# Patient Record
Sex: Female | Born: 1935
Health system: Southern US, Community
[De-identification: ages and names within clinical notes are randomized; demographics above are authoritative.]

## PROBLEM LIST (undated history)

## (undated) DIAGNOSIS — I10 Essential (primary) hypertension: Secondary | ICD-10-CM

## (undated) DIAGNOSIS — R609 Edema, unspecified: Secondary | ICD-10-CM

## (undated) DIAGNOSIS — I509 Heart failure, unspecified: Secondary | ICD-10-CM

## (undated) DIAGNOSIS — N189 Chronic kidney disease, unspecified: Secondary | ICD-10-CM

## (undated) DIAGNOSIS — E669 Obesity, unspecified: Secondary | ICD-10-CM

## (undated) DIAGNOSIS — R0609 Other forms of dyspnea: Secondary | ICD-10-CM

## (undated) DIAGNOSIS — E785 Hyperlipidemia, unspecified: Secondary | ICD-10-CM

## (undated) DIAGNOSIS — I4891 Unspecified atrial fibrillation: Secondary | ICD-10-CM

## (undated) DIAGNOSIS — K219 Gastro-esophageal reflux disease without esophagitis: Secondary | ICD-10-CM

## (undated) DIAGNOSIS — E119 Type 2 diabetes mellitus without complications: Secondary | ICD-10-CM

## (undated) DIAGNOSIS — R0989 Other specified symptoms and signs involving the circulatory and respiratory systems: Secondary | ICD-10-CM

## (undated) DIAGNOSIS — M25569 Pain in unspecified knee: Secondary | ICD-10-CM

## (undated) DIAGNOSIS — J45909 Unspecified asthma, uncomplicated: Secondary | ICD-10-CM

## (undated) HISTORY — DX: Hyperlipidemia, unspecified: E78.5

## (undated) HISTORY — DX: Unspecified atrial fibrillation: I48.91

## (undated) HISTORY — PX: ABDOMINAL HYSTERECTOMY: SHX81

## (undated) HISTORY — DX: Heart failure, unspecified: I50.9

## (undated) HISTORY — DX: Gastro-esophageal reflux disease without esophagitis: K21.9

## (undated) HISTORY — DX: Essential (primary) hypertension: I10

## (undated) HISTORY — DX: Edema, unspecified: R60.9

## (undated) HISTORY — DX: Chronic kidney disease, unspecified: N18.9

## (undated) HISTORY — DX: Unspecified asthma, uncomplicated: J45.909

## (undated) HISTORY — DX: Other specified symptoms and signs involving the circulatory and respiratory systems: R09.89

## (undated) HISTORY — DX: Type 2 diabetes mellitus without complications: E11.9

## (undated) HISTORY — DX: Obesity, unspecified: E66.9

## (undated) HISTORY — DX: Pain in unspecified knee: M25.569

## (undated) HISTORY — PX: KNEE SURGERY: SHX244

## (undated) HISTORY — DX: Other forms of dyspnea: R06.09

---

## 1998-02-16 ENCOUNTER — Encounter: Payer: Self-pay | Admitting: Pulmonary Disease

## 1998-12-14 ENCOUNTER — Encounter: Admission: RE | Admit: 1998-12-14 | Discharge: 1998-12-14 | Payer: Self-pay | Admitting: Family Medicine

## 1998-12-14 ENCOUNTER — Encounter: Payer: Self-pay | Admitting: Family Medicine

## 1999-01-04 ENCOUNTER — Other Ambulatory Visit: Admission: RE | Admit: 1999-01-04 | Discharge: 1999-01-04 | Payer: Self-pay | Admitting: Family Medicine

## 1999-03-29 ENCOUNTER — Encounter: Payer: Self-pay | Admitting: Pulmonary Disease

## 1999-03-29 ENCOUNTER — Ambulatory Visit: Admission: RE | Admit: 1999-03-29 | Discharge: 1999-03-29 | Payer: Self-pay | Admitting: Pulmonary Disease

## 1999-04-05 ENCOUNTER — Encounter: Payer: Self-pay | Admitting: Pulmonary Disease

## 2000-04-24 ENCOUNTER — Other Ambulatory Visit: Admission: RE | Admit: 2000-04-24 | Discharge: 2000-04-24 | Payer: Self-pay | Admitting: Family Medicine

## 2000-05-23 ENCOUNTER — Encounter (INDEPENDENT_AMBULATORY_CARE_PROVIDER_SITE_OTHER): Payer: Self-pay | Admitting: Specialist

## 2000-05-23 ENCOUNTER — Other Ambulatory Visit: Admission: RE | Admit: 2000-05-23 | Discharge: 2000-05-23 | Payer: Self-pay | Admitting: Obstetrics and Gynecology

## 2000-06-10 ENCOUNTER — Ambulatory Visit (HOSPITAL_COMMUNITY): Admission: RE | Admit: 2000-06-10 | Discharge: 2000-06-10 | Payer: Self-pay | Admitting: Obstetrics and Gynecology

## 2000-06-10 ENCOUNTER — Encounter (INDEPENDENT_AMBULATORY_CARE_PROVIDER_SITE_OTHER): Payer: Self-pay

## 2000-07-04 ENCOUNTER — Encounter: Payer: Self-pay | Admitting: Obstetrics and Gynecology

## 2000-07-09 ENCOUNTER — Inpatient Hospital Stay (HOSPITAL_COMMUNITY): Admission: RE | Admit: 2000-07-09 | Discharge: 2000-07-12 | Payer: Self-pay | Admitting: Obstetrics and Gynecology

## 2000-07-09 ENCOUNTER — Encounter (INDEPENDENT_AMBULATORY_CARE_PROVIDER_SITE_OTHER): Payer: Self-pay

## 2000-09-09 ENCOUNTER — Encounter: Admission: RE | Admit: 2000-09-09 | Discharge: 2000-09-09 | Payer: Self-pay | Admitting: General Surgery

## 2000-09-09 ENCOUNTER — Encounter: Payer: Self-pay | Admitting: General Surgery

## 2002-02-04 ENCOUNTER — Encounter: Payer: Self-pay | Admitting: Orthopaedic Surgery

## 2002-02-04 ENCOUNTER — Encounter: Payer: Self-pay | Admitting: Family Medicine

## 2002-02-04 ENCOUNTER — Encounter: Admission: RE | Admit: 2002-02-04 | Discharge: 2002-02-04 | Payer: Self-pay | Admitting: Orthopaedic Surgery

## 2002-02-06 ENCOUNTER — Ambulatory Visit (HOSPITAL_BASED_OUTPATIENT_CLINIC_OR_DEPARTMENT_OTHER): Admission: RE | Admit: 2002-02-06 | Discharge: 2002-02-06 | Payer: Self-pay | Admitting: Orthopaedic Surgery

## 2002-02-16 ENCOUNTER — Encounter: Admission: RE | Admit: 2002-02-16 | Discharge: 2002-05-17 | Payer: Self-pay | Admitting: Orthopaedic Surgery

## 2002-05-27 ENCOUNTER — Encounter: Admission: RE | Admit: 2002-05-27 | Discharge: 2002-06-10 | Payer: Self-pay | Admitting: Orthopaedic Surgery

## 2003-01-08 ENCOUNTER — Other Ambulatory Visit: Admission: RE | Admit: 2003-01-08 | Discharge: 2003-01-08 | Payer: Self-pay | Admitting: Obstetrics and Gynecology

## 2003-02-17 ENCOUNTER — Encounter: Payer: Self-pay | Admitting: Family Medicine

## 2003-03-18 ENCOUNTER — Inpatient Hospital Stay (HOSPITAL_COMMUNITY): Admission: RE | Admit: 2003-03-18 | Discharge: 2003-03-19 | Payer: Self-pay | Admitting: General Surgery

## 2004-07-18 ENCOUNTER — Ambulatory Visit (HOSPITAL_COMMUNITY): Admission: RE | Admit: 2004-07-18 | Discharge: 2004-07-18 | Payer: Self-pay | Admitting: Family Medicine

## 2004-07-18 ENCOUNTER — Encounter: Payer: Self-pay | Admitting: Family Medicine

## 2004-07-21 ENCOUNTER — Encounter: Payer: Self-pay | Admitting: Family Medicine

## 2006-07-02 ENCOUNTER — Encounter (INDEPENDENT_AMBULATORY_CARE_PROVIDER_SITE_OTHER): Payer: Self-pay | Admitting: *Deleted

## 2006-07-02 LAB — CONVERTED CEMR LAB
Glucose, Bld: 126 mg/dL
Hgb A1c MFr Bld: 6.7 %

## 2007-10-23 ENCOUNTER — Encounter: Payer: Self-pay | Admitting: Family Medicine

## 2008-03-24 ENCOUNTER — Ambulatory Visit: Payer: Self-pay | Admitting: Family Medicine

## 2008-03-24 DIAGNOSIS — K219 Gastro-esophageal reflux disease without esophagitis: Secondary | ICD-10-CM

## 2008-03-24 DIAGNOSIS — I499 Cardiac arrhythmia, unspecified: Secondary | ICD-10-CM | POA: Insufficient documentation

## 2008-03-24 DIAGNOSIS — I10 Essential (primary) hypertension: Secondary | ICD-10-CM | POA: Insufficient documentation

## 2008-03-24 DIAGNOSIS — J45909 Unspecified asthma, uncomplicated: Secondary | ICD-10-CM | POA: Insufficient documentation

## 2008-11-10 ENCOUNTER — Telehealth (INDEPENDENT_AMBULATORY_CARE_PROVIDER_SITE_OTHER): Payer: Self-pay | Admitting: *Deleted

## 2008-12-07 ENCOUNTER — Ambulatory Visit: Payer: Self-pay | Admitting: Family Medicine

## 2008-12-07 DIAGNOSIS — E669 Obesity, unspecified: Secondary | ICD-10-CM

## 2008-12-09 ENCOUNTER — Encounter (INDEPENDENT_AMBULATORY_CARE_PROVIDER_SITE_OTHER): Payer: Self-pay | Admitting: *Deleted

## 2008-12-09 ENCOUNTER — Telehealth (INDEPENDENT_AMBULATORY_CARE_PROVIDER_SITE_OTHER): Payer: Self-pay | Admitting: *Deleted

## 2008-12-09 LAB — CONVERTED CEMR LAB
ALT: 21 units/L (ref 0–35)
AST: 23 units/L (ref 0–37)
Albumin: 4.1 g/dL (ref 3.5–5.2)
Alkaline Phosphatase: 64 units/L (ref 39–117)
BUN: 33 mg/dL — ABNORMAL HIGH (ref 6–23)
Basophils Absolute: 0 10*3/uL (ref 0.0–0.1)
Basophils Relative: 0.2 % (ref 0.0–3.0)
Bilirubin, Direct: 0 mg/dL (ref 0.0–0.3)
CO2: 29 meq/L (ref 19–32)
Calcium: 9.2 mg/dL (ref 8.4–10.5)
Chloride: 105 meq/L (ref 96–112)
Cholesterol: 232 mg/dL — ABNORMAL HIGH (ref 0–200)
Creatinine, Ser: 1.2 mg/dL (ref 0.4–1.2)
Direct LDL: 168.9 mg/dL
Eosinophils Absolute: 0.1 10*3/uL (ref 0.0–0.7)
Eosinophils Relative: 1.4 % (ref 0.0–5.0)
GFR calc non Af Amer: 46.23 mL/min (ref >60)
Glucose, Bld: 126 mg/dL — ABNORMAL HIGH (ref 70–99)
HCT: 40.1 % (ref 36.0–46.0)
HDL: 43.2 mg/dL (ref >39.00)
Hemoglobin: 13.6 g/dL (ref 12.0–15.0)
Lymphocytes Relative: 18.7 % (ref 12.0–46.0)
Lymphs Abs: 1 10*3/uL (ref 0.7–4.0)
MCHC: 33.9 g/dL (ref 30.0–36.0)
MCV: 90.4 fL (ref 78.0–100.0)
Monocytes Absolute: 0.4 10*3/uL (ref 0.1–1.0)
Monocytes Relative: 7 % (ref 3.0–12.0)
Neutro Abs: 4.1 10*3/uL (ref 1.4–7.7)
Neutrophils Relative %: 72.7 % (ref 43.0–77.0)
Platelets: 166 10*3/uL (ref 150.0–400.0)
Potassium: 3.7 meq/L (ref 3.5–5.1)
RBC: 4.43 M/uL (ref 3.87–5.11)
RDW: 13.8 % (ref 11.5–14.6)
Sodium: 142 meq/L (ref 135–145)
TSH: 3.81 microintl units/mL (ref 0.35–5.50)
Total Bilirubin: 0.7 mg/dL (ref 0.3–1.2)
Total CHOL/HDL Ratio: 5
Total Protein: 7 g/dL (ref 6.0–8.3)
Triglycerides: 135 mg/dL (ref 0.0–149.0)
VLDL: 27 mg/dL (ref 0.0–40.0)
WBC: 5.6 10*3/uL (ref 4.5–10.5)

## 2008-12-21 ENCOUNTER — Encounter: Payer: Self-pay | Admitting: Family Medicine

## 2008-12-21 ENCOUNTER — Encounter: Payer: Self-pay | Admitting: Internal Medicine

## 2008-12-23 ENCOUNTER — Encounter (INDEPENDENT_AMBULATORY_CARE_PROVIDER_SITE_OTHER): Payer: Self-pay | Admitting: *Deleted

## 2009-01-12 ENCOUNTER — Ambulatory Visit: Payer: Self-pay | Admitting: Family

## 2009-01-12 DIAGNOSIS — E785 Hyperlipidemia, unspecified: Secondary | ICD-10-CM

## 2009-01-12 LAB — CONVERTED CEMR LAB: AST: 23 units/L (ref 0–37)

## 2009-01-13 ENCOUNTER — Encounter: Payer: Self-pay | Admitting: Family

## 2009-01-17 ENCOUNTER — Encounter (INDEPENDENT_AMBULATORY_CARE_PROVIDER_SITE_OTHER): Payer: Self-pay | Admitting: *Deleted

## 2009-02-16 ENCOUNTER — Ambulatory Visit: Payer: Self-pay | Admitting: Family

## 2009-02-16 DIAGNOSIS — R7309 Other abnormal glucose: Secondary | ICD-10-CM

## 2009-04-13 ENCOUNTER — Ambulatory Visit: Payer: Self-pay | Admitting: Family

## 2009-04-13 LAB — CONVERTED CEMR LAB
HDL: 51.9 mg/dL (ref >39.00)
Hgb A1c MFr Bld: 6.4 % (ref 4.6–6.5)
LDL Cholesterol: 78 mg/dL (ref 0–99)
Total CHOL/HDL Ratio: 3
VLDL: 19.6 mg/dL (ref 0.0–40.0)

## 2009-04-14 ENCOUNTER — Encounter: Payer: Self-pay | Admitting: Family

## 2009-04-20 ENCOUNTER — Ambulatory Visit: Payer: Self-pay | Admitting: Family

## 2009-07-20 ENCOUNTER — Ambulatory Visit: Payer: Self-pay | Admitting: Family Medicine

## 2009-07-20 DIAGNOSIS — M25569 Pain in unspecified knee: Secondary | ICD-10-CM | POA: Insufficient documentation

## 2009-07-20 DIAGNOSIS — R609 Edema, unspecified: Secondary | ICD-10-CM | POA: Insufficient documentation

## 2009-08-18 ENCOUNTER — Ambulatory Visit: Payer: Self-pay | Admitting: Family Medicine

## 2009-08-18 ENCOUNTER — Encounter (INDEPENDENT_AMBULATORY_CARE_PROVIDER_SITE_OTHER): Payer: Self-pay | Admitting: *Deleted

## 2009-08-19 ENCOUNTER — Ambulatory Visit: Payer: Self-pay | Admitting: Pulmonary Disease

## 2009-08-19 DIAGNOSIS — R0989 Other specified symptoms and signs involving the circulatory and respiratory systems: Secondary | ICD-10-CM

## 2009-08-19 DIAGNOSIS — R0609 Other forms of dyspnea: Secondary | ICD-10-CM

## 2009-09-21 ENCOUNTER — Ambulatory Visit: Payer: Self-pay | Admitting: Pulmonary Disease

## 2009-09-27 ENCOUNTER — Ambulatory Visit: Payer: Self-pay | Admitting: Pulmonary Disease

## 2009-09-27 DIAGNOSIS — J45909 Unspecified asthma, uncomplicated: Secondary | ICD-10-CM | POA: Insufficient documentation

## 2009-10-18 ENCOUNTER — Ambulatory Visit: Payer: Self-pay | Admitting: Pulmonary Disease

## 2009-10-19 ENCOUNTER — Ambulatory Visit: Payer: Self-pay | Admitting: Family Medicine

## 2009-10-20 LAB — CONVERTED CEMR LAB
ALT: 19 units/L (ref 0–35)
BUN: 27 mg/dL — ABNORMAL HIGH (ref 6–23)
Bilirubin, Direct: 0.1 mg/dL (ref 0.0–0.3)
Chloride: 107 meq/L (ref 96–112)
GFR calc non Af Amer: 52.09 mL/min (ref >60)
Glucose, Bld: 122 mg/dL — ABNORMAL HIGH (ref 70–99)
Potassium: 4.2 meq/L (ref 3.5–5.1)
Total Bilirubin: 0.7 mg/dL (ref 0.3–1.2)
VLDL: 17.8 mg/dL (ref 0.0–40.0)

## 2009-10-21 ENCOUNTER — Telehealth (INDEPENDENT_AMBULATORY_CARE_PROVIDER_SITE_OTHER): Payer: Self-pay | Admitting: *Deleted

## 2009-10-24 ENCOUNTER — Ambulatory Visit: Payer: Self-pay | Admitting: Family Medicine

## 2009-10-25 ENCOUNTER — Telehealth: Payer: Self-pay | Admitting: Family Medicine

## 2009-11-09 ENCOUNTER — Ambulatory Visit: Payer: Self-pay | Admitting: Family Medicine

## 2009-11-09 DIAGNOSIS — R0989 Other specified symptoms and signs involving the circulatory and respiratory systems: Secondary | ICD-10-CM

## 2009-11-09 DIAGNOSIS — Z8639 Personal history of other endocrine, nutritional and metabolic disease: Secondary | ICD-10-CM | POA: Insufficient documentation

## 2009-11-11 LAB — CONVERTED CEMR LAB: Microalb Creat Ratio: 0.2 mg/g (ref 0.0–30.0)

## 2009-12-08 ENCOUNTER — Telehealth (INDEPENDENT_AMBULATORY_CARE_PROVIDER_SITE_OTHER): Payer: Self-pay | Admitting: *Deleted

## 2010-01-11 ENCOUNTER — Ambulatory Visit: Payer: Self-pay | Admitting: Family Medicine

## 2010-01-16 ENCOUNTER — Telehealth (INDEPENDENT_AMBULATORY_CARE_PROVIDER_SITE_OTHER): Payer: Self-pay | Admitting: *Deleted

## 2010-01-16 LAB — CONVERTED CEMR LAB
Alkaline Phosphatase: 67 units/L (ref 39–117)
BUN: 28 mg/dL — ABNORMAL HIGH (ref 6–23)
Bilirubin, Direct: 0.2 mg/dL (ref 0.0–0.3)
CO2: 30 meq/L (ref 19–32)
Chloride: 103 meq/L (ref 96–112)
Creatinine, Ser: 1 mg/dL (ref 0.4–1.2)
Glucose, Bld: 117 mg/dL — ABNORMAL HIGH (ref 70–99)
Hgb A1c MFr Bld: 6.5 % (ref 4.6–6.5)

## 2010-01-17 ENCOUNTER — Encounter: Payer: Self-pay | Admitting: Family Medicine

## 2010-03-07 ENCOUNTER — Other Ambulatory Visit: Payer: Self-pay | Admitting: Family Medicine

## 2010-03-07 ENCOUNTER — Ambulatory Visit
Admission: RE | Admit: 2010-03-07 | Discharge: 2010-03-07 | Payer: Self-pay | Source: Home / Self Care | Attending: Family Medicine | Admitting: Family Medicine

## 2010-03-07 LAB — HEPATIC FUNCTION PANEL
ALT: 35 U/L (ref 0–35)
AST: 40 U/L — ABNORMAL HIGH (ref 0–37)
Albumin: 4 g/dL (ref 3.5–5.2)
Alkaline Phosphatase: 67 U/L (ref 39–117)
Bilirubin, Direct: 0.2 mg/dL (ref 0.0–0.3)
Total Bilirubin: 0.7 mg/dL (ref 0.3–1.2)
Total Protein: 7.1 g/dL (ref 6.0–8.3)

## 2010-03-28 NOTE — Miscellaneous (Signed)
Summary: Orders Update pft charges  Clinical Lists Changes  Orders: Added new Service order of Carbon Monoxide diffusing w/capacity (94720) - Signed Added new Service order of Lung Volumes (94240) - Signed Added new Service order of Spirometry (Pre & Post) (94060) - Signed 

## 2010-03-28 NOTE — Miscellaneous (Signed)
Summary: Orders Update  Clinical Lists Changes  Orders: Added new Test order of Carotid Duplex (Carotid Duplex) - Signed 

## 2010-03-28 NOTE — Assessment & Plan Note (Signed)
Summary: 2 mth fu/ns/kdc   Vital Signs:  Patient profile:   77 year old female Weight:      195.50 pounds BMI:     38.32 Temp:     98.2 degrees F oral Pulse rate:   64 / minute Pulse rhythm:   irregular Resp:     16 per minute BP sitting:   140 / 70  (left arm) Cuff size:   large  Vitals Entered By: Mervin Kung CMA (April 20, 2009 10:04 AM) CC: 2 month follow up on labs Comments Larey Seat Tuesday on her left side. Pt has not started her calcum yet.   CC:  2 month follow up on labs.  History of Present Illness: Ms Cooperman is a 77 year old female who presents today following a fall on Tuesday.  Slipped off stair and hit her left shoulder.  Noted that she also strained her left knee.  Tuesday, "flesh hurt" on both of legs.  Thursday needed to use her cane to ambulate, on friday she noted some improvement in pain.  Now back to her baseline ambulation.  Denies current pain.    Preventive Screening-Counseling & Management  Alcohol-Tobacco     Smoking Status: never  Allergies (verified): No Known Drug Allergies  Physical Exam  General:  overweight white female, NAD Lungs:  Normal respiratory effort, chest expands symmetrically. Lungs are clear to auscultation, no crackles or wheezes. Heart:  Normal rate and regular rhythm. S1 and S2 normal without gallop, murmur, click, rub or other extra sounds. Msk:  Normal ROM both knees and left shoulder  Extremities:  mild swelling of left knee (baseline per patient) Neurologic:  gait normal.     Impression & Recommendations:  Problem # 1:  ACCIDENTAL FALL ON OR FROM OTHER STAIRS OR STEPS (ICD-E880.9) Assessment Improved Patient appears to have recovered nicely from this fall, no pain currently.  Monitor.    Problem # 2:  HYPERLIPIDEMIA (ICD-272.4) Assessment: Improved Lipids stable, continue to monitor  Her updated medication list for this problem includes:    Pravastatin Sodium 40 Mg Tabs (Pravastatin sodium) .Marland Kitchen... Take one  tablet at bedtime  Complete Medication List: 1)  Felodipine 10 Mg Xr24h-tab (Felodipine) .... One tablet by mouth daily 2)  Hydrochlorothiazide 50 Mg Tabs (Hydrochlorothiazide) .... One tablet by mouth daily 3)  Ventolin Hfa 108 (90 Base) Mcg/act Aers (Albuterol sulfate) .... 2 puffs every 4 hours as needed for cough or wheezing 4)  Pravastatin Sodium 40 Mg Tabs (Pravastatin sodium) .... Take one tablet at bedtime 5)  Centrum Silver Ultra Womens Tabs (Multiple vitamins-minerals) .... One tablet by mouth daily 6)  Caltrate 600+d Plus 600-400 Mg-unit Tabs (Calcium carbonate-vit d-min) .... One tablet by mouth two times a day 7)  Fish Oil 1000 Mg Caps (Omega-3 fatty acids) .... Take 1 tablet by mouth once a day 8)  Cinnamon 500 Mg Caps (Cinnamon) .... Take one every morning and one every evening  Patient Instructions: 1)  Please call if you develop recurrent joint pain.   2)  Follow up in 3 months  Current Allergies (reviewed today): No known allergies

## 2010-03-28 NOTE — Progress Notes (Signed)
Summary: labs  Phone Note Outgoing Call   Call placed by: Doristine Devoid CMA,  October 25, 2009 1:57 PM Call placed to: Patient Summary of Call: pt is officially diabetic based on labs.  should pay close attention to diet, no need for meds at this time.  given new dx pt's LDL goal is <70.  should switch from pravastatin to simvastatin 40mg  nightly.  recheck LFTs in 6-8 weeks.  should schedule an office visit to discuss new dx.  Follow-up for Phone Call        left message on machine .Marland KitchenMarland KitchenDoristine Devoid CMA  October 25, 2009 1:57 PM   [email or web address] Barban  November 04, 2009 11:30 AM  Additional Follow-up for Phone Call Additional follow up Details #1::        Patient notified and will come to discuss on Monday. Additional Follow-up by: Lucious Groves CMA,  November 04, 2009 2:51 PM    New/Updated Medications: SIMVASTATIN 40 MG TABS (SIMVASTATIN) 1 by mouth at bedtime Prescriptions: SIMVASTATIN 40 MG TABS (SIMVASTATIN) 1 by mouth at bedtime  #30 x 0   Entered by:   Lucious Groves CMA   Authorized by:   Neena Rhymes MD   Signed by:   Lucious Groves CMA on 11/04/2009   Method used:   Electronically to        [facility]* (retail)       [address] [place], [place]  [address]       Ph: [phone]       Fax: [phone]   RxID:   [id number]

## 2010-03-28 NOTE — Progress Notes (Signed)
Summary: labs-  Phone Note Outgoing Call   Call placed by: Doristine Devoid CMA,  January 16, 2010 3:05 PM Call placed to: Patient Summary of Call: A1C remains stable  AST/ALT have increased since switching to Simvastatin.  please decrease simvastatin to 1/2 tab daily and recheck LFTs in 4 weeks. (lab visit only)  Follow-up for Phone Call        left message on machine ........Marland KitchenDoristine Devoid CMA  January 16, 2010 3:05 PM   left message on machine ........Marland KitchenDoristine Devoid CMA  January 17, 2010 2:17 PM   spoke w/ patient aware of labs and will decrease simvastatin .........Marland KitchenDoristine Devoid CMA  January 17, 2010 4:41 PM

## 2010-03-28 NOTE — Letter (Signed)
Summary: Primary Care Consult Scheduled Letter  [facility]  [address] [place], [place] [address]   Phone: [phone]  Fax: [phone]      08/18/2009 MRN: [id number]  [facility] [address] [place], [place]  [address]    Dear Ms. Peavler,    Pulmonary Referral Dr. Shelle Iron 08-19-09 at 11:30am [phone] if need to cancel or reshcedule. [address], [address]nd floor of [address].  Orthopedic Referral Dr. Cleophas Dunker 08-22-09 @ 2:40pm [phone] [address] [place] They are out of network, might be larger out-of-pocket.    Thank you,  Patient Care Coordinator  at [facility]

## 2010-03-28 NOTE — Assessment & Plan Note (Signed)
Summary: 3 mth fu/kdc   Vital Signs:  Patient profile:   77 year old female Weight:      194 pounds Pulse rate:   68 / minute BP sitting:   144 / 78  (left arm)  Vitals Entered By: Doristine Devoid (Jul 20, 2009 9:54 AM) CC: f/u using inhaler more and L leg swellen    History of Present Illness: 77 yo woman here today for  1) L leg swelling- started when pt had staph infxn 2006.  had multiple surgeries and subsequent huge wound.  has had edema w/ standing since- no problems when walking or lying down.  2) L medial knee pain- pain w/ twisting knee, able to keep leg straight.  some swelling at site of previous surgery (2004).  Dr Dwain Sarna at [facility].  pain described as 'enough to make you see stars'.  occurs intermittantly 'for years'.  3) HTN- slightly elevated today.  taking meds w/out difficulty.  No CP, HAs, visual changes, edema, SOB.  4) RAD- has been using inhaler more frequently, particularly during allergy season.  some SOB w/ exertion recently.  pt has not had PFTs in  ~10 yrs- at that time was 'borderline' asthmatic.  Current Medications (verified): 1)  Felodipine 10 Mg Xr24h-Tab (Felodipine) .... One Tablet By Mouth Daily 2)  Hydrochlorothiazide 50 Mg Tabs (Hydrochlorothiazide) .... One Tablet By Mouth Daily 3)  Ventolin Hfa 108 (90 Base) Mcg/act Aers (Albuterol Sulfate) .... 2 Puffs Every 4 Hours As Needed For Cough or Wheezing 4)  Pravastatin Sodium 40 Mg Tabs (Pravastatin Sodium) .... Take One Tablet At Bedtime 5)  Centrum Silver Ultra Womens  Tabs (Multiple Vitamins-Minerals) .... One Tablet By Mouth Daily 6)  Caltrate 600+d Plus 600-400 Mg-Unit Tabs (Calcium Carbonate-Vit D-Min) .... One Tablet By Mouth Two Times A Day 7)  Fish Oil 1000 Mg Caps (Omega-3 Fatty Acids) .... Take 1 Tablet By Mouth Once A Day 8)  Cinnamon 500 Mg Caps (Cinnamon) .... Take One Every Morning and One Every Evening  Allergies (verified): No Known Drug Allergies  Past History:  Past  Medical History: Last updated: 03/24/2008 Hypertension hx of MRSA infxn bronchial asthma  Past Surgical History: Last updated: 12/07/2008 Hysterectomy, BSO Hernia repair Left knee scope Absess removal- Left thigh seroma  Review of Systems      See HPI  Physical Exam  General:  overweight white female, NAD Lungs:  Normal respiratory effort, chest expands symmetrically. Lungs are clear to auscultation, no crackles or wheezes. Heart:  Normal rate and regular rhythm. S1 and S2 normal without gallop, murmur, click, rub or other extra sounds. Msk:  + swelling over L medial knee, midly TTP.  good flexion/extension Pulses:  +2 carotid, radial, DP Extremities:  +1 edema of LLE trace edema on R   Impression & Recommendations:  Problem # 1:  HYPERTENSION (ICD-401.9) Assessment Unchanged BP not at goal today but pt very worked up in office.  no change in meds at this time but will have her return for nurse visit.  if still elevated will need to adjust meds. Her updated medication list for this problem includes:    Felodipine 10 Mg Xr24h-tab (Felodipine) ..... One tablet by mouth daily    Hydrochlorothiazide 50 Mg Tabs (Hydrochlorothiazide) ..... One tablet by mouth daily  Problem # 2:  EDEMA (ICD-782.3) Assessment: New pt's LLE edema is likely due to damaged venous and lymphatic return after her leg surgery and wound.  given that pt has decreased swelling w/ ambulation  and elevation further supports this.  already on diuretic and wearing support hose.  nothing to do at this time.  pt feels better now that she understands the mechanism. Her updated medication list for this problem includes:    Hydrochlorothiazide 50 Mg Tabs (Hydrochlorothiazide) ..... One tablet by mouth daily  Problem # 3:  KNEE PAIN (TDV-761.60) Assessment: New pt w/ hx of arthoscopic surgery on L knee.  never followed up as she was supposed to.  now having pain and tenderness over the site.  pain w/ twisting of  knee.  will refer back to ortho for complete evaluation and tx.  in interim suggested tylenol or NSAIDs as needed for pain and swelling.  Pt expresses understanding and is in agreement w/ this plan.  Problem # 4:  REACTIVE AIRWAY DISEASE (ICD-493.90) Assessment: Deteriorated  pt reports needing to use inhaler more than usual.  this is most noteable during allergy season.  has not had f/u PFTs in  ~10 yrs.  increased SOB somewhat concerning.  reviewed that if lung fxn is normal will need cards evaluation.  pt aware and in agreement. Her updated medication list for this problem includes:    Ventolin Hfa 108 (90 Base) Mcg/act Aers (Albuterol sulfate) .Marland Kitchen... 2 puffs every 4 hours as needed for cough or wheezing  Orders: Prescription Created Electronically [id number])  Complete Medication List: 1)  Felodipine 10 Mg Xr24h-tab (Felodipine) .... One tablet by mouth daily 2)  Hydrochlorothiazide 50 Mg Tabs (Hydrochlorothiazide) .... One tablet by mouth daily 3)  Ventolin Hfa 108 (90 Base) Mcg/act Aers (Albuterol sulfate) .... 2 puffs every 4 hours as needed for cough or wheezing 4)  Pravastatin Sodium 40 Mg Tabs (Pravastatin sodium) .... Take one tablet at bedtime 5)  Centrum Silver Ultra Womens Tabs (Multiple vitamins-minerals) .... One tablet by mouth daily 6)  Caltrate 600+d Plus 600-400 Mg-unit Tabs (Calcium carbonate-vit d-min) .... One tablet by mouth two times a day 7)  Fish Oil 1000 Mg Caps (Omega-3 fatty acids) .... Take 1 tablet by mouth once a day 8)  Cinnamon 500 Mg Caps (Cinnamon) .... Take one every morning and one every evening  Patient Instructions: 1)  Please schedule a nurse visit in 3-4 weeks to recheck blood pressure 2)  Please schedule a follow-up appointment in 3 months for cholesterol- Do not eat before this appt. 3)  Someone will call you with your pulmonary and ortho appts 4)  Tylenol or ibuprofen for your knee pain 5)  Elevate your leg as much as possible to limit the  swelling 6)  Call with any questions or concerns 7)  Hang in there!!  Prescriptions: VENTOLIN HFA 108 (90 BASE) MCG/ACT AERS (ALBUTEROL SULFATE) 2 puffs every 4 hours as needed for cough or wheezing  #1 x 3   Entered and Authorized by:   Neena Rhymes MD   Signed by:   Neena Rhymes MD on 07/20/2009   Method used:   Electronically to        [facility]* (retail)       [address] [place], [place]  [address]       Ph: [phone]       Fax: [phone]   RxID:   [id number]   Appended Document: 3 mth fu/kdc    Clinical Lists Changes  Orders: Added new Referral order of Orthopedic Referral (Ortho) - Signed Added new Referral order of Pulmonary  Referral (Pulmonary) - Signed

## 2010-03-28 NOTE — Progress Notes (Signed)
Summary: [facility]   Imported By: Sherian Rein 08/25/2009 09:07:24  _____________________________________________________________________  External Attachment:    Type:   Image     Comment:   External Document

## 2010-03-28 NOTE — Letter (Signed)
   [facility] [address] [place], [place] [address] [phone]    April 14, 2009   [facility] [address] [place], [place] [address]  RE:  LAB RESULTS  Dear  Ms. Buchbinder,  The following is an interpretation of your most recent lab tests.  Please take note of any instructions provided or changes to medications that have resulted from your lab work.  LIPID PANEL:  Abnormal - schedule a follow-up appointment Triglyceride: 98.0   Cholesterol: 149   LDL: 78   HDL: 51.90   Chol/HDL%:  3   DIABETIC STUDIES:  Fair - schedule a follow-up appointment Blood Glucose: 126   HgbA1C: 6.4     Please keep your follow up appointment on 2/23 and we can discuss your sugar and cholesterol at that time.   Sincerely Yours,    Lemont Fillers FNP

## 2010-03-28 NOTE — Progress Notes (Signed)
Summary: labs   Phone Note Outgoing Call   Call placed by: Doristine Devoid CMA,  October 21, 2009 4:31 PM Call placed to: Patient Summary of Call: LDL is better than previous.  please add A1C to pt's labs as her glucose is at high end of pre-diabetic range.  Follow-up for Phone Call        spoke w/ patient aware of labs and appt scheduled to check a1c .........Marland KitchenDoristine Devoid CMA  October 21, 2009 4:36 PM

## 2010-03-28 NOTE — Assessment & Plan Note (Signed)
Summary: rov for airflow obstruction, cough, throat clearing.   Copy to:  Neena Rhymes Primary Provider/Referring Provider:  Neena Rhymes MD  CC:  Pt is here for a 3 week f/u appt.  Pt states her breathing has improved since starting on Qvar.  Pt states she has noticed occ non-productive cough and "phlegm gets stuck in throat."  .  History of Present Illness: The pt comes in today for f/u of her mild airflow obstruction and cough with frequent throat clearing.  At the last visit, it was unclear if she had latent asthma vs RAD from upper airway issues.  The other possibility was senile emphysema.  She was started on qvar, and feels that her breathing is better.  However, she is still having symptoms of upper airway irritation with cough and throat clearing.  She is also having postnasal drip, but denies reflux symptoms.  Current Medications (verified): 1)  Felodipine 10 Mg Xr24h-Tab (Felodipine) .... One Tablet By Mouth Daily 2)  Hydrochlorothiazide 50 Mg Tabs (Hydrochlorothiazide) .... One Tablet By Mouth Daily 3)  Ventolin Hfa 108 (90 Base) Mcg/act Aers (Albuterol Sulfate) .... 2 Puffs Every 4 Hours As Needed For Cough or Wheezing 4)  Pravastatin Sodium 40 Mg Tabs (Pravastatin Sodium) .... Take One Tablet At Bedtime 5)  Centrum Silver Ultra Womens  Tabs (Multiple Vitamins-Minerals) .... One Tablet By Mouth Daily 6)  Caltrate 600+d Plus 600-400 Mg-Unit Tabs (Calcium Carbonate-Vit D-Min) .... One Tablet By Mouth Two Times A Day 7)  Fish Oil 1000 Mg Caps (Omega-3 Fatty Acids) .... Take 1 Tablet By Mouth Once A Day 8)  Cinnamon 500 Mg Caps (Cinnamon) .... Take One Every Morning and One Every Evening  Allergies (verified): No Known Drug Allergies  Review of Systems       The patient complains of shortness of breath with activity, non-productive cough, difficulty swallowing, and hand/feet swelling.  The patient denies shortness of breath at rest, productive cough, coughing up blood,  chest pain, irregular heartbeats, acid heartburn, indigestion, loss of appetite, weight change, abdominal pain, sore throat, tooth/dental problems, headaches, nasal congestion/difficulty breathing through nose, sneezing, itching, ear ache, anxiety, depression, joint stiffness or pain, rash, change in color of mucus, and fever.    Vital Signs:  Patient profile:   77 year old female Height:      60 inches Weight:      198 pounds BMI:     38.81 O2 Sat:      93 % on Room air Temp:     98.3 degrees F oral Pulse rate:   79 / minute BP sitting:   140 / 70  (left arm) Cuff size:   large  Vitals Entered By: Arman Filter LPN (October 18, 2009 9:16 AM)  O2 Flow:  Room air CC: Pt is here for a 3 week f/u appt.  Pt states her breathing has improved since starting on Qvar.  Pt states she has noticed occ non-productive cough and "phlegm gets stuck in throat."   Comments Medications reviewed with patient Arman Filter LPN  October 18, 2009 9:16 AM    Physical Exam  General:  obese female in nad Nose:  no obvious drainage or purulence Lungs:  totally clear to auscultationr Heart:  rrr Extremities:  +edema noted, no cyanosis  Neurologic:  alert and oriented, moves all 4.   Impression & Recommendations:  Problem # 1:  INTRINSIC ASTHMA, UNSPECIFIED (ICD-493.10) The pt feels her breathing is improved on the qvar.  Again, it is  unclear to me if she has true asthma vs RAD from upper airway issues such as postnasal drip and refux.  Although her breathing is better, her cough and throat clearing are still present (but better also).  I would like to continue on ICS, and treat her as well for reflux and postnasal drip.  The jury is still out whether she really needs to be on qvar.  The pt will give me some feedback in a few weeks.  Medications Added to Medication List This Visit: 1)  Qvar 80 Mcg/act Aers (Beclomethasone dipropionate) .... Two  puffs twice daily  Other Orders: Est. Patient Level III  (46962)  Patient Instructions: 1)  continue on qvar for now as directed 2)  put fish oil capsules in freezer 3)  trial of aciphex 20mg  one each am.  If works well, will call in a generic equivalent 4)  chlorpheniramine 8mg  over the counter, one at bedtime (or take two of the 4mg  tabs if that is all you can find.) 5)  if doing well, folllowup with me in 3mos. 6)  call and give me some feedback in a few weeks to let me know how things are going.

## 2010-03-28 NOTE — Assessment & Plan Note (Signed)
Summary: consult for dyspnea   Copy to:  Neena Rhymes Primary Provider/Referring Provider:  Neena Rhymes MD  CC:  Pulmonary Consult.  History of Present Illness: The pt is a 77y/o female who I have been asked to see for dyspnea.  The pt states that she has been sob for years, but is not aware of whether it may be getting worse.  She tells me she thinks it is due to a lack of exercise with sedentary lifestyle.  She has no sob at rest, but states that she has a less than one block doe at moderate pace on flat ground.  She sometimes can get winded bringing groceries in from the car.  She denies any chronic cough or mucus, but does have some chronic LE edema.  She notes a dry cough at times when around strong odors.  She has not had recent cxr or pfts, but has seen a pulmonologist in our office in 2001.  Her old chart is in a warehouse, and efforts are being made to get it.  The pt states that her weight is stable from last year.  Medications Prior to Update: 1)  Felodipine 10 Mg Xr24h-Tab (Felodipine) .... One Tablet By Mouth Daily 2)  Hydrochlorothiazide 50 Mg Tabs (Hydrochlorothiazide) .... One Tablet By Mouth Daily 3)  Ventolin Hfa 108 (90 Base) Mcg/act Aers (Albuterol Sulfate) .... 2 Puffs Every 4 Hours As Needed For Cough or Wheezing 4)  Pravastatin Sodium 40 Mg Tabs (Pravastatin Sodium) .... Take One Tablet At Bedtime 5)  Centrum Silver Ultra Womens  Tabs (Multiple Vitamins-Minerals) .... One Tablet By Mouth Daily 6)  Caltrate 600+d Plus 600-400 Mg-Unit Tabs (Calcium Carbonate-Vit D-Min) .... One Tablet By Mouth Two Times A Day 7)  Fish Oil 1000 Mg Caps (Omega-3 Fatty Acids) .... Take 1 Tablet By Mouth Once A Day 8)  Cinnamon 500 Mg Caps (Cinnamon) .... Take One Every Morning and One Every Evening  Allergies (verified): No Known Drug Allergies  Past History:  Past Medical History: Hypertension hx of MRSA infxn  Past Surgical History: Hysterectomy, BSO  2000s Hernia  repair Left knee scope Absess removal- Left thigh seroma  Family History: Reviewed history from 03/24/2008 and no changes required. CAD-mother MI, also had allergies HTN-mother,father DM-no COLON CA-no STROKE-no BREAST CA-no father with prostate cancer  Social History: Reviewed history from 03/24/2008 and no changes required. pt is divorced and lives alone pt has children. pt has never smoked.   works at [employer] no tobacco, ETOH.  Review of Systems       The patient complains of shortness of breath with activity, sneezing, and hand/feet swelling.  The patient denies shortness of breath at rest, productive cough, non-productive cough, coughing up blood, chest pain, irregular heartbeats, acid heartburn, indigestion, loss of appetite, weight change, abdominal pain, difficulty swallowing, sore throat, tooth/dental problems, headaches, nasal congestion/difficulty breathing through nose, itching, ear ache, anxiety, depression, joint stiffness or pain, rash, change in color of mucus, and fever.    Vital Signs:  Patient profile:   77 year old female Height:      60 inches Weight:      192 pounds BMI:     37.63 O2 Sat:      96 % on Room air Temp:     98.3 degrees F oral Pulse rate:   82 / minute BP sitting:   130 / 78  (left arm) Cuff size:   regular  Vitals Entered By: Arman Filter LPN (August 19, 2009 11:51 AM)  O2 Flow:  Room air CC: Pulmonary Consult Comments Medications reviewed with patient Arman Filter LPN  August 19, 2009 12:02 PM    Physical Exam  General:  obese female in nad Eyes:  PERRLA and EOMI.   Nose:  patent without discharge Mouth:  clear, no exudates or lesions. Neck:  no jvd, tmg, LN Lungs:  clear to auscultation, no wheezing or rhonchi Heart:  rrr, 3/6 sem Abdomen:  soft and nontender, bs+ Extremities:  1+ edema on left, trace on right.  pulses intact distally no cyanosis Neurologic:  alert and oriented, moves all 4.   Impression &  Recommendations:  Problem # 1:  DYSPNEA ON EXERTION (ICD-786.09) the pt has chronic doe that I suspect is multifactorial.  She has definite issues with obesity and deconditioning, but it is unclear if she has pulmonary or cardiac issues.  I suspect she does not have asthma or RAD, but will await results of pfts.  Will also need to keep chronic VTE in mind as well.  We have ordered her old chart out of the warehouse, and will give [place] some idea about her previous evaluations.  By her description, I think her "attacks" are more of an upper airway issue such as VCD.  She also has longstanding htn with murmur, and may have diastolic dysfunction.  Other Orders: Consultation Level IV (36644) Pulmonary Referral (Pulmonary) T-2 View CXR (71020TC)  Patient Instructions: 1)  will schedule for breathing tests to evaluate your shortness of breath. 2)  will check cxr today, and will call you with results. 3)  will arrange followup once results are available.  Appended Document: consult for dyspnea megan, pt needs ov to review her pfts.  Appended Document: consult for dyspnea ATC pt at home #.  NA and unable to leave message.  Will try back later.   Appended Document: consult for dyspnea Called and spoke with pt.  informed pt she needs ov with [place] to discuss sleep study results. pt states she will have to check her work schedule and call me back.    Appended Document: consult for dyspnea pt scheduled to see [facility] 09-27-2009 at 9:15am

## 2010-03-28 NOTE — Assessment & Plan Note (Signed)
Summary: rov to review pfts.   Copy to:  Neena Rhymes Primary Provider/Referring Provider:  Neena Rhymes MD  CC:  Follow up to discuss PFT results. .  History of Present Illness: the pt comes in today for f/u of her recent pfts.  She was found to have mild airflow obstruction, but no response to bronchodilators.  She had no restriction, but did have mild airtrapping.  Her DLCO was normal as well.  I have gone over the study in detail with her, and answered all of her questions.  Current Medications (verified): 1)  Felodipine 10 Mg Xr24h-Tab (Felodipine) .... One Tablet By Mouth Daily 2)  Hydrochlorothiazide 50 Mg Tabs (Hydrochlorothiazide) .... One Tablet By Mouth Daily 3)  Ventolin Hfa 108 (90 Base) Mcg/act Aers (Albuterol Sulfate) .... 2 Puffs Every 4 Hours As Needed For Cough or Wheezing 4)  Pravastatin Sodium 40 Mg Tabs (Pravastatin Sodium) .... Take One Tablet At Bedtime 5)  Centrum Silver Ultra Womens  Tabs (Multiple Vitamins-Minerals) .... One Tablet By Mouth Daily 6)  Caltrate 600+d Plus 600-400 Mg-Unit Tabs (Calcium Carbonate-Vit D-Min) .... One Tablet By Mouth Two Times A Day 7)  Fish Oil 1000 Mg Caps (Omega-3 Fatty Acids) .... Take 1 Tablet By Mouth Once A Day 8)  Cinnamon 500 Mg Caps (Cinnamon) .... Take One Every Morning and One Every Evening  Allergies (verified): No Known Drug Allergies  Past History:  Past medical, surgical, family and social histories (including risk factors) reviewed, and no changes noted (except as noted below).  Past Medical History: Reviewed history from 08/19/2009 and no changes required. Hypertension hx of MRSA infxn  Past Surgical History: Reviewed history from 08/19/2009 and no changes required. Hysterectomy, BSO  2000s Hernia repair Left knee scope Absess removal- Left thigh seroma  Family History: Reviewed history from 08/19/2009 and no changes required. CAD-mother MI, also had allergies HTN-mother,father DM-no COLON  CA-no STROKE-no BREAST CA-no father with prostate cancer  Social History: Reviewed history from 08/19/2009 and no changes required. pt is divorced and lives alone pt has children. pt has never smoked.   works at [employer] no tobacco, ETOH.  Review of Systems       The patient complains of shortness of breath with activity, non-productive cough, and hand/feet swelling.  The patient denies shortness of breath at rest, productive cough, coughing up blood, chest pain, irregular heartbeats, acid heartburn, indigestion, loss of appetite, weight change, abdominal pain, difficulty swallowing, sore throat, tooth/dental problems, headaches, nasal congestion/difficulty breathing through nose, sneezing, itching, ear ache, anxiety, depression, joint stiffness or pain, rash, change in color of mucus, and fever.    Vital Signs:  Patient profile:   77 year old female Height:      60 inches Weight:      197.13 pounds BMI:     38.64 O2 Sat:      96 % on Room air Temp:     98.0 degrees F oral Pulse rate:   72 / minute BP sitting:   140 / 78  (right arm) Cuff size:   large  Vitals Entered By: Gweneth Dimitri RN (September 27, 2009 9:29 AM)  O2 Flow:  Room air CC: Follow up to discuss PFT results.  Comments Medications reviewed with patient Daytime contact number verified with patient. Gweneth Dimitri RN  September 27, 2009 9:31 AM    Physical Exam  General:  obese female in nad Nose:  no drainage or purulent Lungs:  totally clear to auscultation  Heart:  rrr Extremities:  1-2+ edema bilat, no cyanosis Neurologic:  alert and oriented, moves all 4.   Impression & Recommendations:  Problem # 1:  INTRINSIC ASTHMA, UNSPECIFIED (ICD-493.10) the pt does have very mild airflow obstruction on pfts today, and this could be due to senile emphysema or possible asthma.  I have also seen reactive airways secondary to reflux or sinus issues cause very mild obstruction as well.  She clearly has a component of  upper airway dysfunction (history is classic), but denies ongoing reflux or sinus issues.  Will have to keep this in mind.  At this point, would like to treat emperically for asthma with an ICS, and see how she responds.  Problem # 2:  DYSPNEA ON EXERTION (ICD-786.09)  the pt has chronic doe, but it is unclear how much her mild airflow obstruction is contributing to this.  She is also overweight and deconditioned, and has underlying htn that may predispose her to diastolic dysfunction.  Wil also need to keep in mind chronic vte.  Other Orders: Est. Patient Level III (16109)  Patient Instructions: 1)  will start on qvar  2 inhalations in am and pm for next 3 weeks....keep mouth rinsed well 2)  can continue using ventolin for emergencies only 3)  if your throat symptoms continue, may have to consider treating for reflux or postnasal drip as a trial. 4)  followup with me in 3 weeks to check on your response to the new inhaler.

## 2010-03-28 NOTE — Assessment & Plan Note (Signed)
Summary: 3 MONTH FOLLOWUP W/FASTING LABS////SPH   Vital Signs:  Patient profile:   77 year old female Weight:      197 pounds O2 Sat:      95 % on Room air Pulse rate:   74 / minute BP sitting:   130 / 66  (left arm)  Vitals Entered By: Doristine Devoid CMA (October 19, 2009 9:34 AM)  O2 Flow:  Room air CC: ROA AND LABS    History of Present Illness: 77 yo woman here today for f/u on  1) hyperlipidemia- doing well on Pravastatin.  denies N/V, abd pain, myalgias  2) HTN- adequate control.  no CP, HAs, visual changes.  + SOB (see below).  chronic edema.  3) asthma- pt frustrated w/ lack of answers regarding cause, PND vs GERD vs actual asthma.  was told to start Aciphex and chlorpheniramine in addition to QVAR.  isn't sure if she's taking all these meds how to know which is improving her sxs.  reports SOB improved on QVAR but doesn't want to take the med if she doesn't need it.  will still have intermittant wheezing.  Problems Prior to Update: 1)  Intrinsic Asthma, Unspecified  (ICD-493.10) 2)  Dyspnea On Exertion  (ICD-786.09) 3)  Edema  (ICD-782.3) 4)  Knee Pain  (ICD-719.46) 5)  Accidental Fall On or From Other Stairs or Steps  (ICD-E880.9) 6)  Hyperlipidemia  (ICD-272.4) 7)  Hyperglycemia, Borderline  (ICD-790.29) 8)  Other and Unspecified Hyperlipidemia  (ICD-272.4) 9)  Physical Examination  (ICD-V70.0) 10)  Obesity  (ICD-278.00) 11)  Gerd  (ICD-530.81) 12)  Irregular Heart Rate  (ICD-427.9) 13)  Reactive Airway Disease  (ICD-493.90) 14)  Hypertension  (ICD-401.9)  Current Medications (verified): 1)  Felodipine 10 Mg Xr24h-Tab (Felodipine) .... One Tablet By Mouth Daily 2)  Hydrochlorothiazide 50 Mg Tabs (Hydrochlorothiazide) .... One Tablet By Mouth Daily 3)  Ventolin Hfa 108 (90 Base) Mcg/act Aers (Albuterol Sulfate) .... 2 Puffs Every 4 Hours As Needed For Cough or Wheezing 4)  Pravastatin Sodium 40 Mg Tabs (Pravastatin Sodium) .... Take One Tablet At Bedtime 5)   Centrum Silver Ultra Womens  Tabs (Multiple Vitamins-Minerals) .... One Tablet By Mouth Daily 6)  Caltrate 600+d Plus 600-400 Mg-Unit Tabs (Calcium Carbonate-Vit D-Min) .... One Tablet By Mouth Two Times A Day 7)  Fish Oil 1000 Mg Caps (Omega-3 Fatty Acids) .... Take 1 Tablet By Mouth Once A Day 8)  Cinnamon 500 Mg Caps (Cinnamon) .... Take One Every Morning and One Every Evening 9)  Qvar 80 Mcg/act  Aers (Beclomethasone Dipropionate) .... Two  Puffs Twice Daily  Allergies (verified): No Known Drug Allergies  Past History:  Social History: Last updated: 08/19/2009 pt is divorced and lives alone pt has children. pt has never smoked.   works at [employer] no tobacco, ETOH.  Review of Systems      See HPI  Physical Exam  General:  overweight white female, NAD Neck:  No deformities, masses, or tenderness noted. Lungs:  Normal respiratory effort, chest expands symmetrically. Lungs are clear to auscultation, no crackles or wheezes. Heart:  Normal rate and regular rhythm. S1 and S2 normal without gallop, murmur, click, rub or other extra sounds. Pulses:  +2 carotid, radial, DP Extremities:  +1 edema of LLE trace edema on R Psych:  Cognition and judgment appear intact. Alert and cooperative with normal attention span and concentration. No apparent delusions, illusions, hallucinations   Impression & Recommendations:  Problem # 1:  HYPERTENSION (  ICD-401.9) Assessment Unchanged BP adequately controlled.  asymptomatic.  check labs.  no med changes at this time. Her updated medication list for this problem includes:    Felodipine 10 Mg Xr24h-tab (Felodipine) ..... One tablet by mouth daily    Hydrochlorothiazide 50 Mg Tabs (Hydrochlorothiazide) ..... One tablet by mouth daily  Orders: TLB-BMP (Basic Metabolic Panel-BMET) (80048-METABOL)  Problem # 2:  HYPERLIPIDEMIA (ICD-272.4) Assessment: Unchanged due for labs.  adjust meds as needed. Her updated medication list for this  problem includes:    Pravastatin Sodium 40 Mg Tabs (Pravastatin sodium) .Marland Kitchen... Take one tablet at bedtime  Orders: Venipuncture (16109) TLB-Lipid Panel (80061-LIPID) TLB-Hepatic/Liver Function Pnl (80076-HEPATIC) Specimen Handling (60454)  Problem # 3:  INTRINSIC ASTHMA, UNSPECIFIED (ICD-493.10) Assessment: Unchanged reviewed Dr Teddy Spike tx plan w/ pt.  pt feels her PND is more of an issue than GERD.  based on this will have her do the decongestant 1st and then start PPI if no relief.  pt to call and let me know how her sxs are progressing.  will follow. Her updated medication list for this problem includes:    Ventolin Hfa 108 (90 Base) Mcg/act Aers (Albuterol sulfate) .Marland Kitchen... 2 puffs every 4 hours as needed for cough or wheezing    Qvar 80 Mcg/act Aers (Beclomethasone dipropionate) .Marland Kitchen..Marland Kitchen Two  puffs twice daily  Complete Medication List: 1)  Felodipine 10 Mg Xr24h-tab (Felodipine) .... One tablet by mouth daily 2)  Hydrochlorothiazide 50 Mg Tabs (Hydrochlorothiazide) .... One tablet by mouth daily 3)  Ventolin Hfa 108 (90 Base) Mcg/act Aers (Albuterol sulfate) .... 2 puffs every 4 hours as needed for cough or wheezing 4)  Pravastatin Sodium 40 Mg Tabs (Pravastatin sodium) .... Take one tablet at bedtime 5)  Centrum Silver Ultra Womens Tabs (Multiple vitamins-minerals) .... One tablet by mouth daily 6)  Caltrate 600+d Plus 600-400 Mg-unit Tabs (Calcium carbonate-vit d-min) .... One tablet by mouth two times a day 7)  Fish Oil 1000 Mg Caps (Omega-3 fatty acids) .... Take 1 tablet by mouth once a day 8)  Cinnamon 500 Mg Caps (Cinnamon) .... Take one every morning and one every evening 9)  Qvar 80 Mcg/act Aers (Beclomethasone dipropionate) .... Two  puffs twice daily  Patient Instructions: 1)  Please schedule your complete physical for Feb- don't eat before this appt 2)  We'll notify you of your lab results 3)  Take the Chlorpheniramine (nasal drip medicine) for at least 2 weeks- let me know  how your symptoms are at that point 4)  You had your mammogram on 12/21/08- so schedule one for after that date 5)  Call with any questions or concerns 6)  Hang in there!!

## 2010-03-28 NOTE — Assessment & Plan Note (Signed)
Summary: rto 3 months/cbs   Vital Signs:  Patient profile:   77 year old female Weight:      193 pounds BMI:     37.83 Pulse rate:   82 / minute BP sitting:   130 / 84  (left arm)  Vitals Entered By: Doristine Devoid CMA (January 11, 2010 9:40 AM) CC: f/u and labs    History of Present Illness: 77 yo woman here today for  1) DM- has been working on diet.  denies CP, SOB (above baseline), HAs, visual changes, edema, symptomatic lows.  has upcoming eye exam 11/29.  2) SOB- ran out of QVAR end of Oct.  reports most helpful has been the allergy tabs.  reports sxs seem to be seasonal.    3) HTN- BP well controlled.  asymptomatic  4) Hyperlipidemia- no trouble w/ medication switch.  no abd pain, N/V, myalgias.  5) visual changes- went to eye doctor who told her that her flash of light was not related to any problems w/ her eye and told her it was a 'blood vessel problem'.  pt denies HAs following visual change.  has occured once since last visit.  denies focal numbness or weakness.  Current Medications (verified): 1)  Felodipine 10 Mg Xr24h-Tab (Felodipine) .... One Tablet By Mouth Daily 2)  Hydrochlorothiazide 50 Mg Tabs (Hydrochlorothiazide) .... One Tablet By Mouth Daily 3)  Ventolin Hfa 108 (90 Base) Mcg/act Aers (Albuterol Sulfate) .... 2 Puffs Every 4 Hours As Needed For Cough or Wheezing 4)  Simvastatin 40 Mg Tabs (Simvastatin) .Marland Kitchen.. 1 By Mouth At Bedtime 5)  Centrum Silver Ultra Womens  Tabs (Multiple Vitamins-Minerals) .... One Tablet By Mouth Daily 6)  Caltrate 600+d Plus 600-400 Mg-Unit Tabs (Calcium Carbonate-Vit D-Min) .... One Tablet By Mouth Two Times A Day 7)  Fish Oil 1000 Mg Caps (Omega-3 Fatty Acids) .... Take 1 Tablet By Mouth Once A Day 8)  Cinnamon 500 Mg Caps (Cinnamon) .... Take One Every Morning and One Every Evening 9)  Qvar 80 Mcg/act  Aers (Beclomethasone Dipropionate) .... Two  Puffs Twice Daily 10)  Allergy 4 Mg Tabs (Chlorpheniramine Maleate) .... Take 2  Tab Once Daily  Allergies (verified): No Known Drug Allergies  Past History:  Past medical, surgical, family and social histories (including risk factors) reviewed, and no changes noted (except as noted below).  Past Medical History: Hypertension hx of MRSA infxn DM- dx'd 9/11 allergy related asthma  Past Surgical History: Reviewed history from 08/19/2009 and no changes required. Hysterectomy, BSO  2000s Hernia repair Left knee scope Absess removal- Left thigh seroma  Family History: Reviewed history from 08/19/2009 and no changes required. CAD-mother MI, also had allergies HTN-mother,father DM-no COLON CA-no STROKE-no BREAST CA-no father with prostate cancer  Social History: Reviewed history from 08/19/2009 and no changes required. pt is divorced and lives alone pt has children. pt has never smoked.   works at [employer] no tobacco, ETOH.  Review of Systems      See HPI  Physical Exam  General:  overweight white female, NAD Head:  Normocephalic and atraumatic without obvious abnormalities. No apparent alopecia or balding. Eyes:  No corneal or conjunctival inflammation noted. EOMI. Perrla. Neck:  No deformities, masses, or tenderness noted.  ? bruit on L Lungs:  Normal respiratory effort, chest expands symmetrically. Lungs are clear to auscultation, no crackles or wheezes. Heart:  Normal rate and regular rhythm. S1 and S2 normal without gallop, murmur, click, rub or other extra sounds.  Abdomen:  soft, NT/ND, +BS Pulses:  +2 carotid, radial, DP Extremities:  trace edema of LLE no edema on R   Impression & Recommendations:  Problem # 1:  DIABETES-TYPE 2 (ICD-250.00) Assessment Unchanged trying to manage w/ diet.  limited exercise.  check labs.  add meds as needed. Orders: Venipuncture (14782) TLB-A1C / Hgb A1C (Glycohemoglobin) (83036-A1C) TLB-BMP (Basic Metabolic Panel-BMET) (80048-METABOL) Specimen Handling (95621)  Problem # 2:  INTRINSIC ASTHMA,  UNSPECIFIED (ICD-493.10) Assessment: Unchanged reports sxs have dramatically improved since treating allergies.  ran out of QVAR end of Oct but hasn't needed it.  will plan on restarting mid-Feb prior to spring bloom. Her updated medication list for this problem includes:    Ventolin Hfa 108 (90 Base) Mcg/act Aers (Albuterol sulfate) .Marland Kitchen... 2 puffs every 4 hours as needed for cough or wheezing    Qvar 80 Mcg/act Aers (Beclomethasone dipropionate) .Marland Kitchen..Marland Kitchen Two  puffs twice daily  Problem # 3:  CAROTID BRUIT, LEFT (ICD-785.9) Assessment: Unchanged given pt's visual changes and ? bruit stressed the importance of carotid dopplers and possibly head CT.  pt reluctantly agrees to [place] but refuses head CT at this time due to cost.  discussed that if she has recurrence of sxs she needs to notify me immediately so we can proceed w/ additional imaging.  Pt expresses understanding and is in agreement w/ this plan. Orders: Doppler Referral (Doppler)  Problem # 4:  HYPERTENSION (ICD-401.9) Assessment: Unchanged adequate control on current meds.  no changes at this time. Her updated medication list for this problem includes:    Felodipine 10 Mg Xr24h-tab (Felodipine) ..... One tablet by mouth daily    Hydrochlorothiazide 50 Mg Tabs (Hydrochlorothiazide) ..... One tablet by mouth daily  Problem # 5:  HYPERLIPIDEMIA (ICD-272.4) Assessment: Unchanged due for LFTs since meds were changed. Her updated medication list for this problem includes:    Simvastatin 40 Mg Tabs (Simvastatin) .Marland Kitchen... 1 by mouth at bedtime  Orders: TLB-Hepatic/Liver Function Pnl (80076-HEPATIC) Specimen Handling (30865)  Complete Medication List: 1)  Felodipine 10 Mg Xr24h-tab (Felodipine) .... One tablet by mouth daily 2)  Hydrochlorothiazide 50 Mg Tabs (Hydrochlorothiazide) .... One tablet by mouth daily 3)  Ventolin Hfa 108 (90 Base) Mcg/act Aers (Albuterol sulfate) .... 2 puffs every 4 hours as needed for cough or wheezing 4)   Simvastatin 40 Mg Tabs (Simvastatin) .Marland Kitchen.. 1 by mouth at bedtime 5)  Centrum Silver Ultra Womens Tabs (Multiple vitamins-minerals) .... One tablet by mouth daily 6)  Caltrate 600+d Plus 600-400 Mg-unit Tabs (Calcium carbonate-vit d-min) .... One tablet by mouth two times a day 7)  Fish Oil 1000 Mg Caps (Omega-3 fatty acids) .... Take 1 tablet by mouth once a day 8)  Cinnamon 500 Mg Caps (Cinnamon) .... Take one every morning and one every evening 9)  Qvar 80 Mcg/act Aers (Beclomethasone dipropionate) .... Two  puffs twice daily 10)  Allergy 4 Mg Tabs (Chlorpheniramine maleate) .... Take 2 tab once daily  Patient Instructions: 1)  Follow up as scheduled in Feb for your physical- don't eat before this appt 2)  We'll notify you of your Ultrasound appt 3)  If you have another episode of visual changes I need to know about it right away and we will order a CT scan of your head 4)  We'll notify you of your lab results 5)  Call with any questions or concerns 6)  Happy Holidays!   Orders Added: 1)  Venipuncture [36415] 2)  TLB-A1C / Hgb A1C (Glycohemoglobin) [83036-A1C]  3)  TLB-Hepatic/Liver Function Pnl [80076-HEPATIC] 4)  TLB-BMP (Basic Metabolic Panel-BMET) [80048-METABOL] 5)  Specimen Handling [99000] 6)  Doppler Referral [Doppler] 7)  Est. Patient Level IV [09811]

## 2010-03-28 NOTE — Assessment & Plan Note (Signed)
Summary: RECHECK BLOOD PRESSURE///SPH  Nurse Visit   Vital Signs:  Patient profile:   77 year old female Weight:      194 pounds Pulse rate:   64 / minute BP sitting:   122 / 70  (left arm)  Vitals Entered By: Doristine Devoid (August 18, 2009 11:39 AM) CC: bp check    Allergies: No Known Drug Allergies  Orders Added: 1)  Est. Patient Level I [16109]

## 2010-03-28 NOTE — Progress Notes (Signed)
Summary: [facility]   Imported By: Sherian Rein 08/25/2009 09:05:55  _____________________________________________________________________  External Attachment:    Type:   Image     Comment:   External Document

## 2010-03-28 NOTE — Progress Notes (Signed)
Summary: simvastatin refill   Phone Note Refill Request Message from:  Fax from Pharmacy on December 08, 2009 2:06 PM  Refills Requested: Medication #1:  SIMVASTATIN 40 MG TABS 1 by mouth at bedtime [facility] fax [phone]   Initial call taken by: Okey Regal [place],  December 08, 2009 2:07 PM    Prescriptions: SIMVASTATIN 40 MG TABS (SIMVASTATIN) 1 by mouth at bedtime  #30 x 3   Entered by:   Doristine Devoid CMA   Authorized by:   Neena Rhymes MD   Signed by:   Doristine Devoid CMA on 12/08/2009   Method used:   Electronically to        [facility]* (retail)       [address] [place], [place]  [address]       Ph: [phone]       Fax: [phone]   RxID:   [id number]

## 2010-03-28 NOTE — Assessment & Plan Note (Signed)
Summary: DISCUSS NEW ONSET DM/KB   Vital Signs:  Patient profile:   77 year old female Height:      60 inches Weight:      193 pounds Temp:     96.9 degrees F oral Pulse rate:   80 / minute BP sitting:   120 / 66  (left arm)  Vitals Entered By: Jeremy Johann CMA (November 09, 2009 1:38 PM) CC: discuss cholestrol med   History of Present Illness: 77 yo woman here today to discuss  1) DM- pt confused about this dx.  'i don't eat bad things'.  admits to previously having 'a problem w/ sweets' but says this is no longer an issue.  tries to ride stationary bike.  has had recent eye exam- has cataract but otherwise normal.  has a baseline knowledge of DM due to close friend who has had the dz for 'a long time'.  denies CP, SOB, N/V, edema (above baseline).  2) Hyperlipidemia- pt confused about med change.  previously on Pravastatin but was switched to Simvastatin.  has not picked up new meds b/c she didn't understand why.    Current Medications (verified): 1)  Felodipine 10 Mg Xr24h-Tab (Felodipine) .... One Tablet By Mouth Daily 2)  Hydrochlorothiazide 50 Mg Tabs (Hydrochlorothiazide) .... One Tablet By Mouth Daily 3)  Ventolin Hfa 108 (90 Base) Mcg/act Aers (Albuterol Sulfate) .... 2 Puffs Every 4 Hours As Needed For Cough or Wheezing 4)  Simvastatin 40 Mg Tabs (Simvastatin) .Marland Kitchen.. 1 By Mouth At Bedtime 5)  Centrum Silver Ultra Womens  Tabs (Multiple Vitamins-Minerals) .... One Tablet By Mouth Daily 6)  Caltrate 600+d Plus 600-400 Mg-Unit Tabs (Calcium Carbonate-Vit D-Min) .... One Tablet By Mouth Two Times A Day 7)  Fish Oil 1000 Mg Caps (Omega-3 Fatty Acids) .... Take 1 Tablet By Mouth Once A Day 8)  Cinnamon 500 Mg Caps (Cinnamon) .... Take One Every Morning and One Every Evening 9)  Qvar 80 Mcg/act  Aers (Beclomethasone Dipropionate) .... Two  Puffs Twice Daily 10)  Allergy 4 Mg Tabs (Chlorpheniramine Maleate) .... Take 2 Tab Once Daily  Allergies (verified): No Known Drug  Allergies  Past History:  Past Medical History: Hypertension hx of MRSA infxn DM- dx'd 9/11  Review of Systems      See HPI  Physical Exam  General:  overweight white female, NAD Neck:  No deformities, masses, or tenderness noted.  ? bruit on L Lungs:  Normal respiratory effort, chest expands symmetrically. Lungs are clear to auscultation, no crackles or wheezes. Heart:  Normal rate and regular rhythm. S1 and S2 normal without gallop, murmur, click, rub or other extra sounds. Pulses:  +2 carotid, radial, DP Extremities:  trace edema of LLE no edema on R Psych:  Oriented X3, memory intact for recent and remote, normally interactive, good eye contact, not anxious appearing, and not depressed appearing.     Impression & Recommendations:  Problem # 1:  DIABETES-TYPE 2 (ICD-250.00) Assessment New discussed dx, need for yearly eye exams, new LDL goals.  reviewed low carb diet, importance of regular exercise.  will have pt focus on diet and exercise for next 3 months and recheck A1C.  will not have pt check CBGs at this time.  check for microalbumin- start ACE as needed.  total time spent w/ pt 31 minutes, >50% spent reviewing labs and counseling Orders: TLB-Microalbumin/Creat Ratio, Urine (82043-MALB) Nutrition Referral (Nutrition)  Problem # 2:  HYPERLIPIDEMIA (ICD-272.4) Assessment: Unchanged LDL of 121 is not  close enough to new diabetic goal of <70.  switch prava to simvastatin.  pt now understands why. Her updated medication list for this problem includes:    Simvastatin 40 Mg Tabs (Simvastatin) .Marland Kitchen... 1 by mouth at bedtime  Problem # 3:  CAROTID BRUIT, LEFT (ICD-785.9) Assessment: New recommended [place] to pt but she prefers to hold off at this time due to cost.  will follow at future visits.  Complete Medication List: 1)  Felodipine 10 Mg Xr24h-tab (Felodipine) .... One tablet by mouth daily 2)  Hydrochlorothiazide 50 Mg Tabs (Hydrochlorothiazide) .... One tablet by mouth  daily 3)  Ventolin Hfa 108 (90 Base) Mcg/act Aers (Albuterol sulfate) .... 2 puffs every 4 hours as needed for cough or wheezing 4)  Simvastatin 40 Mg Tabs (Simvastatin) .Marland Kitchen.. 1 by mouth at bedtime 5)  Centrum Silver Ultra Womens Tabs (Multiple vitamins-minerals) .... One tablet by mouth daily 6)  Caltrate 600+d Plus 600-400 Mg-unit Tabs (Calcium carbonate-vit d-min) .... One tablet by mouth two times a day 7)  Fish Oil 1000 Mg Caps (Omega-3 fatty acids) .... Take 1 tablet by mouth once a day 8)  Cinnamon 500 Mg Caps (Cinnamon) .... Take one every morning and one every evening 9)  Qvar 80 Mcg/act Aers (Beclomethasone dipropionate) .... Two  puffs twice daily 10)  Allergy 4 Mg Tabs (Chlorpheniramine maleate) .... Take 2 tab once daily  Patient Instructions: 1)  Please schedule a follow-up appointment in 3 months to recheck Diabetes. 2)  Someone will call you with your Nutrition appt 3)  STOP the Pravastatin.  START the Simvastatin for the cholesterol 4)  Try and get regular exercise and focus on healthy diet 5)  Call with any questions or concerns 6)  Hang in there!!

## 2010-04-26 ENCOUNTER — Encounter: Payer: Self-pay | Admitting: Family Medicine

## 2010-04-26 ENCOUNTER — Encounter (INDEPENDENT_AMBULATORY_CARE_PROVIDER_SITE_OTHER): Payer: Medicare PPO | Admitting: Family Medicine

## 2010-04-26 ENCOUNTER — Other Ambulatory Visit: Payer: Self-pay | Admitting: Family Medicine

## 2010-04-26 DIAGNOSIS — E785 Hyperlipidemia, unspecified: Secondary | ICD-10-CM

## 2010-04-26 DIAGNOSIS — E119 Type 2 diabetes mellitus without complications: Secondary | ICD-10-CM

## 2010-04-26 DIAGNOSIS — I4891 Unspecified atrial fibrillation: Secondary | ICD-10-CM

## 2010-04-26 DIAGNOSIS — Z0000 Encounter for general adult medical examination without abnormal findings: Secondary | ICD-10-CM

## 2010-04-26 DIAGNOSIS — I10 Essential (primary) hypertension: Secondary | ICD-10-CM

## 2010-04-26 LAB — BASIC METABOLIC PANEL
BUN: 34 mg/dL — ABNORMAL HIGH (ref 6–23)
Chloride: 104 mEq/L (ref 96–112)
Creatinine, Ser: 1.2 mg/dL (ref 0.4–1.2)
Glucose, Bld: 108 mg/dL — ABNORMAL HIGH (ref 70–99)
Potassium: 3.6 mEq/L (ref 3.5–5.1)

## 2010-04-26 LAB — HEPATIC FUNCTION PANEL
ALT: 21 U/L (ref 0–35)
AST: 24 U/L (ref 0–37)
Alkaline Phosphatase: 70 U/L (ref 39–117)
Bilirubin, Direct: 0.1 mg/dL (ref 0.0–0.3)
Total Bilirubin: 0.8 mg/dL (ref 0.3–1.2)

## 2010-04-26 LAB — CBC WITH DIFFERENTIAL/PLATELET
Basophils Absolute: 0 10*3/uL (ref 0.0–0.1)
Eosinophils Absolute: 0.1 10*3/uL (ref 0.0–0.7)
HCT: 39.9 % (ref 36.0–46.0)
Lymphs Abs: 1.1 10*3/uL (ref 0.7–4.0)
MCV: 88.6 fl (ref 78.0–100.0)
Monocytes Absolute: 0.4 10*3/uL (ref 0.1–1.0)
Neutrophils Relative %: 67.8 % (ref 43.0–77.0)
Platelets: 157 10*3/uL (ref 150.0–400.0)
RDW: 15 % — ABNORMAL HIGH (ref 11.5–14.6)

## 2010-04-26 LAB — TSH: TSH: 2.85 u[IU]/mL (ref 0.35–5.50)

## 2010-05-04 NOTE — Assessment & Plan Note (Signed)
Summary: CPX & LAB/CBS   Vital Signs:  Patient profile:   77 year old female Weight:      186 pounds BMI:     36.46 Pulse rate:   76 / minute BP sitting:   126 / 74  (left arm)  Vitals Entered By: Doristine Devoid CMA (April 26, 2010 10:16 AM) CC: yearly exam and lab   History of Present Illness: 77 yo woman here today for complete physical.  overdue on mammogram- plans on scheduling.  not interested in repeat colonoscopy.  Here for Medicare AWV:  1.   Risk factors based on Past M, S, F history: DM- has lost 7 lbs, denies symptomatic lows, not checking CBGs.  asymptomatic Hyperlipidemia- on Simvastatin, no abd pain, N/V, myalgias HTN- BP well controlled.  no CP, SOB, HAs, visual changes, edema 2.   Physical Activities: ambulates well w/ cane, not exercising 3.   Depression/mood: denies sxs 4.   Hearing: normal to whispered voice at 6 ft 5.   ADL's: independent 6.   Fall Risk: moderate risk, ambulates w/ a cane 7.   Home Safety: safe at home 8.   Height, weight, &visual acuity: see vitals, vision corrected w/ cataract lens implants 9.   Counseling: provided on healthy diet, regular exercise 10.   Labs ordered based on risk factors: see A&P 11.           Referral Coordination: see A&P 12.           Care Plan: see A&P 13.           Cognitive Assessment: normal linear thought process, memory intact, no deficits noted  Preventive Screening-Counseling & Management  Alcohol-Tobacco     Alcohol drinks/day: 0     Smoking Status: never  Caffeine-Diet-Exercise     Does Patient Exercise: no  Current Medications (verified): 1)  Felodipine 10 Mg Xr24h-Tab (Felodipine) .... One Tablet By Mouth Daily 2)  Hydrochlorothiazide 50 Mg Tabs (Hydrochlorothiazide) .... One Tablet By Mouth Daily 3)  Ventolin Hfa 108 (90 Base) Mcg/act Aers (Albuterol Sulfate) .... 2 Puffs Every 4 Hours As Needed For Cough or Wheezing 4)  Simvastatin 40 Mg Tabs (Simvastatin) .Marland Kitchen.. 1 By Mouth At Bedtime 5)   Centrum Silver Ultra Womens  Tabs (Multiple Vitamins-Minerals) .... One Tablet By Mouth Daily 6)  Caltrate 600+d Plus 600-400 Mg-Unit Tabs (Calcium Carbonate-Vit D-Min) .... One Tablet By Mouth Two Times A Day 7)  Fish Oil 1000 Mg Caps (Omega-3 Fatty Acids) .... Take 1 Tablet By Mouth Once A Day 8)  Cinnamon 500 Mg Caps (Cinnamon) .... Take One Every Morning and One Every Evening 9)  Qvar 80 Mcg/act  Aers (Beclomethasone Dipropionate) .... Two  Puffs Twice Daily 10)  Allergy 4 Mg Tabs (Chlorpheniramine Maleate) .... Take 2 Tab Once Daily  Allergies (verified): No Known Drug Allergies  Past History:  Past medical, surgical, family and social histories (including risk factors) reviewed, and no changes noted (except as noted below).  Past Medical History: Hypertension hx of MRSA infxn DM- dx'd 9/11 allergy related asthma ? of Afib  Past Surgical History: Hysterectomy, BSO  2000s Hernia repair Left knee scope Absess removal- Left thigh seroma L cataract removal-1/12 R cataract removal-2/12  Family History: Reviewed history from 08/19/2009 and no changes required. CAD-mother MI, also had allergies HTN-mother,father DM-no COLON CA-no STROKE-no BREAST CA-no father with prostate cancer  Social History: Reviewed history from 08/19/2009 and no changes required. pt is divorced and lives alone pt has children.  pt has never smoked.   works at [employer] [place] no tobacco, ETOH.  Review of Systems  The patient denies anorexia, fever, weight loss, weight gain, vision loss, decreased hearing, hoarseness, chest pain, syncope, dyspnea on exertion, peripheral edema, prolonged cough, headaches, abdominal pain, melena, hematochezia, severe indigestion/heartburn, hematuria, suspicious skin lesions, depression, abnormal bleeding, enlarged lymph nodes, and breast masses.    Physical Exam  General:  overweight white female, NAD Head:  Normocephalic and atraumatic without obvious abnormalities.  No apparent alopecia or balding. Eyes:  No corneal or conjunctival inflammation noted. EOMI. Perrla. Ears:  External ear exam shows no significant lesions or deformities.  Otoscopic examination reveals clear canals, tympanic membranes are intact bilaterally without bulging, retraction, inflammation or discharge. Hearing is grossly normal bilaterally. Nose:  External nasal examination shows no deformity or inflammation. Nasal mucosa are pink and moist without lesions or exudates. Mouth:  Oral mucosa and oropharynx without lesions or exudates.  Teeth in good repair. Neck:  No deformities, masses, or tenderness noted.  ? bruit on L Breasts:  No mass, nodules, thickening, tenderness, bulging, retraction, inflamation, nipple discharge or skin changes noted.   Lungs:  Normal respiratory effort, chest expands symmetrically. Lungs are clear to auscultation, no crackles or wheezes. Heart:  Normal rate and regular rhythm. S1 and S2 normal without gallop, murmur, click, rub or other extra sounds. Abdomen:  soft, NT/ND, +BS Pulses:  +2 carotid, radial, DP Extremities:  trace edema of LLE no edema on R Neurologic:  No cranial nerve deficits noted. Station and gait are normal. Plantar reflexes are down-going bilaterally. DTRs are symmetrical throughout. Sensory, motor and coordinative functions appear intact. Skin:  Intact without suspicious lesions or rashes Cervical Nodes:  No lymphadenopathy noted Axillary Nodes:  No palpable lymphadenopathy Psych:  Oriented X3, memory intact for recent and remote, normally interactive, good eye contact, not anxious appearing, and not depressed appearing.     Impression & Recommendations:  Problem # 1:  PHYSICAL EXAMINATION (ICD-V70.0) Assessment Unchanged  Pt's PE WNL.  check labs.  reviewed health maintainence- pt declines colonoscopy referral.  anticipatory guidance provided.  Orders: Medicare -1st Annual Wellness Visit [phone])  Problem # 2:  DIABETES-TYPE 2  (ICD-250.00) Assessment: Unchanged has lost weight.  watching diet more carefully.  check labs.  start meds if needed Orders: Venipuncture (62130) Specimen Handling (86578) TLB-A1C / Hgb A1C (Glycohemoglobin) (83036-A1C) TLB-BMP (Basic Metabolic Panel-BMET) (80048-METABOL) TLB-CBC Platelet - w/Differential (85025-CBCD) TLB-TSH (Thyroid Stimulating Hormone) (84443-TSH)  Problem # 3:  HYPERLIPIDEMIA (ICD-272.4) Assessment: Unchanged tolerating meds w/out difficulty.  check labs.  adjust meds as needed. Her updated medication list for this problem includes:    Simvastatin 40 Mg Tabs (Simvastatin) .Marland Kitchen... 1 by mouth at bedtime  Orders: Specimen Handling (46962) TLB-Lipid Panel (80061-LIPID) TLB-Hepatic/Liver Function Pnl (80076-HEPATIC)  Problem # 4:  HYPERTENSION (ICD-401.9) Assessment: Unchanged well controlled today.  asymptomatic. Her updated medication list for this problem includes:    Felodipine 10 Mg Xr24h-tab (Felodipine) ..... One tablet by mouth daily    Hydrochlorothiazide 50 Mg Tabs (Hydrochlorothiazide) ..... One tablet by mouth daily  Orders: EKG w/ Interpretation (93000)  Problem # 5:  ATRIAL FIBRILLATION (ICD-427.31) Assessment: New EKG is questioning whether there is Afib present.  pt admits that she will have 'bursts of irregular heart rate'.  pulse is irregular today on exam.  will refer to cards for further eval. Her updated medication list for this problem includes:    Felodipine 10 Mg Xr24h-tab (Felodipine) ..... One tablet by  mouth daily  Orders: Cardiology Referral (Cardiology)  Complete Medication List: 1)  Felodipine 10 Mg Xr24h-tab (Felodipine) .... One tablet by mouth daily 2)  Hydrochlorothiazide 50 Mg Tabs (Hydrochlorothiazide) .... One tablet by mouth daily 3)  Ventolin Hfa 108 (90 Base) Mcg/act Aers (Albuterol sulfate) .... 2 puffs every 4 hours as needed for cough or wheezing 4)  Simvastatin 40 Mg Tabs (Simvastatin) .Marland Kitchen.. 1 by mouth at  bedtime 5)  Centrum Silver Ultra Womens Tabs (Multiple vitamins-minerals) .... One tablet by mouth daily 6)  Caltrate 600+d Plus 600-400 Mg-unit Tabs (Calcium carbonate-vit d-min) .... One tablet by mouth two times a day 7)  Fish Oil 1000 Mg Caps (Omega-3 fatty acids) .... Take 1 tablet by mouth once a day 8)  Cinnamon 500 Mg Caps (Cinnamon) .... Take one every morning and one every evening 9)  Qvar 80 Mcg/act Aers (Beclomethasone dipropionate) .... Two  puffs twice daily 10)  Allergy 4 Mg Tabs (Chlorpheniramine maleate) .... Take 2 tab once daily   Orders Added: 1)  Venipuncture [36415] 2)  EKG w/ Interpretation [93000] 3)  Specimen Handling [99000] 4)  Cardiology Referral [Cardiology] 5)  TLB-A1C / Hgb A1C (Glycohemoglobin) [83036-A1C] 6)  TLB-BMP (Basic Metabolic Panel-BMET) [80048-METABOL] 7)  TLB-CBC Platelet - w/Differential [85025-CBCD] 8)  TLB-TSH (Thyroid Stimulating Hormone) [84443-TSH] 9)  TLB-Lipid Panel [80061-LIPID] 10)  TLB-Hepatic/Liver Function Pnl [80076-HEPATIC] 11)  Medicare -1st Annual Wellness Visit [G0438] 12)  Est. Patient Level IV [16109]    History of Present Illness: 77 yo woman here today for complete physical.  overdue on mammogram- plans on scheduling.  not interested in repeat colonoscopy.  Here for Medicare AWV:  1.   Risk factors based on Past M, S, F history: DM- has lost 7 lbs, denies symptomatic lows, not checking CBGs.  asymptomatic Hyperlipidemia- on Simvastatin, no abd pain, N/V, myalgias HTN- BP well controlled.  no CP, SOB, HAs, visual changes, edema 2.   Physical Activities: ambulates well w/ cane, not exercising 3.   Depression/mood: denies sxs 4.   Hearing: normal to whispered voice at 6 ft 5.   ADL's: independent 6.   Fall Risk: moderate risk, ambulates w/ a cane 7.   Home Safety: safe at home 8.   Height, weight, &visual acuity: see vitals, vision corrected w/ cataract lens implants 9.   Counseling: provided on healthy diet,  regular exercise 10.   Labs ordered based on risk factors: see A&P 11.           Referral Coordination: see A&P 12.           Care Plan: see A&P 13.           Cognitive Assessment: normal linear thought process, memory intact, no deficits noted

## 2010-05-11 ENCOUNTER — Institutional Professional Consult (permissible substitution): Payer: Medicare PPO | Admitting: Internal Medicine

## 2010-05-25 ENCOUNTER — Other Ambulatory Visit: Payer: Self-pay | Admitting: Family

## 2010-06-16 ENCOUNTER — Institutional Professional Consult (permissible substitution): Payer: Medicare PPO | Admitting: Internal Medicine

## 2010-06-19 ENCOUNTER — Other Ambulatory Visit: Payer: Self-pay | Admitting: Family

## 2010-06-20 ENCOUNTER — Encounter: Payer: Self-pay | Admitting: Internal Medicine

## 2010-06-21 ENCOUNTER — Ambulatory Visit (INDEPENDENT_AMBULATORY_CARE_PROVIDER_SITE_OTHER): Payer: Medicare PPO | Admitting: Internal Medicine

## 2010-06-21 ENCOUNTER — Encounter: Payer: Self-pay | Admitting: Internal Medicine

## 2010-06-21 VITALS — BP 140/80 | HR 74 | Resp 14 | Ht 62.0 in | Wt 188.0 lb

## 2010-06-21 DIAGNOSIS — I10 Essential (primary) hypertension: Secondary | ICD-10-CM

## 2010-06-21 DIAGNOSIS — R002 Palpitations: Secondary | ICD-10-CM

## 2010-06-21 DIAGNOSIS — I4891 Unspecified atrial fibrillation: Secondary | ICD-10-CM

## 2010-06-21 NOTE — Assessment & Plan Note (Signed)
Her blood pressure is fairly well controlled. I have asked that she maintain a low-salt diet. She will continue her current medications.

## 2010-06-21 NOTE — Patient Instructions (Signed)
Your physician has recommended that you wear a holter monitor. Holter monitors are medical devices that record the heart's electrical activity. Doctors most often use these monitors to diagnose arrhythmias. Arrhythmias are problems with the speed or rhythm of the heartbeat. The monitor is a small, portable device. You can wear one while you do your normal daily activities. This is usually used to diagnose what is causing palpitations/syncope (passing out). -24 hour (palpitations)

## 2010-06-21 NOTE — Progress Notes (Signed)
HPI [name] is referred today for evaluation of atrial fibrillation. The patient has been followed by Dr.Tabori. She has a history of irregular heartbeat. She does not have palpitations per se. She notes that when she lays down at night, she will feel like her heart is beating irregularly. She denies chest pain or shortness of breath. She does have a history of diabetes, hypertension, and obesity. She has had no syncope. No Known Allergies   Current Outpatient Prescriptions  Medication Sig Dispense Refill  . beclomethasone (QVAR) 80 MCG/ACT inhaler Inhale 1 puff into the lungs 2 (two) times daily.        . chlorpheniramine (ALLERGY) 4 MG tablet Take 4 mg by mouth as directed.        Marland Kitchen CINNAMON PO 1 tab po qd      . felodipine (PLENDIL) 10 MG 24 hr tablet TAKE ONE TABLET BY MOUTH EVERY DAY  30 tablet  1  . fish oil-omega-3 fatty acids 1000 MG capsule 2 tabs po qd      . hydrochlorothiazide 50 MG tablet TAKE ONE TABLET BY MOUTH EVERY DAY  30 tablet  1  . Multiple Vitamin (MULTIVITAMIN) tablet Take 1 tablet by mouth daily.        . simvastatin (ZOCOR) 40 MG tablet Take 40 mg by mouth at bedtime.           Past Medical History  Diagnosis Date  . DIABETES-TYPE 2   . Other and unspecified hyperlipidemia   . OBESITY   . HYPERTENSION   . Intrinsic asthma, unspecified   . GERD   . KNEE PAIN   . Edema   . DYSPNEA ON EXERTION   . Atrial fibrillation     ROS:   All systems reviewed and negative except as noted in the HPI.   No past surgical history on file.   Family History  Problem Relation Age of Onset  . Coronary artery disease Mother   . Hypertension Mother   . Hypertension Father   . Prostate cancer Father      History   Social History  . Marital Status: Divorced    Spouse Name: N/A    Number of Children: N/A  . Years of Education: N/A   Occupational History  . Not on file.   Social History Main Topics  . Smoking status: Never Smoker   . Smokeless tobacco:  Not on file  . Alcohol Use: Not on file  . Drug Use: Not on file  . Sexually Active: Not on file   Other Topics Concern  . Not on file   Social History Narrative  . No narrative on file     BP 140/80  Pulse 74  Resp 14  Ht 5\' 2"  (1.575 m)  Wt 188 lb (85.276 kg)  BMI 34.39 kg/m2  Physical Exam:  Obese,well appearing NAD HEENT: Unremarkable Neck:  No JVD, no thyromegally Lymphatics:  No adenopathy Back:  No CVA tenderness Lungs:  Clear HEART:  IRegular rate rhythm, no murmurs, no rubs, no clicks Abd:  Flat, positive bowel sounds, no organomegally, no rebound, no guarding Ext:  2 plus pulses, no edema, no cyanosis, no clubbing Skin:  No rashes no nodules Neuro:  CN II through XII intact, motor grossly intact  EKG NSR with frequent PAC's.  Assess/Plan:

## 2010-06-21 NOTE — Assessment & Plan Note (Signed)
I have reviewed her EKG. She does not have any evidence of atrial fibrillation on her most recent electrocardiogram. She does have irregular heartbeats which are most likely due to PACs. I have recommended that she undergo the wearing of a 24-hour Holter monitor. I will see her back in the office in several weeks.

## 2010-06-27 ENCOUNTER — Encounter (INDEPENDENT_AMBULATORY_CARE_PROVIDER_SITE_OTHER): Payer: Medicare PPO

## 2010-06-27 DIAGNOSIS — R002 Palpitations: Secondary | ICD-10-CM

## 2010-07-11 ENCOUNTER — Other Ambulatory Visit: Payer: Self-pay | Admitting: Family Medicine

## 2010-07-14 NOTE — Op Note (Signed)
NAME:  [name], [name]                           ACCOUNT NO.:  [id number]   MEDICAL RECORD NO.:  [id number]                   PATIENT TYPE:  AMB   LOCATION:  DAY                                  FACILITY:  [facility]   PHYSICIAN:  Angelia Mould. Derrell Lolling, M.D.             DATE OF BIRTH:  10-14-1929   DATE OF PROCEDURE:  03/17/2003  DATE OF DISCHARGE:                                 OPERATIVE REPORT   PREOPERATIVE DIAGNOSIS:  Incarcerated ventral incisional hernia.   POSTOPERATIVE DIAGNOSIS:  Incarcerated ventral incisional hernia.   OPERATION PERFORMED:  Laparoscopic repair of ventral incisional hernia with  Parietex composite mesh (20 cm x 20 cm dimension).   SURGEON:  Angelia Mould. Derrell Lolling, M.D.   OPERATIVE INDICATION:  This is a 77 year old white female who underwent a  hysterectomy and oophorectomy in May of 2002 through a midline incision for  high-grade dysplasia of the endometrium.  She developed a lump in the  midline just to the left of the midline incision below the umbilicus, and  this bothers her.  It is somewhat tender but has not lead to any nausea,  vomiting, heme or obstruction.  Her exam reveals that she is obese.  There  is a hernia just to the left of the midline just inferior to the umbilicus,  and otherwise, her lower midline incision appears well-healed.  A CT scan  showed a ventral hernia in that location with incarcerated fat but no  evidence of GI tract involvement.  She is brought to the operating room  electively.   OPERATIVE TECHNIQUE:  Following the induction of general endotracheal  anesthesia, a Foley catheter was inserted, and the abdomen was prepped and  draped in a sterile fashion.  A short transverse incision was made in the  left subcostal region.  The video camera was inserted into an OptiVu port,  and we passed the ports through the abdominal wall layers under direct  vision and into the abdominal cavity.  Insufflation to 15 mmHg was carried  out.  The video  camera was inserted into the peritoneal cavity, and we  looked thoroughly around the abdominal cavity and saw no bleeding or injury.  We placed two 5 mm trocars on the left side below the camera port.   We found that the patient had a great deal of incarcerated omentum and fat  within multiple medium-sized hernia defects below and to the left of the  umbilicus.  The small bowel was not incarcerated in any of the hernia  defects, but there were a lot of small bowel adhesions to the right of the  midline which had to be taken down sharply and tediously.  We took all of  the fatty adhesions down and reduced all of the incarcerated omentum and fat  out of the hernia defects.  I visualized three defects.  Small bowel  adhesions were taken down very, very carefully,  and the small bowel was  returned to its anatomic position.  We inspected the small bowel on two  occasions.  There was no injury to the small bowel.  We inspected the lower  part of the midline incision, and it appeared to be quite solid and secure.   I marked out the dimensions of the hernia repair using a spinal needle and  then chose to make it a fair amount larger than the defects and ultimately  decided to repair it with a 20 cm x 20 cm piece of mesh.  We brought the  Parietex composite mesh to the operative field and cut it down to the  appropriate size.  We then marked a template on the abdominal wall and then  onto the mesh with a marking pen, marking eight equidistant points on the  circumference of the mesh.  We then placed eight 0 Novafil sutures in the  mesh at these points.  We then moistened the mesh in saline, inserted it  into the abdominal cavity, and then positioned it.  We were very careful to  make sure that the rough side of the mesh was toward the peritoneal surface  and that the smooth hydrophilic side of the mesh was toward the GI tract.   At each of the eight equidistant points, we made a small puncture  wound and  then drew the sutures up through the puncture wounds.  After we had passed  all eight sutures through the abdominal wall, this was inspected.  We had at  least a 1 cm bite of fascia in all areas, and there was no significant  bleeding.  We drew all of the sutures up, and the mesh seemed to be fixed to  the abdominal wall quite nicely with no redundancy at all.  All of the eight  sutures were tied and then cut, and the knots were buried under the skin.  The edges of the mesh were secured to the abdominal wall with a 5 mm tacking  device.  We probably used 40-45 tacks.  A few tacks were placed centrally to  fix the mesh to the abdominal wall.  We were very careful to place all of  the tacks around the circumference, no more than 1 cm apart to avoid any  gaps.  After this was all done, we inspected the repair circumferentially  and found that there was no defect.  We removed the trocars under direct  vision, and there was no bleeding from the trocar sites.  The  pneumoperitoneum was released.  The skin incisions were closed with  subcuticular sutures of 4-0 Vicryl and Steri-Strips.  Clean bandages were  placed, and the patient was taken to the recovery room in a stable  condition.   ESTIMATED BLOOD LOSS:  The estimated blood loss was about 30 mL.   COMPLICATIONS:  None.   SPONGE AND INSTRUMENT COUNTS:  Correct.                                               Angelia Mould. Derrell Lolling, M.D.    HMI/MEDQ  D:  03/17/2003  T:  03/17/2003  Job:  [id number]   cc:   Reuel Boom L. Eda Paschal, M.D.  [address]  [place]  [place] [address]  Fax: [phone]   Vale Haven. Andrey Campanile, M.D.  [phone]  [address]  [place]  [place] [address]  Fax: [phone]

## 2010-07-14 NOTE — Discharge Summary (Signed)
[facility]  Patient:    [name], [name]                    MRN: [id number] Adm. Date:  [id number] Disc. Date: [id number] Attending:  Sharon Mt CC:         Vale Haven. Andrey Campanile, M.D.   Discharge Summary  HISTORY OF PRESENT ILLNESS/HOSPITAL COURSE:  Patient is a 77 year old female who presented to the office with postmenopausal bleeding.  D&C and hysteroscopy revealed either a well-differentiated adenocarcinoma of the endometrium or at least severely atypical adenomatous hyperplasia of the endometrium.  As a result of this, she was taken to the operating room and a total abdominal hysterectomy and salpingo-oophorectomy was performed.  Final pathology report revealed atypical complex hyperplasia of the endometrium. The case fell just barely short of adenocarcinoma, adenomyosis of the uterus, bilateral ovarium stromal hyperthecosis, bilateral fallopian tubes with no pathological abnormalities.  Postoperatively, the patient did well and progressed slowly.  She had a temperature elevation on the first postoperative night of 100.8.  She had a  temperature elevation on the second postoperative night of 100.3.  All her other temperatures throughout the day were normal. She had no symptoms suggestive of an infection.  She was recovering from bronchitis and had finished a course of azithromycin prior to being hospitalized.  She was discharged on Tylox for pain relief, regular diet, ambulatory activity.  She was instructed to call for temperature elevations and/or new symptoms.  She is to return to my office on Thursday for staple removal.  CONDITION ON DISCHARGE:  Improved.  FINAL DISPOSITION:  Atypical complex hyperplasia of the endometrium, adenomyosis, ovarian stromal hyperthecosis.  OPERATION:  Total abdominal hysterectomy and bilateral salpingo-oophorectomy. DD:  07/12/00 TD:  07/13/00 Job: [id number]

## 2010-07-14 NOTE — Op Note (Signed)
[facility]  Patient:    [name], [name]                    MRN: [id number] Proc. Date: 07/09/00 Adm. Date:  [id number] Attending:  Sharon Mt                           Operative Report  PREOPERATIVE DIAGNOSES:  Stage 1 adenocarcinoma of the endometrium versus atypical complex adenomatous-type endometrial hyperplasia.  POSTOPERATIVE DIAGNOSIS:  No evidence of gross malignancy.  OPERATION: 1. Total abdominal hysterectomy. 2. Bilateral salpingo-oophorectomy.  SURGEON:  Daniel L. Eda Paschal, M.D.  FIRST ASSISTANT:  Douglass Rivers, M.D.  FINDINGS:  At the time of surgery, the patients uterus was type normal size and shape.  Ovaries and fallopian tubes were normal.  Pelvic peritoneum was free of any disease.  Careful inspection of the entire peritoneal cavity failed to reveal any abnormalities.  The patient had a D&C specimen showing, at the least, atypical complex hyperplasia of the endometrium versus probable stage 1 endometrial adenocarcinoma.  Frozen section today failed to reveal any gross carcinoma and the area that was frozen (which was done randomly since there was no gross lesion) failed to show any adenocarcinoma.  DESCRIPTION OF PROCEDURE:  After adequate general endotracheal anesthesia, the patient was placed in the supine position; prepped and draped in the usual sterile manner.  A Foley catheter was inserted in the patients bladder.  A midline vertical incision was made; it was taken down through the fascia and the peritoneum vertically.  Subcutaneous bleeders were clamped and bovied as encountered.  When the peritoneal cavity was opened the above findings were noted.  Copious peritoneal washings were obtained.  The round ligaments were bovied and cut.  The retroperitoneal space was entered.  The ureters were identified.  The infundibulopelvic ligaments were clamped, cut and doubly suture ligated with #1 chromic catgut.  The  uterine arteries were clamped, cut and doubly suture ligated with #1 chromic catgut.  The bladder flap was advanced sharply.  Surgery was very difficult because of the patients obesity, a very deep pelvis and exceedingly long cervix, and difficulty exposing the space between the cervix and the bladder.  However, care was taken to move slowly to continue to dissect the bladder free, and at no time was it felt that the bladder was entered.  The parametrium continued to be taken down by clamping tight and suture ligating with #1 chromic catgut; it took at least six bites at each side to completely free up the cervix.  At this point, the cervicovaginal junction was identified and entered with sharp dissection.  The uterus, ovaries and tubes were delivered and sent to pathology for tissue diagnosis.  Angled sutures were placed in the angles of the vagina with #1 chromic catgut, incorporating uterosacral ligaments and cardinal ligaments for good wall support.  The cuff was closed with a running locking 0 Vicryl.  The bladder was distended with sterile saline with indigo chromium, to be sure there was no bladder injury -- there was none.  At this point frozen section failed to reveal any carcinoma, either grossly or in the random section, and therefore the procedure was terminated.  Copious irrigation was done with Ringers lactate.  Two sponge, needle and instrument counts were correct.  The peritoneum and fascia were closed with two running 0 PDS, which was double-armed.  The subcutaneous tissue was brought together with the 2-0  plain suture that was running, and the skin was closed with staples.  ESTIMATED BLOOD LOSS:  (for the entire procedure)  400 cc with none replaced.  DISPOSITION:  The patient tolerated the procedure well and left the operating room in satisfactory condition, draining clear urine from her Foley catheter. D:  07/09/00 TD:  07/09/00 Job: [id number]

## 2010-07-14 NOTE — H&P (Signed)
[facility]  Patient:    [name], [name]                          MRN: [id number] Attending:  Rande Brunt. Eda Paschal, M.D.                         History and Physical  CHIEF COMPLAINT:  Adenocarcinoma of the endometrium.  HISTORY OF PRESENT ILLNESS:  The patient is a 77 year old gravida 3, para 3, AB 0 who came to see me because of postmenopausal bleeding not on hormone replacement therapy.  She was taken to the operating room on April 15 and a D&C hysteroscopy revealed a lesion in the lower uterine segment.  The final pathology report on this lesion was atypical complex hyperplasia versus well-differentiated adenocarcinoma not excluded.  It was most suspicious for a grade 1 endometrial adenocarcinoma.  As a result of the above, she now enters the hospital for definitive therapy.  She will undergo a total abdominal hysterectomy, bilateral salpingo-oophorectomy, and surgical staging will be done.  If it is appropriate, lymph node sampling will be done with Dr. Malva Cogan help.  PAST MEDICAL HISTORY:  The patient is hypertensive, on Plendil.  She also has GERD and is on Prevacid.  ALLERGIES:  She is allergic to no drugs.  FAMILY HISTORY:  Father has had prostate cancer.  Mother and father are hypertensive.  SOCIAL HISTORY:  She is a nonsmoker, nondrinker.  REVIEW OF SYSTEMS:  HEENT:  Negative.  CARDIAC:  A history of hypertension. RESPIRATORY:  A history of wheezing.  She is currently under treatment for bronchitis with Zithromax.  GI:  A history of reflux.  GU:  Negative. NEUROLOGIC:  Negative.  PSYCHIATRIC:  Negative.  ALLERGIC/IMMUNOLOGICAL: Negative.  LYMPHATIC:  Negative.  ENDOCRINE:  Negative.  PHYSICAL EXAMINATION:  GENERAL:  The patient is a well-developed and well-nourished female in no acute distress.  VITAL SIGNS:  Blood pressure 120/78, pulse 80 and regular, respirations 16 and unlabored.  She is afebrile.  HEENT:  All within  normal limits.  NECK:  Supple.  Trachea in the midline.  Thyroid is not enlarged.  LUNGS:  Clear to P&A.  HEART:  No thrills, heaves, or murmurs.  BREASTS:  No masses.  ABDOMEN:  Soft without guarding, rebound, or masses.  PELVIC:  External and vaginal is within normal limits.  Cervix is clean.  Pap smear did not show any atypia.  Uterus is top normal size and shape.  Adnexa reveal no masses.  RECTAL:  Negative.  EXTREMITIES:  Within normal limits.  IMPRESSION:  Well-differentiated adenocarcinoma of the endometrium.  PLAN:  Total abdominal hysterectomy and bilateral salpingo-oophorectomy with further surgical staging.DD:  07/08/00 TD:  07/08/00 Job: [id number]

## 2010-07-14 NOTE — Op Note (Signed)
[name], [name]                       ACCOUNT NO.:  [id number]   MEDICAL RECORD NO.:  [id number]                   PATIENT TYPE:  AMB   LOCATION:  DSC                                  FACILITY:  [facility]   PHYSICIAN:  Claude Manges. Cleophas Dunker, M.D.            DATE OF BIRTH:  10-06-1929   DATE OF PROCEDURE:  02/06/2002  DATE OF DISCHARGE:                                 OPERATIVE REPORT   PREOPERATIVE DIAGNOSES:  Tear, medial meniscus, left knee.  Tricompartmental  osteoarthritis (chondromalacia), left knee.   POSTOPERATIVE DIAGNOSES:  Tear, medial meniscus, left knee.  Tricompartmental osteoarthritis (chondromalacia), left knee.   PROCEDURE:  1. Diagnostic arthroscopy, left knee.  2. Partial medial meniscectomy.  3. Shaving of medial compartment, lateral compartment, and patellofemoral     joint.   SURGEON:  Claude Manges. Cleophas Dunker, M.D.   ANESTHESIA:  General.   COMPLICATIONS:  None.   HISTORY:  This 77 year old female fell at the [employer]  on November 03, 2001, catching her foot in a cord hanging from a leather  jacket.  She lost her balance and fell on her left knee.  Since that time,  she has been having persistent pain to the point of compromise.  She relates  she was not having any trouble with her knee prior to the fall.  She has had  an MRI scan revealing a severe osteoarthritis of the medial compartment with  a tear of the medial meniscus.  She wishes to proceed with arthroscopic  evaluation, simply because she was not having any trouble with her knee  before the injury.   PROCEDURE:  The patient was comfortable on the operating table and under  general anesthetic.  The left lower extremity was placed in a thigh holder.  The leg was then prepped with DuraPrep and a thigh holder to the ankle.  The  patient also had a supplemental knee block prior to surgery.   A diagnostic arthroscopy was performed using a medial and lateral  parapatellar tendon  puncture sites.  There was a minimal yellow effusion.   Diagnostic arthroscopy revealed moderate synovitis in the superior pouch.  There was diffuse chondromalacia of the patellofemoral joint, including the  trochlea.  These areas were shaved with the intra-articular shaver such that  there was not any loose articular cartilage. There were small fragments of  articular cartilage floating within the joint at the time of arthroscopy.   Except for synovitis, the gutters were clear.   There was an obvious tear of the posterior portion of the medial meniscus.  There was a large flap tear that did not appear to be degenerative in  nature.  This was carefully debrided with the basket forceps back to a  stable meniscal rim.  The remaining rim was tapered anteriorly and then  checked with a probe to be sure that they were stable. There was also  considerable  chondromalacia diffusely of the tibial plateau and femoral  condyle, and these areas were shaved.   The intercondylar notch was narrow, but the ACL was intact.   The lateral meniscus was intact, but there was a moderate amount of  chondromalacia of the femoral condyle and to a lesser extent the tibial  plateau.  I did not see expulsive chondral bone, but there was deep  furrowing, and these areas were shaved as they were loose.  The joint was  then explored with evidence of loose material.  Two stab wounds were left  open and infiltrated with 0.25% Marcaine with epinephrine.  A sterile bulky  dressing was applied, followed by an Ace bandage.   PLAN:  Percocet for pain.  Office in one week.                                               Claude Manges. Cleophas Dunker, M.D.    PWW/MEDQ  D:  02/06/2002  T:  02/07/2002  Job:  [id number]

## 2010-07-14 NOTE — Op Note (Signed)
[facility]  Patient:    [name], [name]                          MRN: [id number] Proc. Date: 06/10/00 Attending:  Rande Brunt. Eda Paschal, M.D. CC:         Vale Haven. Andrey Campanile, M.D.   Operative Report  PREOPERATIVE DIAGNOSIS:       Postmenopausal bleeding.  POSTOPERATIVE DIAGNOSES:      1. Postmenopausal bleeding.                               2. Endometrial lesion.  OPERATION:                    Hysteroscopy, dilation and curettage.  SURGEON:                      Daniel L. Eda Paschal, M.D.  ANESTHESIA:                   General.  INDICATIONS:                  The patient is a 77 year old gravida 3, para 3, AB0 who was seen in the office with postmenopausal bleeding.  An attempt in the office was made to do endometrial sampling.  It was felt that the endometrium was entered, although she had significant cervical stenosis, making the above procedure difficult.  Endometrial sampling did not reveal any endometrium.  As a result of this, she underwent an ultrasound, which showed an enlarged endometrial stripe at 8 mm with normal pelvic organs otherwise. Because of the inability to get sampling in spite of a large endometrial cavity on ultrasound she now enters the hospital for hysteroscopy, D&C and resection of anything that can be done.  FINDINGS:                     External and vaginal within normal limits. The cervix was clean.  The uterus was top normal size and shape.  Adnexa were not palpable.  The patient has no uterine descensus.  At the time of hysteroscopy, the patient had a lesion on the posterior portion of either the lower uterine segment of the uterus, possibly extending into the beginning of the endocervical canal, but more in the lower uterine segment that was raised, white, and certainly could have been consistent with an early malignancy.  The entire area involved was less than 1 cm.  Other than this, the top of the fundus, anterior and  posterior walls of the fundus, tubal ostia, lower uterine segment and endocervical canal were free of any other disease.  DESCRIPTION OF PROCEDURE:     After adequate general endotracheal anesthesia, the patient was placed in the dorsal lithotomy position, prepped and draped in the usual sterile manner.  A single-tooth tenaculum was placed on the anterior lip of the cervix.  Initially, dilatation of the cervix went smoothly.  She could be dilated up to a #29 Pratt dilator, but it was very difficult to dilate her further and, at this point, it felt like the cervix was going to begin to tear, and so dilatation was stopped.  As a result of this, just a diagnostic hysteroscope was used.  It was attached to a camera.  Sorbitol, 3%, was used to expand the intrauterine cavity.  The camera was used for  magnification.  The lesion was felt to be confined to the lower uterine segment, although it might have extended slightly.  The endocervical canal was visualized.  The diagnostic hysteroscope was removed.  Curettings were done with three different sized curets as well as renal polyp forceps and, based on the appearance of this whitish lesion, it was felt that a portion of this was removed for sampling.  The operating room did not have any biopsy instruments that would fit the diagnostic hysteroscope, so there was no way to do a biopsy under direct visualization with the diagnostic scope.  An attempt was made again to try to dilate her further.  Once again, this was futile.  At this point, the hysteroscopic resectoscope was reinserted.  It was placed into the endometrial cavity under direct visualization so as not to perforate.  It could be done, but it was an extremely tight fit, and sampling really was not possible because of the above.  It was felt at this point that the procedure should be terminated to avoid any significant complication from the surgery. Fluid deficit was less than 200 cc.  Blood  loss was less than 50 cc.  The patient left the operating room in satisfactory condition. DD:  06/10/00 TD:  06/10/00 Job: [id number]

## 2010-08-09 ENCOUNTER — Telehealth: Payer: Self-pay | Admitting: Internal Medicine

## 2010-08-09 NOTE — Telephone Encounter (Signed)
Test results from holter monitor.

## 2010-08-09 NOTE — Telephone Encounter (Signed)
Patient aware of results and will talk with Dr Ladona Ridgel about follow up

## 2010-08-15 NOTE — Telephone Encounter (Signed)
Will schedule for follow up in 08/2010 with Dr Ladona Ridgel

## 2010-09-06 ENCOUNTER — Other Ambulatory Visit: Payer: Self-pay | Admitting: Family

## 2010-09-06 NOTE — Telephone Encounter (Signed)
This is a patient of Dr. Wille Glaser.  I will forward.

## 2010-09-07 ENCOUNTER — Ambulatory Visit (INDEPENDENT_AMBULATORY_CARE_PROVIDER_SITE_OTHER): Payer: Medicare PPO | Admitting: Internal Medicine

## 2010-09-07 ENCOUNTER — Encounter: Payer: Self-pay | Admitting: Internal Medicine

## 2010-09-07 DIAGNOSIS — E785 Hyperlipidemia, unspecified: Secondary | ICD-10-CM

## 2010-09-07 DIAGNOSIS — I498 Other specified cardiac arrhythmias: Secondary | ICD-10-CM

## 2010-09-07 DIAGNOSIS — I10 Essential (primary) hypertension: Secondary | ICD-10-CM

## 2010-09-07 DIAGNOSIS — R001 Bradycardia, unspecified: Secondary | ICD-10-CM

## 2010-09-07 DIAGNOSIS — I4891 Unspecified atrial fibrillation: Secondary | ICD-10-CM

## 2010-09-07 MED ORDER — WARFARIN SODIUM 3 MG PO TABS: 3.0000 mg | ORAL_TABLET | Freq: Every day | ORAL | Status: DC

## 2010-09-07 NOTE — Telephone Encounter (Signed)
Ok to refill each for 6 months but please remind her she needs a diabetes check

## 2010-09-07 NOTE — Patient Instructions (Signed)
Your physician wants you to follow-up in: 6 months with Dr Court Joy will receive a reminder letter in the mail two months in advance. If you don't receive a letter, please call our office to schedule the follow-up appointment.   Your physician has recommended you make the following change in your medication: start Warfarin 3mg  daily  Have your INR checked with Dr Beverely Low on [address]

## 2010-09-07 NOTE — Telephone Encounter (Signed)
Refills sent and mailed letter.

## 2010-09-08 ENCOUNTER — Ambulatory Visit (INDEPENDENT_AMBULATORY_CARE_PROVIDER_SITE_OTHER): Payer: Medicare PPO | Admitting: Family Medicine

## 2010-09-08 ENCOUNTER — Encounter: Payer: Self-pay | Admitting: Family Medicine

## 2010-09-08 DIAGNOSIS — I4891 Unspecified atrial fibrillation: Secondary | ICD-10-CM

## 2010-09-08 NOTE — Progress Notes (Signed)
  Subjective:    Patient ID: [name], female    DOB: 10-14-1929, 77 y.o.   MRN: [id number]  HPI A fib- saw cards yesterday after wearing holter monitor for 24 hrs in May.  Has paroxysmal Afib.  Was recommended to start Warfarin but pt is apprehensive about this.  Wants to know what i recommend.    Review of Systems For ROS see HPI     Objective:   Physical Exam  Vitals reviewed. Constitutional: She appears well-developed and well-nourished.       anxious  HENT:  Head: Normocephalic and atraumatic.  Cardiovascular: Normal rate, regular rhythm and normal heart sounds.        Reg S1/S2 today  Psychiatric: Her behavior is normal. Judgment and thought content normal.       anxious          Assessment & Plan:

## 2010-09-08 NOTE — Assessment & Plan Note (Signed)
Reviewed pt's concerns about taking meds, 'it's poison', bleeding.  Told her that i feel she should start the meds to reduce her risk of stroke and if we have problems w/ the medicine we can always talk about changing it or stopping it.  Pt in agreement and will start med tonight and schedule an appt next week for PT check.

## 2010-09-08 NOTE — Patient Instructions (Signed)
Set up a nurse visit for Wednesday to check your PT/INR Start the Coumadin tonight like Dr Ladona Ridgel instructed If you have questions or concerns- please call Sherri Rad in there!  We'll get this right!!!

## 2010-09-09 ENCOUNTER — Encounter: Payer: Self-pay | Admitting: Internal Medicine

## 2010-09-09 DIAGNOSIS — R001 Bradycardia, unspecified: Secondary | ICD-10-CM | POA: Insufficient documentation

## 2010-09-09 NOTE — Assessment & Plan Note (Signed)
Her blood pressure is elevated but only mildy. She will continue her current meds and maintain a low sodium diet.

## 2010-09-09 NOTE — Assessment & Plan Note (Signed)
She is as best I can tell asymptomatic. She will likely eventually require PPM but does not currently have an indication and I would recommend a period of watchful waiting.

## 2010-09-09 NOTE — Assessment & Plan Note (Signed)
Her stroke risk is high. I have recommended coumadin. She wishes to followup her INR with her primary MD.

## 2010-09-09 NOTE — Progress Notes (Signed)
HPI Ms. Mulkern returns today for followup. She is a pleasant 77 yo woman with a h/o PAF, asymptomatic bradycardia, and HTN. When I saw her 2 months ago for an irregular heart beat, she was in NSR with PAC's. She has subsequently worn a cardiac monitor which demonstrated PAF and nocturnal bradycardia both while in NSR as well as during atrial fib. She denies syncope, c/p or sob. No dizzy spells to speak of. No Known Allergies   Current Outpatient Prescriptions  Medication Sig Dispense Refill  . beclomethasone (QVAR) 80 MCG/ACT inhaler Inhale 1 puff into the lungs as needed.       . chlorpheniramine (ALLERGY) 4 MG tablet Take 4 mg by mouth as directed.        Marland Kitchen CINNAMON PO 1 tab po qd      . felodipine (PLENDIL) 10 MG 24 hr tablet TAKE ONE TABLET BY MOUTH EVERY DAY  30 tablet  6  . fish oil-omega-3 fatty acids 1000 MG capsule 2 tabs po qd      . hydrochlorothiazide 50 MG tablet TAKE ONE TABLET BY MOUTH EVERY DAY  30 tablet  6  . Multiple Vitamin (MULTIVITAMIN) tablet Take 1 tablet by mouth daily.        . simvastatin (ZOCOR) 40 MG tablet        . warfarin (COUMADIN) 3 MG tablet Take 1 tablet (3 mg total) by mouth daily.  30 tablet  3     Past Medical History  Diagnosis Date  . DIABETES-TYPE 2   . Other and unspecified hyperlipidemia   . OBESITY   . HYPERTENSION   . Intrinsic asthma, unspecified   . GERD   . KNEE PAIN   . Edema   . DYSPNEA ON EXERTION   . Atrial fibrillation     ROS:   All systems reviewed and negative except as noted in the HPI.   No past surgical history on file.   Family History  Problem Relation Age of Onset  . Coronary artery disease Mother   . Hypertension Mother   . Hypertension Father   . Prostate cancer Father      History   Social History  . Marital Status: Divorced    Spouse Name: N/A    Number of Children: N/A  . Years of Education: N/A   Occupational History  . Not on file.   Social History Main Topics  . Smoking status: Never  Smoker   . Smokeless tobacco: Not on file  . Alcohol Use: No  . Drug Use: No  . Sexually Active: Not on file   Other Topics Concern  . Not on file   Social History Narrative  . No narrative on file     BP 143/65  Pulse 63  Resp 18  Ht 5' (1.524 m)  Wt 185 lb (83.915 kg)  BMI 36.13 kg/m2  Physical Exam:  Well appearing 77 yo woman, NAD HEENT: Unremarkable Neck:  No JVD, no thyromegally Lymphatics:  No adenopathy Back:  No CVA tenderness Lungs:  Clear with no wheezes or rales. HEART:  Regular rate rhythm, no murmurs, no rubs, no clicks Abd:  soft, positive bowel sounds, no organomegally, no rebound, no guarding Ext:  2 plus pulses, no edema, no cyanosis, no clubbing Skin:  No rashes no nodules Neuro:  CN II through XII intact, motor grossly intact  EKG Holter reviewed and demonstrates both atrial fib and Sinus brady.  Assess/Plan:

## 2010-09-13 ENCOUNTER — Ambulatory Visit (INDEPENDENT_AMBULATORY_CARE_PROVIDER_SITE_OTHER): Payer: Medicare PPO | Admitting: *Deleted

## 2010-09-13 DIAGNOSIS — Z7901 Long term (current) use of anticoagulants: Secondary | ICD-10-CM

## 2010-09-13 DIAGNOSIS — I4891 Unspecified atrial fibrillation: Secondary | ICD-10-CM

## 2010-09-13 NOTE — Patient Instructions (Addendum)
Return to office on Monday September 18, 2010   New dosing instruction: Take 2 tabs today then resume 1 tab daily

## 2010-09-18 ENCOUNTER — Ambulatory Visit (INDEPENDENT_AMBULATORY_CARE_PROVIDER_SITE_OTHER): Payer: Medicare PPO | Admitting: *Deleted

## 2010-09-18 DIAGNOSIS — I4891 Unspecified atrial fibrillation: Secondary | ICD-10-CM

## 2010-09-18 DIAGNOSIS — Z7901 Long term (current) use of anticoagulants: Secondary | ICD-10-CM

## 2010-09-18 LAB — POCT INR: INR: 1.2

## 2010-09-18 NOTE — Patient Instructions (Signed)
Return to office in 1 week for PT/INR check  New dosing instruction:  Take 2 tabs daily (6mg )

## 2010-09-25 ENCOUNTER — Ambulatory Visit (INDEPENDENT_AMBULATORY_CARE_PROVIDER_SITE_OTHER): Payer: Medicare PPO | Admitting: *Deleted

## 2010-09-25 DIAGNOSIS — I4891 Unspecified atrial fibrillation: Secondary | ICD-10-CM

## 2010-09-25 DIAGNOSIS — Z7901 Long term (current) use of anticoagulants: Secondary | ICD-10-CM

## 2010-09-25 LAB — POCT INR: INR: 1.5

## 2010-09-25 MED ORDER — WARFARIN SODIUM 3 MG PO TABS: 3.0000 mg | ORAL_TABLET | ORAL | Status: DC

## 2010-09-25 NOTE — Patient Instructions (Signed)
Return to office in 2 weeks for PT/INR  New dosing: 2 tabs daily except 3 tabs on M,W,F  Ok to add vegetables to the diet as long as you do it consistently

## 2010-10-09 ENCOUNTER — Ambulatory Visit (INDEPENDENT_AMBULATORY_CARE_PROVIDER_SITE_OTHER): Payer: Medicare PPO | Admitting: *Deleted

## 2010-10-09 DIAGNOSIS — Z7901 Long term (current) use of anticoagulants: Secondary | ICD-10-CM

## 2010-10-09 DIAGNOSIS — I4891 Unspecified atrial fibrillation: Secondary | ICD-10-CM

## 2010-10-09 NOTE — Patient Instructions (Signed)
Return to office in 4 week for PT/INR  Continue current dose: 2 tabs daily except 3 tab on M,W,F

## 2010-11-02 ENCOUNTER — Other Ambulatory Visit: Payer: Self-pay | Admitting: Family Medicine

## 2010-11-08 ENCOUNTER — Ambulatory Visit (INDEPENDENT_AMBULATORY_CARE_PROVIDER_SITE_OTHER): Payer: Medicare PPO | Admitting: *Deleted

## 2010-11-08 DIAGNOSIS — Z7901 Long term (current) use of anticoagulants: Secondary | ICD-10-CM

## 2010-11-08 DIAGNOSIS — I4891 Unspecified atrial fibrillation: Secondary | ICD-10-CM

## 2010-11-08 NOTE — Patient Instructions (Signed)
Return to office in  2 weeks  New dosing: 2 tab daily except 3 tab on M,Tues, Thurs, Fri

## 2010-11-22 ENCOUNTER — Ambulatory Visit (INDEPENDENT_AMBULATORY_CARE_PROVIDER_SITE_OTHER): Payer: Medicare PPO | Admitting: *Deleted

## 2010-11-22 DIAGNOSIS — Z7901 Long term (current) use of anticoagulants: Secondary | ICD-10-CM

## 2010-11-22 DIAGNOSIS — I4891 Unspecified atrial fibrillation: Secondary | ICD-10-CM

## 2010-11-22 NOTE — Patient Instructions (Signed)
Pt rescheduled

## 2010-11-27 ENCOUNTER — Ambulatory Visit (INDEPENDENT_AMBULATORY_CARE_PROVIDER_SITE_OTHER): Payer: Medicare PPO | Admitting: *Deleted

## 2010-11-27 DIAGNOSIS — Z7901 Long term (current) use of anticoagulants: Secondary | ICD-10-CM

## 2010-11-27 DIAGNOSIS — I4891 Unspecified atrial fibrillation: Secondary | ICD-10-CM

## 2010-11-27 NOTE — Patient Instructions (Signed)
Return to office in 4 weeks for PT/INR  Continue current dose: Take 3 tab daily except 2 tab on wed,sat,sun

## 2010-12-11 ENCOUNTER — Inpatient Hospital Stay (HOSPITAL_COMMUNITY)
Admission: EM | Admit: 2010-12-11 | Discharge: 2010-12-23 | DRG: 193 | Disposition: A | Discharge status: Home Health | Payer: Medicare PPO | Source: Ambulatory Visit | Attending: Internal Medicine | Admitting: Internal Medicine

## 2010-12-11 ENCOUNTER — Emergency Department (HOSPITAL_COMMUNITY): Payer: Medicare PPO

## 2010-12-11 DIAGNOSIS — R197 Diarrhea, unspecified: Secondary | ICD-10-CM | POA: Diagnosis present

## 2010-12-11 DIAGNOSIS — D72829 Elevated white blood cell count, unspecified: Secondary | ICD-10-CM | POA: Diagnosis present

## 2010-12-11 DIAGNOSIS — D649 Anemia, unspecified: Secondary | ICD-10-CM | POA: Diagnosis present

## 2010-12-11 DIAGNOSIS — I4891 Unspecified atrial fibrillation: Secondary | ICD-10-CM | POA: Diagnosis present

## 2010-12-11 DIAGNOSIS — I10 Essential (primary) hypertension: Secondary | ICD-10-CM | POA: Diagnosis present

## 2010-12-11 DIAGNOSIS — J189 Pneumonia, unspecified organism: Principal | ICD-10-CM | POA: Diagnosis present

## 2010-12-11 DIAGNOSIS — E870 Hyperosmolality and hypernatremia: Secondary | ICD-10-CM | POA: Diagnosis present

## 2010-12-11 DIAGNOSIS — E876 Hypokalemia: Secondary | ICD-10-CM | POA: Diagnosis present

## 2010-12-11 DIAGNOSIS — I498 Other specified cardiac arrhythmias: Secondary | ICD-10-CM | POA: Diagnosis present

## 2010-12-11 DIAGNOSIS — N170 Acute kidney failure with tubular necrosis: Secondary | ICD-10-CM | POA: Diagnosis present

## 2010-12-11 DIAGNOSIS — M6282 Rhabdomyolysis: Secondary | ICD-10-CM | POA: Diagnosis present

## 2010-12-11 DIAGNOSIS — E872 Acidosis, unspecified: Secondary | ICD-10-CM | POA: Diagnosis present

## 2010-12-11 DIAGNOSIS — K921 Melena: Secondary | ICD-10-CM | POA: Diagnosis present

## 2010-12-11 DIAGNOSIS — E785 Hyperlipidemia, unspecified: Secondary | ICD-10-CM | POA: Diagnosis present

## 2010-12-11 DIAGNOSIS — Z7901 Long term (current) use of anticoagulants: Secondary | ICD-10-CM

## 2010-12-11 LAB — COMPREHENSIVE METABOLIC PANEL WITH GFR
ALT: 61 U/L — ABNORMAL HIGH (ref 0–35)
AST: 298 U/L — ABNORMAL HIGH (ref 0–37)
CO2: 26 meq/L (ref 19–32)
Calcium: 9.1 mg/dL (ref 8.4–10.5)
Chloride: 94 meq/L — ABNORMAL LOW (ref 96–112)
Creatinine, Ser: 2.33 mg/dL — ABNORMAL HIGH (ref 0.50–1.10)
GFR calc Af Amer: 22 mL/min — ABNORMAL LOW (ref >90)
GFR calc non Af Amer: 19 mL/min — ABNORMAL LOW (ref >90)
Glucose, Bld: 169 mg/dL — ABNORMAL HIGH (ref 70–99)
Total Bilirubin: 1.2 mg/dL (ref 0.3–1.2)

## 2010-12-11 LAB — TROPONIN I: Troponin I: 0.31 ng/mL (ref <0.30)

## 2010-12-11 LAB — PROTIME-INR
INR: 1.55 — ABNORMAL HIGH (ref 0.00–1.49)
Prothrombin Time: 18.9 seconds — ABNORMAL HIGH (ref 11.6–15.2)

## 2010-12-11 LAB — CBC
HCT: 37.5 % (ref 36.0–46.0)
Hemoglobin: 13.1 g/dL (ref 12.0–15.0)
MCH: 29.5 pg (ref 26.0–34.0)
MCHC: 34.9 g/dL (ref 30.0–36.0)
MCV: 84.5 fL (ref 78.0–100.0)
Platelets: 142 10*3/uL — ABNORMAL LOW (ref 150–400)
RBC: 4.44 MIL/uL (ref 3.87–5.11)
RDW: 14.7 % (ref 11.5–15.5)
WBC: 21 K/uL — ABNORMAL HIGH (ref 4.0–10.5)

## 2010-12-11 LAB — COMPREHENSIVE METABOLIC PANEL
Albumin: 3.4 g/dL — ABNORMAL LOW (ref 3.5–5.2)
Alkaline Phosphatase: 115 U/L (ref 39–117)
BUN: 47 mg/dL — ABNORMAL HIGH (ref 6–23)
Potassium: 3.1 mEq/L — ABNORMAL LOW (ref 3.5–5.1)
Sodium: 135 mEq/L (ref 135–145)
Total Protein: 7.5 g/dL (ref 6.0–8.3)

## 2010-12-11 LAB — DIFFERENTIAL
Basophils Absolute: 0 K/uL (ref 0.0–0.1)
Basophils Relative: 0 % (ref 0–1)
Eosinophils Absolute: 0 10*3/uL (ref 0.0–0.7)
Eosinophils Relative: 0 % (ref 0–5)
Lymphocytes Relative: 4 % — ABNORMAL LOW (ref 12–46)
Lymphs Abs: 0.8 K/uL (ref 0.7–4.0)
Monocytes Absolute: 0.9 10*3/uL (ref 0.1–1.0)
Monocytes Relative: 4 % (ref 3–12)
Neutro Abs: 19.3 K/uL — ABNORMAL HIGH (ref 1.7–7.7)
Neutrophils Relative %: 92 % — ABNORMAL HIGH (ref 43–77)

## 2010-12-12 ENCOUNTER — Inpatient Hospital Stay (HOSPITAL_COMMUNITY): Payer: Medicare PPO

## 2010-12-12 LAB — CBC
HCT: 35.3 % — ABNORMAL LOW (ref 36.0–46.0)
Hemoglobin: 12.5 g/dL (ref 12.0–15.0)
MCH: 29.6 pg (ref 26.0–34.0)
MCH: 28.9 pg (ref 26.0–34.0)
MCHC: 35.4 g/dL (ref 30.0–36.0)
MCV: 83.6 fL (ref 78.0–100.0)
Platelets: 143 10*3/uL — ABNORMAL LOW (ref 150–400)
Platelets: 141 10*3/uL — ABNORMAL LOW (ref 150–400)
RBC: 4.22 MIL/uL (ref 3.87–5.11)
RBC: 4.15 MIL/uL (ref 3.87–5.11)
RDW: 14.8 % (ref 11.5–15.5)
RDW: 15.1 % (ref 11.5–15.5)
WBC: 17 10*3/uL — ABNORMAL HIGH (ref 4.0–10.5)

## 2010-12-12 LAB — URINALYSIS, ROUTINE W REFLEX MICROSCOPIC
Glucose, UA: NEGATIVE mg/dL
Ketones, ur: 15 mg/dL — AB
Nitrite: POSITIVE — AB
Protein, ur: 100 mg/dL — AB
Specific Gravity, Urine: 1.02 (ref 1.005–1.030)
Urobilinogen, UA: 0.2 mg/dL (ref 0.0–1.0)
pH: 5 (ref 5.0–8.0)

## 2010-12-12 LAB — OCCULT BLOOD X 1 CARD TO LAB, STOOL: Fecal Occult Bld: POSITIVE

## 2010-12-12 LAB — URINE MICROSCOPIC-ADD ON

## 2010-12-12 LAB — CARDIAC PANEL(CRET KIN+CKTOT+MB+TROPI)
CK, MB: 99.8 ng/mL (ref 0.3–4.0)
CK, MB: 92.5 ng/mL (ref 0.3–4.0)
Relative Index: 0.7 (ref 0.0–2.5)
Relative Index: 0.6 (ref 0.0–2.5)
Relative Index: 1.2 (ref 0.0–2.5)
Total CK: 14840 U/L — ABNORMAL HIGH (ref 7–177)
Total CK: 15586 U/L — ABNORMAL HIGH (ref 7–177)
Troponin I: 0.32 ng/mL (ref <0.30)
Troponin I: <0.3 ng/mL (ref <0.30)

## 2010-12-12 LAB — DIFFERENTIAL
Eosinophils Absolute: 0 10*3/uL (ref 0.0–0.7)
Eosinophils Relative: 0 % (ref 0–5)
Lymphs Abs: 0.4 10*3/uL — ABNORMAL LOW (ref 0.7–4.0)
Monocytes Absolute: 0.6 10*3/uL (ref 0.1–1.0)
Monocytes Relative: 4 % (ref 3–12)

## 2010-12-12 LAB — COMPREHENSIVE METABOLIC PANEL
ALT: 75 U/L — ABNORMAL HIGH (ref 0–35)
CO2: 23 mEq/L (ref 19–32)
Calcium: 8.8 mg/dL (ref 8.4–10.5)
Creatinine, Ser: 2.57 mg/dL — ABNORMAL HIGH (ref 0.50–1.10)
GFR calc Af Amer: 19 mL/min — ABNORMAL LOW (ref >90)
GFR calc non Af Amer: 17 mL/min — ABNORMAL LOW (ref >90)
Glucose, Bld: 151 mg/dL — ABNORMAL HIGH (ref 70–99)
Total Bilirubin: 0.9 mg/dL (ref 0.3–1.2)

## 2010-12-12 LAB — PROTIME-INR
INR: 1.66 — ABNORMAL HIGH (ref 0.00–1.49)
Prothrombin Time: 19.9 seconds — ABNORMAL HIGH (ref 11.6–15.2)

## 2010-12-12 LAB — APTT: aPTT: 39 seconds — ABNORMAL HIGH (ref 24–37)

## 2010-12-12 LAB — CLOSTRIDIUM DIFFICILE BY PCR: Toxigenic C. Difficile by PCR: NEGATIVE

## 2010-12-12 LAB — PHOSPHORUS: Phosphorus: 2.5 mg/dL (ref 2.3–4.6)

## 2010-12-12 LAB — ABO/RH: ABO/RH(D): O NEG

## 2010-12-13 LAB — COMPREHENSIVE METABOLIC PANEL
ALT: 111 U/L — ABNORMAL HIGH (ref 0–35)
AST: 357 U/L — ABNORMAL HIGH (ref 0–37)
Albumin: 2.2 g/dL — ABNORMAL LOW (ref 3.5–5.2)
CO2: 17 mEq/L — ABNORMAL LOW (ref 19–32)
Chloride: 107 mEq/L (ref 96–112)
Creatinine, Ser: 3.66 mg/dL — ABNORMAL HIGH (ref 0.50–1.10)
GFR calc non Af Amer: 11 mL/min — ABNORMAL LOW (ref >90)
Potassium: 4 mEq/L (ref 3.5–5.1)
Sodium: 139 mEq/L (ref 135–145)
Total Bilirubin: 0.3 mg/dL (ref 0.3–1.2)

## 2010-12-13 LAB — PROTIME-INR
INR: 1.95 — ABNORMAL HIGH (ref 0.00–1.49)
Prothrombin Time: 22.6 seconds — ABNORMAL HIGH (ref 11.6–15.2)

## 2010-12-13 LAB — CBC
HCT: 31.7 % — ABNORMAL LOW (ref 36.0–46.0)
Hemoglobin: 10.7 g/dL — ABNORMAL LOW (ref 12.0–15.0)
MCH: 28.7 pg (ref 26.0–34.0)
MCV: 85 fL (ref 78.0–100.0)
Platelets: 137 10*3/uL — ABNORMAL LOW (ref 150–400)
RBC: 3.73 MIL/uL — ABNORMAL LOW (ref 3.87–5.11)
WBC: 10.7 10*3/uL — ABNORMAL HIGH (ref 4.0–10.5)

## 2010-12-13 LAB — DIFFERENTIAL
Eosinophils Absolute: 0 10*3/uL (ref 0.0–0.7)
Lymphocytes Relative: 7 % — ABNORMAL LOW (ref 12–46)
Lymphs Abs: 0.7 10*3/uL (ref 0.7–4.0)
Monocytes Relative: 7 % (ref 3–12)
Neutro Abs: 9.3 10*3/uL — ABNORMAL HIGH (ref 1.7–7.7)
Neutrophils Relative %: 87 % — ABNORMAL HIGH (ref 43–77)

## 2010-12-13 LAB — APTT: aPTT: 41 seconds — ABNORMAL HIGH (ref 24–37)

## 2010-12-13 LAB — LEGIONELLA ANTIGEN, URINE

## 2010-12-13 LAB — CARDIAC PANEL(CRET KIN+CKTOT+MB+TROPI)
Relative Index: 1.2 (ref 0.0–2.5)
Troponin I: <0.3 ng/mL (ref <0.30)

## 2010-12-13 LAB — PHOSPHORUS: Phosphorus: 3.5 mg/dL (ref 2.3–4.6)

## 2010-12-13 LAB — URINE CULTURE
Colony Count: NO GROWTH
Culture: NO GROWTH

## 2010-12-14 LAB — COMPREHENSIVE METABOLIC PANEL
ALT: 157 U/L — ABNORMAL HIGH (ref 0–35)
Alkaline Phosphatase: 63 U/L (ref 39–117)
CO2: 17 mEq/L — ABNORMAL LOW (ref 19–32)
Chloride: 108 mEq/L (ref 96–112)
GFR calc Af Amer: 9 mL/min — ABNORMAL LOW (ref >90)
GFR calc non Af Amer: 8 mL/min — ABNORMAL LOW (ref >90)
Glucose, Bld: 152 mg/dL — ABNORMAL HIGH (ref 70–99)
Potassium: 4.2 mEq/L (ref 3.5–5.1)
Sodium: 138 mEq/L (ref 135–145)
Total Bilirubin: 0.2 mg/dL — ABNORMAL LOW (ref 0.3–1.2)
Total Protein: 5.7 g/dL — ABNORMAL LOW (ref 6.0–8.3)

## 2010-12-14 LAB — CBC
Hemoglobin: 10.9 g/dL — ABNORMAL LOW (ref 12.0–15.0)
RBC: 3.81 MIL/uL — ABNORMAL LOW (ref 3.87–5.11)
WBC: 8 10*3/uL (ref 4.0–10.5)

## 2010-12-14 LAB — DIFFERENTIAL
Basophils Absolute: 0 10*3/uL (ref 0.0–0.1)
Basophils Relative: 0 % (ref 0–1)
Lymphocytes Relative: 9 % — ABNORMAL LOW (ref 12–46)
Neutro Abs: 6.7 10*3/uL (ref 1.7–7.7)
Neutrophils Relative %: 84 % — ABNORMAL HIGH (ref 43–77)

## 2010-12-14 LAB — PROTIME-INR: INR: 1.85 — ABNORMAL HIGH (ref 0.00–1.49)

## 2010-12-14 NOTE — H&P (Signed)
NAMEROBYNNE, [name] NO.:  [id number]  MEDICAL RECORD NO.:  [id number]  LOCATION:  4729                         FACILITY:  [facility]  PHYSICIAN:  Carlota Raspberry, MD         DATE OF BIRTH:  02-Jul-1929  DATE OF ADMISSION:  12/11/2010 DATE OF DISCHARGE:                             HISTORY & PHYSICAL   PRIMARY CARE PHYSICIAN:  Neena Rhymes, MD  CHIEF COMPLAINT:  Diarrhea and fevers.  HISTORY OF PRESENT ILLNESS:  This is a 77 year old female with a history of atrial fibrillation, on Coumadin; hypertension who presents with diarrhea and is found to be febrile with leukocytosis.  The patient was in her usual normal state of health until this past Saturday afternoon when she was at work and she told her boss that she was feeling very chilly.  She started having diarrhea on Saturday afternoon that was described as dark/black, but no evident blood and no abdominal pain.  The diarrhea persisted over the last couple of days and has been so frequent that it has disturbed her sleep, such that she is having to get up and go to the bathroom quite frequently.  Earlier today, she was feeling quite weak and lost her balance and had to sit down on the floor because of generalized weakness, at which point her family brought her into the emergency room for further evaluation.  In the ED, she had a temp of 97.4, blood pressure 131/79, pulse 81, respirations 18 and 96% on room air.  She spiked the temperature to 100.1 in the emergency room.  Lab work showed a white blood cell count of 21.  Her creatinine was 2.33, up from a baseline of 1.0-1.2.  There was question of lateral ST depressions, but these tracings are unavailable to me at present.  She had a chest x-ray, which was called as a right upper lobe rounded airspace disease, most likely a pneumonia. She was given ceftriaxone and azithromycin and a 500-mL bolus of fluids and 40 mEq of potassium chloride.  On arrival to the  floor, she has spiked the temperature to 101.3.  She is able to relate the above information and that her chief complaints are diarrhea and weakness.  She denies any other subjective fevers, chills, or night sweats and also denies cough, abdominal pain, other cardiopulmonary symptoms of difficulty breathing, chest pain or palpitations, nausea, vomiting and rash.  REVIEW OF SYSTEMS:  Otherwise extensively negative.  PAST MEDICAL HISTORY: 1. Atrial fibrillation, currently on Coumadin. 2. Hypertension. 3. Incarcerated ventral incisional hernia, status post laparoscopic     repair in January 2005. 4. Status post hysterectomy and oophorectomy. 5. Left knee medial meniscal tear, status post repair in December     2003. The patient denies diabetes, MIs, cardiac catheterization, strokes or cancers.  MEDICATIONS:  Reconciled by the pharmacy and the list includes: 1. Chlor-Trimeton for allergies. 2. Hydrochlorothiazide 50 mg daily at bedtime. 3. Warfarin 3 mg 1 tablet daily at bedtime (however, the patient     states that she takes ? 6 mg on Tuesday, Thursday, Saturday, Sunday     and ? 9 mg the other days.  Frankly,  it is a bit unclear to me). 4. Felodipine 10 mg daily. 5. Simvastatin 40 mg daily at bedtime.  ALLERGIES:  There are no known drug allergies.  SOCIAL HISTORY:  She lives at home in [place] by herself and is still quite independent in her activities of daily living and is still working.  She has a daughter and a son-in-law.  She is a never smoker. Does not drink and do any drugs.  FAMILY HISTORY:  Significant for mother with hypertension and 5 CVAs and father with hypertension.  PHYSICAL EXAMINATION:  VITAL SIGNS:  Temperature 101.3, pulse 79, respirations 20, blood pressure 155/74 and 90% on room air. GENERAL:  She is a large older lady, but it is actually younger than stated age, who is pleasant.  She is able to relate her history.  She is in no distress. HEENT:   Her pupils are round and equal at about 7-8 mm.  Her sclerae are clear and anicteric.  Her eyes are otherwise unremarkable.  Her mouth is fairly dry appearing, but without any gross lesions and otherwise unremarkable. LUNGS:  Fair to poor air movement, but I do not appreciate any gross adventitious lung sounds throughout. HEART:  Irregular with no murmurs or gallops or other adventitious heart sounds. ABDOMEN:  Soft, non peritoneal but it is with minimal amount of facial grimacing and tenderness fairly diffusely in her lower quadrants and mid periumbilical area.  Bowel sounds are positive. EXTREMITIES:  Warm and well perfused with no cyanosis or clubbing.  She has no bilateral lower extremity edema.  She has good muscle tone. NEUROLOGIC:  Grossly nonfocal.  She is alert, conversant, and able to relate her history.  She is moving her extremities and able to sit up in bed with no difficulty.  LAB WORK:  White blood cell count is 21.0 with 92% neutrophilia, no bandemia, hematocrit 37.5, platelets 142.  INR is 1.55.  Chemistry panel was significant for a potassium of 3.1, her bicarb is 26, her BUN and creatinine of 47 and 2.33 which is above her most recent baseline of 1.0 to 1.2.  LFTs show a T bili of 1.2, alk phos of 115, AST is elevated at 298, ALT is 61, total protein 7.5, albumin 3.4, calcium 9.1, troponin is 0.31 with no CK and CK-MB to compare to.  UA shows red, turbid moderate bili, 15 ketones, large blood (0-2 rbc's), 100 protein, positive nitrites with small leuk esterase, 11-20 wbc's and many bacteria.  RADIOGRAPHY:  Chest x-ray shows right upper lobe rounded airspace disease, most compatible with pneumonia; borderline cardiomegaly, but no obstruction or free air.  EKG:  Atrial fibrillation with normal axis.  QRSs are narrow at 88 msec with fair R-wave progression.  There is no gross ST-segment deviation. T-waves are flattened in the lateral and inferior leads.  There  are no priors to compare to and I am not quite sure what happened to the ED tracings.  IMPRESSION:  This is a 77 year old female with a history of atrial fibrillation, on Coumadin, and hypertension who presents with a several- day history of diarrhea and weakness and is found to have leukocytosis, acute renal failure, fevers and elevated LFTs. 1. Leukocytosis, transaminitis, and diarrhea:  A unifying diagnosis     for this constellation would be Legionella pneumonia.  However,     also considered colitis, and the patient also appears to have a     urinary tract infection. 2. Overnight, we will treat her with levofloxacin for  the Legionella,     which would also have some reasonable gram-negative coverage for     colitis or UTI as well.  We will get a Legionella urinary antigen,     a C. diff antigen, stool culture and a urine culture.  We will     treat with Tylenol and fluids as well. 3. Elevated troponin:  This is only very minimally elevated and I     suspect it is likely due to acute renal failure in the setting of     infection.  She has no chest pain or other cardiopulmonary symptoms     and her EKG is thoroughly unremarkable, other than atrial     fibrillation.  We will call the lab to add on a CK and CK-MB to her     labs, continue rule out MI with enzymes and trend her EKGs. 4. Acute renal failure:  We will give her IV fluids and trend her     BMETs for now.  This is likely prerenal due to all of her diarrhea. 5. Atrial fibrillation:  Her INR is subtherapeutic.  We will trend her     coags and continue Coumadin per pharmacy.  Of note, I am not 100%     sure what her Coumadin dosing is at present. 6. Hypokalemia:  She is status post 40 of K in the ED.  We will give     her 2 more rounds of 40, oral. 7. Hypertension:  The patient takes felodipine and hydrochlorothiazide     at home.  I will hold these for now given her acutely suspected     infected state, but these should be  restarted when she is     discharged. 8. Hyperlipidemia:  I will continue home simvastatin. 9. Fluids, electrolytes, and nutrition:  We will give her 2 L of     normal saline overnight and trend her BMETs, also heart-healthy     diet. 10.Prophylaxis:  Subcutaneous heparin, discontinue if ambulatory; no     bowel regimen given diarrhea; Tylenol for fever and pains. 11.IV access:  She has a small bore peripheral in her left arm. 12.Code status:  She is a full code as I discussed with her. She will be admitted to telemetry team 5.          ______________________________ Carlota Raspberry, MD     EB/MEDQ  D:  12/12/2010  T:  12/12/2010  Job:  [id number]  Electronically Signed by Carlota Raspberry MD on 12/14/2010 07:51:11 PM

## 2010-12-15 LAB — URINALYSIS, ROUTINE W REFLEX MICROSCOPIC
Bilirubin Urine: NEGATIVE
Ketones, ur: NEGATIVE mg/dL
Nitrite: NEGATIVE
Protein, ur: NEGATIVE mg/dL
Urobilinogen, UA: 0.2 mg/dL (ref 0.0–1.0)
pH: 5 (ref 5.0–8.0)

## 2010-12-15 LAB — PHOSPHORUS: Phosphorus: 5.3 mg/dL — ABNORMAL HIGH (ref 2.3–4.6)

## 2010-12-15 LAB — APTT: aPTT: 37 seconds (ref 24–37)

## 2010-12-15 LAB — DIFFERENTIAL
Basophils Absolute: 0 10*3/uL (ref 0.0–0.1)
Lymphocytes Relative: 11 % — ABNORMAL LOW (ref 12–46)
Monocytes Absolute: 0.6 10*3/uL (ref 0.1–1.0)
Neutro Abs: 7.2 10*3/uL (ref 1.7–7.7)

## 2010-12-15 LAB — PROTIME-INR: INR: 1.79 — ABNORMAL HIGH (ref 0.00–1.49)

## 2010-12-15 LAB — CBC
HCT: 32.5 % — ABNORMAL LOW (ref 36.0–46.0)
Hemoglobin: 10.8 g/dL — ABNORMAL LOW (ref 12.0–15.0)
MCHC: 33.2 g/dL (ref 30.0–36.0)

## 2010-12-15 LAB — COMPREHENSIVE METABOLIC PANEL
AST: 206 U/L — ABNORMAL HIGH (ref 0–37)
Albumin: 2.5 g/dL — ABNORMAL LOW (ref 3.5–5.2)
Alkaline Phosphatase: 66 U/L (ref 39–117)
Chloride: 110 mEq/L (ref 96–112)
Potassium: 4.5 mEq/L (ref 3.5–5.1)
Total Bilirubin: 0.3 mg/dL (ref 0.3–1.2)

## 2010-12-15 LAB — OSMOLALITY, URINE: Osmolality, Ur: 292 mOsm/kg — ABNORMAL LOW (ref 390–1090)

## 2010-12-15 LAB — CREATININE, URINE, RANDOM: Creatinine, Urine: 35.32 mg/dL

## 2010-12-15 LAB — SODIUM, URINE, RANDOM: Sodium, Ur: 63 mEq/L

## 2010-12-16 LAB — CROSSMATCH
ABO/RH(D): O NEG
Antibody Screen: NEGATIVE
Unit division: 0
Unit division: 0

## 2010-12-16 LAB — CLOSTRIDIUM DIFFICILE BY PCR: Toxigenic C. Difficile by PCR: NEGATIVE

## 2010-12-16 LAB — COMPREHENSIVE METABOLIC PANEL
Albumin: 2.1 g/dL — ABNORMAL LOW (ref 3.5–5.2)
Alkaline Phosphatase: 54 U/L (ref 39–117)
BUN: 101 mg/dL — ABNORMAL HIGH (ref 6–23)
Calcium: 7.7 mg/dL — ABNORMAL LOW (ref 8.4–10.5)
GFR calc Af Amer: 7 mL/min — ABNORMAL LOW (ref >90)
Glucose, Bld: 108 mg/dL — ABNORMAL HIGH (ref 70–99)
Potassium: 4.2 mEq/L (ref 3.5–5.1)
Sodium: 140 mEq/L (ref 135–145)
Total Protein: 5.3 g/dL — ABNORMAL LOW (ref 6.0–8.3)

## 2010-12-16 LAB — DIFFERENTIAL
Basophils Absolute: 0 10*3/uL (ref 0.0–0.1)
Basophils Relative: 0 % (ref 0–1)
Lymphocytes Relative: 8 % — ABNORMAL LOW (ref 12–46)
Monocytes Absolute: 0.8 10*3/uL (ref 0.1–1.0)
Neutro Abs: 6.8 10*3/uL (ref 1.7–7.7)
Neutrophils Relative %: 80 % — ABNORMAL HIGH (ref 43–77)

## 2010-12-16 LAB — CBC
HCT: 27.7 % — ABNORMAL LOW (ref 36.0–46.0)
Hemoglobin: 9.4 g/dL — ABNORMAL LOW (ref 12.0–15.0)
MCHC: 33.9 g/dL (ref 30.0–36.0)
RDW: 16.5 % — ABNORMAL HIGH (ref 11.5–15.5)
WBC: 8.5 10*3/uL (ref 4.0–10.5)

## 2010-12-16 LAB — STOOL CULTURE

## 2010-12-16 LAB — PROTIME-INR
INR: 1.79 — ABNORMAL HIGH (ref 0.00–1.49)
Prothrombin Time: 21.1 seconds — ABNORMAL HIGH (ref 11.6–15.2)

## 2010-12-16 LAB — MAGNESIUM: Magnesium: 2 mg/dL (ref 1.5–2.5)

## 2010-12-16 LAB — APTT: aPTT: 39 seconds — ABNORMAL HIGH (ref 24–37)

## 2010-12-17 LAB — COMPREHENSIVE METABOLIC PANEL
AST: 81 U/L — ABNORMAL HIGH (ref 0–37)
Albumin: 2.2 g/dL — ABNORMAL LOW (ref 3.5–5.2)
CO2: 23 mEq/L (ref 19–32)
Calcium: 7.4 mg/dL — ABNORMAL LOW (ref 8.4–10.5)
Creatinine, Ser: 6.14 mg/dL — ABNORMAL HIGH (ref 0.50–1.10)
GFR calc non Af Amer: 6 mL/min — ABNORMAL LOW (ref >90)
Sodium: 141 mEq/L (ref 135–145)
Total Protein: 5.3 g/dL — ABNORMAL LOW (ref 6.0–8.3)

## 2010-12-17 LAB — PROTIME-INR
INR: 1.56 — ABNORMAL HIGH (ref 0.00–1.49)
Prothrombin Time: 19 seconds — ABNORMAL HIGH (ref 11.6–15.2)

## 2010-12-17 LAB — CBC
Hemoglobin: 9.1 g/dL — ABNORMAL LOW (ref 12.0–15.0)
Platelets: 180 10*3/uL (ref 150–400)
RBC: 3.15 MIL/uL — ABNORMAL LOW (ref 3.87–5.11)
WBC: 8.1 10*3/uL (ref 4.0–10.5)

## 2010-12-17 LAB — DIFFERENTIAL
Basophils Absolute: 0 10*3/uL (ref 0.0–0.1)
Basophils Relative: 0 % (ref 0–1)
Eosinophils Absolute: 0.2 10*3/uL (ref 0.0–0.7)
Neutro Abs: 6.2 10*3/uL (ref 1.7–7.7)
Neutrophils Relative %: 77 % (ref 43–77)

## 2010-12-17 LAB — CK: Total CK: 1136 U/L — ABNORMAL HIGH (ref 7–177)

## 2010-12-17 LAB — PHOSPHORUS: Phosphorus: 5.9 mg/dL — ABNORMAL HIGH (ref 2.3–4.6)

## 2010-12-17 LAB — APTT: aPTT: 40 seconds — ABNORMAL HIGH (ref 24–37)

## 2010-12-18 LAB — CBC
Hemoglobin: 9.5 g/dL — ABNORMAL LOW (ref 12.0–15.0)
MCHC: 34.5 g/dL (ref 30.0–36.0)
RDW: 15.9 % — ABNORMAL HIGH (ref 11.5–15.5)
WBC: 9.6 10*3/uL (ref 4.0–10.5)

## 2010-12-18 LAB — COMPREHENSIVE METABOLIC PANEL
AST: 61 U/L — ABNORMAL HIGH (ref 0–37)
Albumin: 2.2 g/dL — ABNORMAL LOW (ref 3.5–5.2)
Alkaline Phosphatase: 60 U/L (ref 39–117)
BUN: 112 mg/dL — ABNORMAL HIGH (ref 6–23)
Potassium: 3.6 mEq/L (ref 3.5–5.1)
Sodium: 143 mEq/L (ref 135–145)
Total Protein: 5.6 g/dL — ABNORMAL LOW (ref 6.0–8.3)

## 2010-12-18 LAB — DIFFERENTIAL
Basophils Absolute: 0.1 10*3/uL (ref 0.0–0.1)
Lymphs Abs: 0.9 10*3/uL (ref 0.7–4.0)
Monocytes Relative: 9 % (ref 3–12)
Neutrophils Relative %: 79 % — ABNORMAL HIGH (ref 43–77)

## 2010-12-18 LAB — MAGNESIUM: Magnesium: 1.6 mg/dL (ref 1.5–2.5)

## 2010-12-18 LAB — PROTIME-INR: INR: 1.66 — ABNORMAL HIGH (ref 0.00–1.49)

## 2010-12-18 LAB — CULTURE, BLOOD (ROUTINE X 2)
Culture  Setup Time: 201210161045
Culture: NO GROWTH

## 2010-12-18 LAB — OCCULT BLOOD X 1 CARD TO LAB, STOOL: Fecal Occult Bld: POSITIVE

## 2010-12-18 LAB — CK: Total CK: 703 U/L — ABNORMAL HIGH (ref 7–177)

## 2010-12-18 LAB — APTT: aPTT: 37 seconds (ref 24–37)

## 2010-12-19 LAB — PROTEIN ELECTROPHORESIS, SERUM
Albumin ELP: 49.6 % — ABNORMAL LOW (ref 55.8–66.1)
Alpha-1-Globulin: 9.5 % — ABNORMAL HIGH (ref 2.9–4.9)
Gamma Globulin: 10 % — ABNORMAL LOW (ref 11.1–18.8)

## 2010-12-19 LAB — COMPREHENSIVE METABOLIC PANEL
AST: 43 U/L — ABNORMAL HIGH (ref 0–37)
Albumin: 2.2 g/dL — ABNORMAL LOW (ref 3.5–5.2)
Calcium: 6.9 mg/dL — ABNORMAL LOW (ref 8.4–10.5)
Creatinine, Ser: 5.73 mg/dL — ABNORMAL HIGH (ref 0.50–1.10)

## 2010-12-19 LAB — APTT: aPTT: 39 seconds — ABNORMAL HIGH (ref 24–37)

## 2010-12-19 LAB — CBC
MCH: 28.6 pg (ref 26.0–34.0)
MCV: 84.3 fL (ref 78.0–100.0)
Platelets: 205 10*3/uL (ref 150–400)
RDW: 15.9 % — ABNORMAL HIGH (ref 11.5–15.5)
WBC: 8.5 10*3/uL (ref 4.0–10.5)

## 2010-12-19 LAB — OCCULT BLOOD X 1 CARD TO LAB, STOOL: Fecal Occult Bld: NEGATIVE

## 2010-12-19 LAB — DIFFERENTIAL
Eosinophils Absolute: 0.1 10*3/uL (ref 0.0–0.7)
Eosinophils Relative: 2 % (ref 0–5)
Lymphs Abs: 0.9 10*3/uL (ref 0.7–4.0)
Monocytes Relative: 9 % (ref 3–12)

## 2010-12-19 LAB — ANTI-NEUTROPHIL ANTIBODY

## 2010-12-19 LAB — PHOSPHORUS: Phosphorus: 7 mg/dL — ABNORMAL HIGH (ref 2.3–4.6)

## 2010-12-20 LAB — PROTIME-INR: INR: 2 — ABNORMAL HIGH (ref 0.00–1.49)

## 2010-12-20 LAB — APTT: aPTT: 38 seconds — ABNORMAL HIGH (ref 24–37)

## 2010-12-20 LAB — DIFFERENTIAL
Eosinophils Absolute: 0.1 10*3/uL (ref 0.0–0.7)
Eosinophils Relative: 1 % (ref 0–5)
Lymphs Abs: 1.4 10*3/uL (ref 0.7–4.0)
Monocytes Absolute: 0.8 10*3/uL (ref 0.1–1.0)

## 2010-12-20 LAB — CBC
MCHC: 33.3 g/dL (ref 30.0–36.0)
MCV: 85.2 fL (ref 78.0–100.0)
Platelets: 191 10*3/uL (ref 150–400)
RDW: 15.9 % — ABNORMAL HIGH (ref 11.5–15.5)
WBC: 9 10*3/uL (ref 4.0–10.5)

## 2010-12-20 LAB — MAGNESIUM: Magnesium: 1.8 mg/dL (ref 1.5–2.5)

## 2010-12-20 LAB — COMPREHENSIVE METABOLIC PANEL
AST: 47 U/L — ABNORMAL HIGH (ref 0–37)
Albumin: 2.2 g/dL — ABNORMAL LOW (ref 3.5–5.2)
Calcium: 6.8 mg/dL — ABNORMAL LOW (ref 8.4–10.5)
Chloride: 97 mEq/L (ref 96–112)
Creatinine, Ser: 4.9 mg/dL — ABNORMAL HIGH (ref 0.50–1.10)
Total Bilirubin: 0.3 mg/dL (ref 0.3–1.2)
Total Protein: 5.9 g/dL — ABNORMAL LOW (ref 6.0–8.3)

## 2010-12-20 LAB — PHOSPHORUS: Phosphorus: 6.5 mg/dL — ABNORMAL HIGH (ref 2.3–4.6)

## 2010-12-21 DIAGNOSIS — I498 Other specified cardiac arrhythmias: Secondary | ICD-10-CM

## 2010-12-21 LAB — COMPREHENSIVE METABOLIC PANEL
ALT: 68 U/L — ABNORMAL HIGH (ref 0–35)
Albumin: 2.3 g/dL — ABNORMAL LOW (ref 3.5–5.2)
Alkaline Phosphatase: 52 U/L (ref 39–117)
BUN: 83 mg/dL — ABNORMAL HIGH (ref 6–23)
Chloride: 96 mEq/L (ref 96–112)
GFR calc Af Amer: 11 mL/min — ABNORMAL LOW (ref >90)
Glucose, Bld: 136 mg/dL — ABNORMAL HIGH (ref 70–99)
Potassium: 3.3 mEq/L — ABNORMAL LOW (ref 3.5–5.1)
Sodium: 142 mEq/L (ref 135–145)
Total Bilirubin: 0.3 mg/dL (ref 0.3–1.2)

## 2010-12-21 LAB — MAGNESIUM: Magnesium: 1.5 mg/dL (ref 1.5–2.5)

## 2010-12-21 LAB — CBC
HCT: 27.7 % — ABNORMAL LOW (ref 36.0–46.0)
Hemoglobin: 9.1 g/dL — ABNORMAL LOW (ref 12.0–15.0)
MCV: 86 fL (ref 78.0–100.0)
RBC: 3.22 MIL/uL — ABNORMAL LOW (ref 3.87–5.11)
WBC: 8.6 10*3/uL (ref 4.0–10.5)

## 2010-12-21 LAB — DIFFERENTIAL
Basophils Absolute: 0 10*3/uL (ref 0.0–0.1)
Lymphocytes Relative: 11 % — ABNORMAL LOW (ref 12–46)
Lymphs Abs: 0.9 10*3/uL (ref 0.7–4.0)
Neutro Abs: 6.7 10*3/uL (ref 1.7–7.7)
Neutrophils Relative %: 77 % (ref 43–77)

## 2010-12-21 NOTE — Consult Note (Signed)
[name], [name] NO.:  [id number]  MEDICAL RECORD NO.:  [id number]  LOCATION:  4729                         FACILITY:  [facility]  PHYSICIAN:  Bernette Redbird, M.D.   DATE OF BIRTH:  [birth year]  DATE OF CONSULTATION:  12/13/2010 DATE OF DISCHARGE:                                CONSULTATION   Dr. Kaylyn Layer of the [facility] hospitalists asked [place] to see this very pleasant 77 year old female because of possible GI bleeding.  The patient was admitted to the hospital a couple of days ago with severe chills and dark diarrhea and was noted to be Hemoccult positive on admission.  In the meantime, multiple lab abnormalities have evolved, including acute renal failure, and evidence of rhabdomyolysis, with her CK levels in the 15,000 range, with minimal borderline elevation of troponin's.  Her diarrhea has slacked up, with just a few bowel movements yesterday and a couple of bowel movements so far, this morning, which are becoming less dark in character.  Her hemoglobin has drifted gradually downward from 13.1 on admission to a current level of 10.7.  The patient is on Coumadin as an outpatient for atrial fibrillation, but her INR on admission was subtherapeutic at 1.55 and, on repeat, it has come up to 1.95.  However, both Coumadin and heparin are being held at the moment.  Note that, the patient's C. difficile toxin came back negative, although occult blood was positive.  The patient is not on any aspirin, nonsteroidal anti-inflammatory drugs or other ulcerogenic medications as an outpatient.  PAST MEDICAL HISTORY:  No known allergies.  OUTPATIENT MEDICATIONS:  Antihistamines, felodipine, simvastatin and hydrochlorothiazide.  OPERATIONS:  Laparoscopic repair of an incarcerated ventral hernia in 2005, status post previous hysterectomy, oophorectomy, also orthopedic operations.  MEDICAL ILLNESSES:  There is a past history of postmenopausal bleeding, she has a history of  atrial fibrillation on chronic Coumadin, hypertension, no known coronary artery disease, diabetes or COPD.  HABITS:  Nonsmoker, nondrinker.  FAMILY HISTORY:  Negative for GI illnesses in the immediate family, such as colon cancer, gallstones, ulcers or liver disease.  SOCIAL HISTORY:  Lives alone, despite her age of 74 still works as many as 30 hours a week at [employer].  REVIEW OF SYSTEMS:  Negative for any ongoing prodromal upper or lower GI tract symptoms such as dysphagia, anorexia, weight loss, heartburn, reflux, nausea, abdominal pain, constipation, diarrhea or rectal bleeding.  Note that, she did not have any burgundy or red stools with her recent dark stools at the time of this admission.  PHYSICAL EXAMINATION:  VITAL SIGNS:  Unremarkable, anicteric, no pallor. CHEST:  Clear. HEART:  Normal. ABDOMEN:  Active bowel sounds and is somewhat adipose, but without organomegaly, guarding, mass or tenderness. NEUROLOGICALLY:  Grossly intact.  Fecal occult blood was positive on admission and was not repeated.  LABORATORY DATA:  Hemoglobin has dropped from 13.1 to a current level of 10.7 since admission.  White count 10700 at this time, platelets 137,000, differential count shows slight neutrophilia.  INR most recently was 1.95, BUN on admission was 47 with creatinine 2.33, currently 77 with creatinine of 3.7.  CK yesterday was 15,000, today is 10,000.  Troponin's were minimally or marginally elevated at 0.32 around the time of admission, probably not clinically significant.  Three way abdomen on admission showed evidence of pneumonia without evidence of obstruction or free air.  IMPRESSION: 1. Acute illness with leukocytosis and chills and radiographic     findings compatible with pneumonia. 2. Probable rhabdomyolysis with associated acute renal failure. 3. Heme-positive dark diarrhea without dramatic drop in blood count,     stools no lightening up.  Cannot rule out  transient mild     gastrointestinal bleed, versus occult heme positivity. 4. Mild-to-moderate elevation of liver chemistries, probably related     to rhabdomyolysis, AST has been running around 300-400, ALT around     60-100.  Albumin 2.2, platelets borderline low at 137,000, cannot     exclude possible occult liver disease or even cirrhosis, but doubt.     Note that, liver chemistries were completely normal in February of     this year, at which time, platelets were normal at 157,000 and     albumin was normal at 4.1, making clinically significant chronic     liver disease unlikely.  DISCUSSION AND RECOMMENDATIONS: 1. I do not think that the patient is having an acute GI bleed,     despite her history of dark stools and heme positivity with a mild     drop in hemoglobin.  Recommend advancing diet, continuing PPI     therapy which can now be changed to oral, probiotic therapy while     on antibiotics to hopefully decrease both her diarrhea and risk of     getting C. diff infection, and hold off on GI tract evaluation for     now in view of her coexisting acute issues including rhabdomyolysis     and renal failure.  Given the absence of an active GI bleed at this    time, I feel it is better not to put her through GI procedures.  I     would favor monitoring hemoccults and hemoglobin level as an     outpatient with consideration of endoscopy and colonoscopy if she     has persistent anemia or heme positivity.  She has never had a     screening colonoscopy, but given her advanced age, routine     screening is not really required, but evaluation for heme     positivity, if it proves to be an ongoing finding, would be     prudent. 2. If possible, I would wait until she becomes Hemoccult negative     before restarting her anticoagulation.          ______________________________ Bernette Redbird, M.D.     RB/MEDQ  D:  12/13/2010  T:  12/13/2010  Job:  [id number]  Electronically Signed by  Bernette Redbird M.D. on 12/21/2010 02:49:45 PM

## 2010-12-22 DIAGNOSIS — I495 Sick sinus syndrome: Secondary | ICD-10-CM

## 2010-12-22 LAB — CBC
HCT: 28.3 % — ABNORMAL LOW (ref 36.0–46.0)
Hemoglobin: 9.3 g/dL — ABNORMAL LOW (ref 12.0–15.0)
MCV: 86.5 fL (ref 78.0–100.0)
RDW: 15.5 % (ref 11.5–15.5)
WBC: 7.3 10*3/uL (ref 4.0–10.5)

## 2010-12-22 LAB — COMPREHENSIVE METABOLIC PANEL
Alkaline Phosphatase: 53 U/L (ref 39–117)
BUN: 66 mg/dL — ABNORMAL HIGH (ref 6–23)
CO2: 33 mEq/L — ABNORMAL HIGH (ref 19–32)
Chloride: 98 mEq/L (ref 96–112)
Creatinine, Ser: 3.12 mg/dL — ABNORMAL HIGH (ref 0.50–1.10)
GFR calc Af Amer: 15 mL/min — ABNORMAL LOW (ref >90)
GFR calc non Af Amer: 13 mL/min — ABNORMAL LOW (ref >90)
Glucose, Bld: 133 mg/dL — ABNORMAL HIGH (ref 70–99)
Potassium: 3 mEq/L — ABNORMAL LOW (ref 3.5–5.1)
Total Bilirubin: 0.3 mg/dL (ref 0.3–1.2)

## 2010-12-22 LAB — PROTIME-INR: INR: 2.94 — ABNORMAL HIGH (ref 0.00–1.49)

## 2010-12-22 LAB — DIFFERENTIAL
Basophils Absolute: 0 10*3/uL (ref 0.0–0.1)
Eosinophils Relative: 1 % (ref 0–5)
Lymphocytes Relative: 9 % — ABNORMAL LOW (ref 12–46)
Lymphs Abs: 0.7 10*3/uL (ref 0.7–4.0)
Neutro Abs: 5.7 10*3/uL (ref 1.7–7.7)

## 2010-12-22 LAB — PHOSPHORUS: Phosphorus: 4.9 mg/dL — ABNORMAL HIGH (ref 2.3–4.6)

## 2010-12-22 LAB — MAGNESIUM: Magnesium: 1.4 mg/dL — ABNORMAL LOW (ref 1.5–2.5)

## 2010-12-22 LAB — APTT: aPTT: 50 seconds — ABNORMAL HIGH (ref 24–37)

## 2010-12-22 NOTE — Discharge Summary (Signed)
[name], BOVARD                 ACCOUNT NO.:  [id number]  MEDICAL RECORD NO.:  [id number]  LOCATION:  4734                         FACILITY:  [facility]  PHYSICIAN:  Manson Passey, MD        DATE OF BIRTH:  [birth year]  DATE OF ADMISSION:  12/11/2010 DATE OF DISCHARGE:                              DISCHARGE SUMMARY   PRIMARY CARE PHYSICIAN:  Neena Rhymes, MD  DISCHARGE DIAGNOSES: 1. Community-acquired pneumonia. 2. Acute renal failure secondary to tubular necrosis secondary to     rhabdomyolysis. 3. Atrial fibrillation, on anticoagulation (Coumadin). 4. Hypertension.  CONSULTATIONS: 1. Nephrology. 2. Gastroenterology.  DIAGNOSTIC STUDIES: 1. December 12, 2010, renal ultrasound.  Findings of negative exam. 2. December 11, 2010, abdominal x-ray which shows right upper lobe     rounded airspace disease compatible with pneumonia.  Borderline     cardiomegaly.  No destruction or free air.  DISCHARGE LABORATORY DATA:  Sodium 142, potassium 3.2, chloride 95, bicarb 31, glucose 130, BUN 105, creatinine 5.783, total protein 5.5, albumin 2.2, calcium 6.9, phosphorus 7 and magnesium 1.6.  Blood cultures from December 12, 2010, shows no growth to date.  CPK 73.  Of note, he has trended down from the initial value of 1136.  DISCHARGE MEDICATIONS: 1. Tylenol 650 mg every 4 h. as needed. 2. Protonix 40 mg twice daily. 3. Chlor-Trimeton 1 tablet every 4 h. as needed for allergies. 4. Amlodipine 10 mg daily. 5. Hydrochlorothiazide 50 mg daily at bedtime. 6. Simvastatin 40 mg daily at bedtime. 7. Coumadin.  (Of note, please add to the addendum to the final     Coumadin dosage that the patient will be discharged).  HISTORY OF PRESENT ILLNESS:  The patient is a 77 year old female with multiple comorbidities who presented to emergency room with complaints of diarrhea and was found to be febrile with leukocytosis.  Prior to admission, the patient reports feeling subjective fevers at home  and chills and having diarrhea which was dark in color, with no evident blood.  The patient had no complaints of abdominal pain.  Diarrhea persisted over a couple of days prior to admission.  She also reports feeling weak and dizzy since she started having diarrhea, but there was no loss of consciousness.  The patient was admitted to the hospitalist service for further evaluation and management.  PHYSICAL EXAMINATION:  VITAL SIGNS:  Blood pressure 142/76, pulse 75, respirations 17, temperature 98.5 and oxygen saturation 92% on room air. GENERAL APPEARANCE:  No acute distress, the patient appears comfortable. NECK:  Supple, no lymphadenopathy. SKIN:  Warm, dry. LUNGS:  Clear to auscultation bilaterally, no wheezing, no rales and no rhonchi. CARDIOVASCULAR:  Positive S1, S2, irregular rhythm with controlled rate. ABDOMEN:  Positive bowel sounds, soft, nontender/nondistended. EXTREMITIES:  Pulses palpable bilaterally, no lower extremity edema. NEUROLOGIC:  Alert, awake and oriented times 3, no focal neurologic deficits.  HOSPITAL COURSE BY PROBLEM: 1. Community-acquired pneumonia.  The patient was initially on     azithromycin and ceftriaxone.  The medication started in the     emergency room, but once the patient was on floor, the patient was     started on levofloxacin.  Due to worsening renal function, the     decision was made to switch the patient to Avelox.  She has     received antibiotics since October 15, but today December 19, 2010,     is day #9, so we will discontinue Avelox.  The patient had no     complaints of cough or shortness of breath or chest tightness at     present.  She is saturating 92% on room air. 2. Diarrhea.  At present, her ileus resolved.  When the patient was     admitted, stool for C. diff was sent and the final results are     negative. 3. Acute kidney injury, likely secondary to acute tubular necrosis     secondary to dehydration, rhabdomyolysis and  pneumonia.  Nephrology     was consulted for input on management.  CPK was checked and it is     703 on December 19, 2010, which trended down from 1100.  At present,     the patient's creatinine is trending down.  As per renal team,     there is no need for urgent hemodialysis at present.  The patient     needs to follow up with primary care physician to reassess the     renal function. 4. Hypertension.  The patient's blood pressure is 142/76.  The patient     can resume taking home medications once she is discharged home. 5. Atrial fibrillation on Coumadin.  INR December 19, 2010, is 1.69.     The patient was on Coumadin while in hospital and will be     discharged home with Coumadin, and final addendum to the dosage the     patient will be discharged with. 6. Dyslipidemia.  Please continue simvastatin 40 mg at bedtime. 7. Gastroesophageal reflux disease.  Please continue Protonix 40 mg     twice daily.  CONDITION ON DISCHARGE:  The patient was stable and appears clinically well to be discharged home.  EDUCATION:  The patient is aware of plan of care and treatment.  Time spent discharging the patient more than 35 minutes.          ______________________________ Manson Passey, MD     AD/MEDQ  D:  12/19/2010  T:  12/20/2010  Job:  [id number]  Electronically Signed by Manson Passey MD on 12/22/2010 03:14:00 PM

## 2010-12-23 LAB — MAGNESIUM: Magnesium: 1.5 mg/dL (ref 1.5–2.5)

## 2010-12-23 LAB — DIFFERENTIAL
Basophils Absolute: 0 10*3/uL (ref 0.0–0.1)
Basophils Relative: 1 % (ref 0–1)
Eosinophils Absolute: 0.1 10*3/uL (ref 0.0–0.7)
Eosinophils Relative: 2 % (ref 0–5)
Monocytes Absolute: 0.6 10*3/uL (ref 0.1–1.0)
Monocytes Relative: 11 % (ref 3–12)
Neutro Abs: 3.6 10*3/uL (ref 1.7–7.7)

## 2010-12-23 LAB — COMPREHENSIVE METABOLIC PANEL
Alkaline Phosphatase: 54 U/L (ref 39–117)
BUN: 52 mg/dL — ABNORMAL HIGH (ref 6–23)
Chloride: 102 mEq/L (ref 96–112)
Creatinine, Ser: 2.35 mg/dL — ABNORMAL HIGH (ref 0.50–1.10)
GFR calc Af Amer: 22 mL/min — ABNORMAL LOW (ref >90)
Glucose, Bld: 131 mg/dL — ABNORMAL HIGH (ref 70–99)
Potassium: 3.8 mEq/L (ref 3.5–5.1)
Total Bilirubin: 0.3 mg/dL (ref 0.3–1.2)
Total Protein: 6.4 g/dL (ref 6.0–8.3)

## 2010-12-23 LAB — CBC
Hemoglobin: 10.1 g/dL — ABNORMAL LOW (ref 12.0–15.0)
MCH: 28.2 pg (ref 26.0–34.0)
MCHC: 32.7 g/dL (ref 30.0–36.0)
RDW: 15.5 % (ref 11.5–15.5)

## 2010-12-23 LAB — PROTIME-INR
INR: 2.81 — ABNORMAL HIGH (ref 0.00–1.49)
Prothrombin Time: 30 seconds — ABNORMAL HIGH (ref 11.6–15.2)

## 2010-12-23 LAB — PHOSPHORUS: Phosphorus: 4.4 mg/dL (ref 2.3–4.6)

## 2010-12-27 ENCOUNTER — Ambulatory Visit: Payer: Medicare PPO

## 2010-12-28 NOTE — Consult Note (Signed)
[name], [name]                 ACCOUNT NO.:  [id number]  MEDICAL RECORD NO.:  [id number]  LOCATION:  4729                         FACILITY:  [facility]  PHYSICIAN:  Dyke Maes, M.D.DATE OF BIRTH:  03/04/1929  DATE OF CONSULTATION:  12/15/2010 DATE OF DISCHARGE:                                CONSULTATION   REFERRING PHYSICIAN:  Manson Passey, MD  REASON FOR CONSULT:  Acute renal failure.  HISTORY OF PRESENT ILLNESS:  This is a 77 year old white female with no known history of renal disease who was admitted on December 12, 2010, for diarrhea, weakness, and chills.  Her chest x-ray at the time of admission did show right upper lobe rounded airspace infiltrate.  The patient denies any history of gross hematuria, kidney stones, or use of nonsteroidal medications.  Renal ultrasound done during this hospitalization showed right kidney of 11.3 cm, left kidney of 10.9 cm, no hydronephrosis.  She does have a history of hypertension for 10 years or so, but it was only taken hydrochlorothiazide and felodipine.  She was not on an ACE inhibitor or ARB.  In terms of her renal function, her serum creatinine in November 2011, was 1.0; February 2012, 1.2; on admission, was 2.3, and has increased daily up to today of 5.1.  In the computer, it is only recorded that she is making 100-300 mL of urine per day, however, she says she has been making more than than.  UA sent to the lab initially was "red" with large amount of blood, although she had only 0-2 rbc's, although she did have 11-20 wbc's.  CPK was found to be high at 15,000.  PAST MEDICAL HISTORY: 1. Significant for atrial fib, on Coumadin. 2. Hypertension. 3. History of incarcerated ventral hernia status post repair. 4. Status post hysterectomy and oophorectomy. 5. Status post arthroscopic left knee surgery. 6. History of asthma.  ALLERGIES:  None.  MEDICATIONS:  Currently include: 1. Avelox 500 mg a day. 2. Protonix 40 mg  b.i.d. 3. Coumadin.  SOCIAL HISTORY:  She is nonsmoker and nondrinker.  She lives in [place] by herself.  She continues to work at [employer].  FAMILY HISTORY:  Mother had hypertension and died of complications from multiple strokes.  Father has hypertension, diabetes, and 98.  No family history of renal disease.  REVIEW OF SYSTEMS:  Appetite is good.  She does have problems with shortness of breath, and has recently been having dyspnea on exertion at home, which she relates to her asthma.  She denies any chest pains.  Her diarrhea has completely resolved.  She denies any new arthritic complaints.  No new neuropathic symptoms.  No new skin rashes.  No dysuria.  She denies any myalgias.  PHYSICAL EXAMINATION:  VITAL SIGNS:  Blood pressure 126/72, pulse 64, temperature 98.4. GENERAL:  The patient is a 77 year old white female who is in no acute distress. HEENT:  Sclerae nonicteric.  Extraocular muscles are intact. NECK:  No JVD.  No lymphadenopathy. LUNGS:  Decreased breath sounds throughout with scattered wheezes bilaterally. HEART:  Irregularly irregular without murmur, rub, or gallop. ABDOMEN:  Positive bowel sounds, nontender, nondistended.  No hepatosplenomegaly. EXTREMITIES:  1  to 2+ edema. SKIN:  No rashes. NEUROLOGIC:  Cranial nerves were intact.  Motor and sensory intact.  No asterixis.  LABORATORY:  Sodium 139, potassium 4.5, bicarb 13, BUN 94, creatinine 5.1, albumin 2.5, calcium 8.1.  Hemoglobin 10.8, white count 8.9, platelet count 162,000, CPK 15,000.  IMPRESSION: 1. Acute renal failure in the setting of volume depletion related to     diarrhea, right upper lobe pneumonia, and possible rhabdomyolysis. 2. Metabolic acidosis. 3. Atrial fibrillation. 4. History of hypertension.  PLAN: 1. We will change her IV fluids to isotonic bicarb and run that at 100     mL/hour and we will try to reinforce a diuresis by also starting     p.o. Lasix 80 mg b.i.d. 2. We  will recheck urinalysis, place a Foley catheter.  Check a serum-     protein electrophoresis, check daily serum creatinine, and we will     recheck a CPK tomorrow.          ______________________________ Dyke Maes, M.D.     MTM/MEDQ  D:  12/15/2010  T:  12/16/2010  Job:  [id number]  Electronically Signed by Primitivo Gauze M.D. on 12/28/2010 06:00:29 PM

## 2010-12-29 ENCOUNTER — Encounter: Payer: Self-pay | Admitting: Family Medicine

## 2010-12-29 ENCOUNTER — Ambulatory Visit (INDEPENDENT_AMBULATORY_CARE_PROVIDER_SITE_OTHER): Payer: Medicare PPO | Admitting: Family Medicine

## 2010-12-29 DIAGNOSIS — D649 Anemia, unspecified: Secondary | ICD-10-CM

## 2010-12-29 DIAGNOSIS — I4891 Unspecified atrial fibrillation: Secondary | ICD-10-CM

## 2010-12-29 DIAGNOSIS — R7989 Other specified abnormal findings of blood chemistry: Secondary | ICD-10-CM

## 2010-12-29 DIAGNOSIS — R945 Abnormal results of liver function studies: Secondary | ICD-10-CM | POA: Insufficient documentation

## 2010-12-29 DIAGNOSIS — Z7901 Long term (current) use of anticoagulants: Secondary | ICD-10-CM | POA: Insufficient documentation

## 2010-12-29 DIAGNOSIS — N179 Acute kidney failure, unspecified: Secondary | ICD-10-CM

## 2010-12-29 LAB — HEPATIC FUNCTION PANEL
ALT: 35 U/L (ref 0–35)
AST: 27 U/L (ref 0–37)
Albumin: 3.4 g/dL — ABNORMAL LOW (ref 3.5–5.2)
Alkaline Phosphatase: 64 U/L (ref 39–117)
Indirect Bilirubin: 0.3 mg/dL (ref 0.0–0.9)
Total Protein: 6.5 g/dL (ref 6.0–8.3)

## 2010-12-29 LAB — BASIC METABOLIC PANEL
CO2: 29 mEq/L (ref 19–32)
Calcium: 8.4 mg/dL (ref 8.4–10.5)
Chloride: 110 mEq/L (ref 96–112)
Glucose, Bld: 118 mg/dL — ABNORMAL HIGH (ref 70–99)
Sodium: 145 mEq/L (ref 135–145)

## 2010-12-29 LAB — CBC WITH DIFFERENTIAL/PLATELET
Basophils Absolute: 0 10*3/uL (ref 0.0–0.1)
Basophils Relative: 1 % (ref 0–1)
Eosinophils Absolute: 0.1 10*3/uL (ref 0.0–0.7)
MCH: 28.2 pg (ref 26.0–34.0)
MCHC: 31.9 g/dL (ref 30.0–36.0)
Neutro Abs: 2.5 10*3/uL (ref 1.7–7.7)
Neutrophils Relative %: 64 % (ref 43–77)
Platelets: 211 10*3/uL (ref 150–400)

## 2010-12-29 MED ORDER — PANTOPRAZOLE SODIUM 40 MG PO TBEC: DELAYED_RELEASE_TABLET | ORAL | Status: DC

## 2010-12-29 NOTE — Progress Notes (Signed)
  Subjective:    Patient ID: [name], female    DOB: 03-31-1929, 77 y.o.   MRN: [id number]  HPI Hospital f/u- was admitted to the hospital w/ fever, diarrhea, dehydration.  Was found to have UTI, elevated LFTs, PNA, renal failure.  Was hospitalized for 2 weeks b/c of renal failure- there was some talk of temporary HD.  Did not mention anything about f/u w/ renal.  Saw Dr Ladona Ridgel in the hospital and was told that heart was 'fine'.  Reports she's feeling 'fine but a little wobbly'.  Wants to go back to work.  Has resumed her previous coumadin regimen of 9 mg daily except 6 mg Wed, Sat, Sun.  HCTZ was held on hospital DC and she wants to know if i agree w/ this.   Review of Systems For ROS see HPI     Objective:   Physical Exam  Constitutional: She is oriented to person, place, and time. She appears well-developed and well-nourished. No distress.  HENT:  Head: Normocephalic and atraumatic.  Eyes: Conjunctivae and EOM are normal. Pupils are equal, round, and reactive to light.  Neck: Normal range of motion. Neck supple. No thyromegaly present.  Cardiovascular: Normal rate, normal heart sounds and intact distal pulses.        Irregular S1/S2  Pulmonary/Chest: Effort normal and breath sounds normal. No respiratory distress. She has no wheezes. She has no rales.  Abdominal: Soft. She exhibits no distension. There is no tenderness.  Musculoskeletal: She exhibits no edema.  Lymphadenopathy:    She has no cervical adenopathy.  Neurological: She is alert and oriented to person, place, and time.  Skin: Skin is warm and dry.  Psychiatric: She has a normal mood and affect. Her behavior is normal.          Assessment & Plan:

## 2010-12-29 NOTE — Patient Instructions (Signed)
Schedule an appt in 1 month to recheck blood pressure and pulse We'll notify you of your lab results and determine the next steps Please rest and allow yourself time to recover- take this slow! Your med list is now correct Call with any questions or concerns Hang in there!!!

## 2010-12-30 ENCOUNTER — Telehealth: Payer: Self-pay | Admitting: *Deleted

## 2010-12-30 NOTE — Telephone Encounter (Signed)
Spoke with call a nurse - lab called with critical INR and they were unable to contact patient.  I spoke with patient and on call MD, Dr Ermalene Searing. Pt has no signs/symptoms of bleeding, she had hospital f/u w/PCP on Friday and feels about the same. Currently taking Coumadin 6 mg on W,Sat,Sun AND 9 mg on all other days. Per Dr Ermalene Searing, hold coumadin today, restart tomorrow (sunday) and have INR checked Monday. Patient informed and will call office Monday am for nurse vist PT/INR check.

## 2010-12-30 NOTE — Telephone Encounter (Signed)
Agreed -

## 2011-01-01 NOTE — Telephone Encounter (Signed)
Pt states that she is unable to come in to office today due to no transportation. Pt is able to come in on tomorrow but needs to know what to do for today coumadin dose.Marland KitchenMarland KitchenPlease advise

## 2011-01-01 NOTE — Telephone Encounter (Signed)
Discuss with patient, Appt scheduled. 

## 2011-01-01 NOTE — Telephone Encounter (Signed)
Should take 6 mg today and we'll check it tomorrow.

## 2011-01-02 ENCOUNTER — Ambulatory Visit (INDEPENDENT_AMBULATORY_CARE_PROVIDER_SITE_OTHER): Payer: Medicare PPO | Admitting: *Deleted

## 2011-01-02 ENCOUNTER — Encounter: Payer: Self-pay | Admitting: *Deleted

## 2011-01-02 DIAGNOSIS — I4891 Unspecified atrial fibrillation: Secondary | ICD-10-CM

## 2011-01-02 DIAGNOSIS — Z7901 Long term (current) use of anticoagulants: Secondary | ICD-10-CM

## 2011-01-02 LAB — POCT INR: INR: 3.9

## 2011-01-02 NOTE — Assessment & Plan Note (Signed)
Pt's Cr was nearly 6 while hospitalized and there was talk of temporary HD.  No f/u planned w/ renal.  HCTZ held to avoid further renal damage.  Check labs and ensure Cr is continuing to trend down.

## 2011-01-02 NOTE — Patient Instructions (Addendum)
Return to office in 1 week  New dosing: Hold today then take 2 tab daily

## 2011-01-02 NOTE — Assessment & Plan Note (Signed)
Check PT/INR and adjust meds prn.

## 2011-01-02 NOTE — Assessment & Plan Note (Signed)
Discovered during recent hospitalization.  Repeat CBC to assess.

## 2011-01-02 NOTE — Assessment & Plan Note (Signed)
Stable.  Was seen by cards in the hospital.

## 2011-01-02 NOTE — Assessment & Plan Note (Signed)
Recheck labs to ensure they continue to decrease

## 2011-01-09 ENCOUNTER — Ambulatory Visit: Payer: Medicare PPO

## 2011-01-11 ENCOUNTER — Ambulatory Visit (INDEPENDENT_AMBULATORY_CARE_PROVIDER_SITE_OTHER): Payer: Medicare PPO | Admitting: Family Medicine

## 2011-01-11 DIAGNOSIS — Z7901 Long term (current) use of anticoagulants: Secondary | ICD-10-CM

## 2011-01-11 DIAGNOSIS — I4891 Unspecified atrial fibrillation: Secondary | ICD-10-CM

## 2011-01-11 NOTE — Patient Instructions (Addendum)
Per Dr Laury Axon:  Take 1 tablet tonight and 1 tablet Friday. Then resume 2 tablets Saturday through Thursday and 1 tablet on Fridays. Recheck INR in 1 week.

## 2011-01-16 ENCOUNTER — Other Ambulatory Visit: Payer: Self-pay | Admitting: Internal Medicine

## 2011-01-16 NOTE — Telephone Encounter (Signed)
[facility] monitors pt's Coumadin will forward to them to refill.

## 2011-01-16 NOTE — Telephone Encounter (Signed)
rx sent to pharmacy by e-script Per pt has been coming in for normal PT/INR checks

## 2011-01-17 ENCOUNTER — Ambulatory Visit (INDEPENDENT_AMBULATORY_CARE_PROVIDER_SITE_OTHER): Payer: Medicare PPO | Admitting: *Deleted

## 2011-01-17 DIAGNOSIS — I4891 Unspecified atrial fibrillation: Secondary | ICD-10-CM

## 2011-01-17 DIAGNOSIS — Z7901 Long term (current) use of anticoagulants: Secondary | ICD-10-CM

## 2011-01-17 LAB — POCT INR: INR: 3.5

## 2011-01-17 NOTE — Patient Instructions (Signed)
Return to office in 1 week  New dosing: 2 tab daily except 1 tab M,W,F

## 2011-01-24 NOTE — Consult Note (Signed)
[name], [name]                 ACCOUNT NO.:  [id number]  MEDICAL RECORD NO.:  [id number]  LOCATION:  4734                         FACILITY:  [facility]  PHYSICIAN:  Doylene Canning. Ladona Ridgel, MD    DATE OF BIRTH:  12/05/1929  DATE OF CONSULTATION:  12/21/2010 DATE OF DISCHARGE:                                CONSULTATION   PRIMARY MD:  Kathryn Rhymes, MD  PRIMARY CARDIOLOGIST:  Doylene Canning. Ladona Ridgel, MD  CHIEF COMPLAINT ON ADMISSION:  Diarrhea and fevers.  REASON FOR CONSULTATION:  Bradycardia.  HISTORY OF PRESENT ILLNESS:  [name] is a pleasant 77 year old female with a history of newly diagnosed paroxysmal atrial fibrillation with poor ventricular response and asymptomatic bradycardia in May of this year, hypertension, and hyperlipidemia who was admitted on October 15 for fever, diarrhea, and community-acquired pneumonia and is needing consult for bradycardia.  Overnight it was noted her heart rate to be in the 30s while sleeping.  The patient denies dizziness, chest pain,  shortness of breath or any other symptoms related to her bradycardia. It was noted in May when she wore a Holter monitor that she did have asymptomatic bradycardia with a heart rate as low as 28 in the middle of the night.  She was subsequently diagnosed with atrial fibrillation with slow ventricular rate and started on Coumadin at that time.  She has not had any prior echocardiograms, cardiac catheterizations, or stress tests.  PAST MEDICAL HISTORY: 1. Paroxysmal atrial fibrillation with slow ventricular response,     asymptomatic bradycardia, nonsustainted V-tach diagnosed on Holter     monitor in May 2012.     a.     Was started on Coumadin at this time. 2. Hypertension. 3. Hyperlipidemia. 4. Left knee medial meniscal tear status post repair in December 2003. 5. Hysterectomy and oophorectomy. 6. Incarcerated ventral incisional hernia status post laparoscopic     repair in January 2005.  SOCIAL HISTORY:  The  patient lives in [place] by herself.  She is still employed with [employer].  She has 3 children.  She has never been a smoker and denies any alcohol or illicit drug use.  She does state that she walks every day.  FAMILY HISTORY:  Her mother had a history of hypertension and had 5 CVAs.  Her father had hypertension.  IN PATIENT MEDICATIONS: 1. Protonix 40 mg b.i.d. 2. Warfarin per pharmacy. 3. Albuterol q.6 hours p.r.n. 4. D5W at 75 mL per hour.  ALLERGIES:  No known drug allergies.  REVIEW OF SYSTEMS:  Preadmission, she had the following complaints: Fevers, chills, diarrhea, dark stools.  All of these symptoms have resolved during this hospitalization.  Other review of systems as per HPI.  Otherwise all other systems were reviewed and negative.  LABORATORY DATA:  White blood cell count 8.6, hemoglobin 9.1, hematocrit 27.7, platelets 187,000.  Sodium 142, potassium 3.3, chloride 96, CO2 35, BUN 83, creatinine 4.0, glucose 136, calcium 6.8, magnesium 1.5, phosphorus 6.3, PTT 46, PT 26.7, INR 2.42.  On admission on October 15, point-of-care troponin I was 0.31.  Cardiac enzymes:  CK 14,840, CK-MB 99.8, troponin I 0.32.  Cardiac enzymes:  CK 15646, CK-MB 92.5,  troponin I 0.30.  Cardiac enzymes:  CK 15,586, CK-MB 181.2.  Troponin I 0.30.  CK on October 25 trended down to 360.  Chest x-ray on December 11, 2010 showed rounded consolidation seen in the right upper lobe compatible with pneumonia.  Left lung clear.  Heart is borderline in size.   EKG on October 16 showed sinus rhythm with PACs and PVCs, rate 70.  On tele, it appears to be sinus rhythm with frequent ectopic multifocal atrial beats, heart rate lowest 38.  PHYSICAL EXAMINATION:  VITAL SIGNS:  Temperature 97.2, pulse 78, respirations 20, BP 155/70, SaO2 95% on room air. GENERAL:  Pleasant overweight female in no acute distress. HEENT:  Normocephalic, atraumatic, extraocular movements intact, sclerae clear, nares without  discharge. NECK:  Supple.  No bruits, minimal JVD. CARDIOVASCULAR:  Regular rate and rhythm with occasional irregularities, normal S1, S2.  No murmurs, rubs, or gallops. LUNGS:  Clear to auscultation bilaterally without wheezes, rales, or rhonchi, nonlabored. ABDOMEN:  Soft, nontender, normoactive bowel sounds.  No rebound or guarding. EXTREMITIES:  Trace to 1+ edema in the left lower extremity.  No cyanosis, clubbing, or other edema. NEURO:  Alert and oriented x3 with normal affect.  ASSESSMENT/PLAN: 1. This is a 77 year old female who was admitted for fever and     diarrhea and found to have pneumonia and renal failure.  We     were asked to see her regarding her bradycardia.  The patient is     asymptomatic and hemodynamically stable without any hypotension.     EKG showed sinus rhythm with frequent ectopic beats lowest in the     30s in the early a.m.  There is no indication for pacemaker at this     time as she is asymptomatic.  We will continue to follow her and     get an echocardiogram in the morning.  She can be off telemetry     during this time, so she can go down to get the echocardiogram for a     better view.2. History of atrial fibrillation on Holter monitor.  We will continue     Coumadin. 3. Elevated CK, question being secondary to her statin.  Continue to     hold and follow. 4. Hypertension, follow.  She is being diuresed now. 5. Renal.  Renal Service following.    ______________________________ Berton Mount, PA-C   ______________________________ Doylene Canning. Ladona Ridgel, MD    JH/MEDQ  D:  12/21/2010  T:  12/22/2010  Job:  [id number]  Electronically Signed by Berton Mount PA on 01/01/2011 01:35:32 PM Electronically Signed by Lewayne Bunting MD on 01/24/2011 01:24:38 PM

## 2011-01-25 ENCOUNTER — Encounter: Payer: Self-pay | Admitting: Family Medicine

## 2011-01-25 ENCOUNTER — Ambulatory Visit: Payer: Medicare PPO

## 2011-01-25 ENCOUNTER — Ambulatory Visit (INDEPENDENT_AMBULATORY_CARE_PROVIDER_SITE_OTHER): Payer: Medicare PPO | Admitting: Family Medicine

## 2011-01-25 DIAGNOSIS — D649 Anemia, unspecified: Secondary | ICD-10-CM

## 2011-01-25 DIAGNOSIS — I4891 Unspecified atrial fibrillation: Secondary | ICD-10-CM

## 2011-01-25 DIAGNOSIS — I10 Essential (primary) hypertension: Secondary | ICD-10-CM

## 2011-01-25 DIAGNOSIS — N179 Acute kidney failure, unspecified: Secondary | ICD-10-CM

## 2011-01-25 DIAGNOSIS — Z7901 Long term (current) use of anticoagulants: Secondary | ICD-10-CM

## 2011-01-25 LAB — CBC WITH DIFFERENTIAL/PLATELET
Basophils Absolute: 0 10*3/uL (ref 0.0–0.1)
Basophils Relative: 0.5 % (ref 0.0–3.0)
Hemoglobin: 11.5 g/dL — ABNORMAL LOW (ref 12.0–15.0)
Lymphocytes Relative: 29.6 % (ref 12.0–46.0)
Monocytes Relative: 8.1 % (ref 3.0–12.0)
Neutro Abs: 2.3 10*3/uL (ref 1.4–7.7)
Neutrophils Relative %: 59.3 % (ref 43.0–77.0)
RBC: 3.84 Mil/uL — ABNORMAL LOW (ref 3.87–5.11)
RDW: 16.1 % — ABNORMAL HIGH (ref 11.5–14.6)

## 2011-01-25 LAB — BASIC METABOLIC PANEL
CO2: 28 mEq/L (ref 19–32)
Chloride: 110 mEq/L (ref 96–112)
Creatinine, Ser: 1.3 mg/dL — ABNORMAL HIGH (ref 0.4–1.2)
Potassium: 3.8 mEq/L (ref 3.5–5.1)

## 2011-01-25 NOTE — Progress Notes (Signed)
  Subjective:    Patient ID: [name], female    DOB: November 01, 1929, 77 y.o.   MRN: [id number]  HPI HTN- chronic problem.  Well controlled today.  HCTZ was stopped at hospital D/C.  Not currently on any meds.  Denies CP, SOB, edema, HA, visual changes.  Afib- due for PT check today.  Taking coumadin M/W/F 2 tabs, all other days 1 tab.  Denies palpitations.  Anemia- hgb at last visit was 9.6.  Will need repeat labs.  Denies fatigue.  ARF- dx'd while hospitalized.  Cr was trending down at last visit.  Due for repeat labs.   Review of Systems For ROS see HPI     Objective:   Physical Exam  Vitals reviewed. Constitutional: She is oriented to person, place, and time. She appears well-developed and well-nourished. No distress.  HENT:  Head: Normocephalic and atraumatic.  Eyes: Conjunctivae and EOM are normal. Pupils are equal, round, and reactive to light.  Neck: Normal range of motion. Neck supple. No thyromegaly present.  Cardiovascular: Normal rate, normal heart sounds and intact distal pulses.   No murmur heard. Pulmonary/Chest: Effort normal and breath sounds normal. No respiratory distress.  Abdominal: Soft. She exhibits no distension. There is no tenderness.  Musculoskeletal: She exhibits no edema.  Lymphadenopathy:    She has no cervical adenopathy.  Neurological: She is alert and oriented to person, place, and time.  Skin: Skin is warm and dry.  Psychiatric: She has a normal mood and affect. Her behavior is normal.          Assessment & Plan:

## 2011-01-25 NOTE — Patient Instructions (Signed)
Follow up in 1 month to recheck PT and diabetes We'll notify you of your lab results Continue to take 2 tabs of coumadin on M/W/F and 1 tab all other days You look good- keep it up! We'll complete your paperwork and you can pick it up Call with any questions or concerns Hang in there! Happy Holidays!

## 2011-01-26 ENCOUNTER — Telehealth: Payer: Self-pay | Admitting: *Deleted

## 2011-01-26 NOTE — Telephone Encounter (Signed)
.  left message to have patient return my call. Per paperwork has been filled out by MD tabori and other part has to be filled out by hospital

## 2011-01-31 ENCOUNTER — Encounter: Payer: Self-pay | Admitting: *Deleted

## 2011-02-04 NOTE — Assessment & Plan Note (Signed)
Chronic problem.  Currently rate controlled.  On coumadin.

## 2011-02-04 NOTE — Assessment & Plan Note (Signed)
Likely due to dehydration from acute illness.  Levels have been trending down.  Recheck labs today.

## 2011-02-04 NOTE — Assessment & Plan Note (Signed)
Chronic problem.  meds were stopped while hospitalized.  Not currently on anything.  BP well controlled today.  Will continue to monitor.  Pt expressed understanding and is in agreement w/ plan.

## 2011-02-04 NOTE — Assessment & Plan Note (Signed)
Pt due for PT check.  Adjusted dosing regimen as per instructions below.

## 2011-02-04 NOTE — Assessment & Plan Note (Signed)
9.6 at last visit.  Due for repeat labs today.  Pt currently asymptomatic.

## 2011-02-21 ENCOUNTER — Encounter: Payer: Self-pay | Admitting: Family Medicine

## 2011-02-21 ENCOUNTER — Ambulatory Visit (INDEPENDENT_AMBULATORY_CARE_PROVIDER_SITE_OTHER): Payer: Medicare PPO | Admitting: Family Medicine

## 2011-02-21 DIAGNOSIS — I4891 Unspecified atrial fibrillation: Secondary | ICD-10-CM

## 2011-02-21 DIAGNOSIS — E785 Hyperlipidemia, unspecified: Secondary | ICD-10-CM

## 2011-02-21 DIAGNOSIS — I10 Essential (primary) hypertension: Secondary | ICD-10-CM

## 2011-02-21 DIAGNOSIS — E119 Type 2 diabetes mellitus without complications: Secondary | ICD-10-CM

## 2011-02-21 LAB — BASIC METABOLIC PANEL
CO2: 28 mEq/L (ref 19–32)
Calcium: 9.1 mg/dL (ref 8.4–10.5)
Chloride: 111 mEq/L (ref 96–112)
Glucose, Bld: 122 mg/dL — ABNORMAL HIGH (ref 70–99)
Sodium: 147 mEq/L — ABNORMAL HIGH (ref 135–145)

## 2011-02-21 LAB — HEPATIC FUNCTION PANEL
ALT: 18 U/L (ref 0–35)
AST: 23 U/L (ref 0–37)
Alkaline Phosphatase: 63 U/L (ref 39–117)
Bilirubin, Direct: 0 mg/dL (ref 0.0–0.3)
Total Bilirubin: 0.4 mg/dL (ref 0.3–1.2)
Total Protein: 7.1 g/dL (ref 6.0–8.3)

## 2011-02-21 LAB — LIPID PANEL
LDL Cholesterol: 103 mg/dL — ABNORMAL HIGH (ref 0–99)
Total CHOL/HDL Ratio: 4
Triglycerides: 171 mg/dL — ABNORMAL HIGH (ref 0.0–149.0)

## 2011-02-21 LAB — POCT INR: INR: 3.6

## 2011-02-21 NOTE — Assessment & Plan Note (Signed)
Ongoing issue.  Has never required meds.  Check labs.  Start meds prn

## 2011-02-21 NOTE — Assessment & Plan Note (Signed)
Chronic problem.  On statin w/out difficulty.  Check labs.  Adjust meds prn  

## 2011-02-21 NOTE — Patient Instructions (Signed)
Schedule your complete physical for Feb We'll notify you of your lab results and make any changes if needed Hold the Coumadin today.  Restart 1 tab daily except 2 tabs on Monday and Friday Call with any questions or concerns Keep up the good work! Happy New Year!!!

## 2011-02-21 NOTE — Assessment & Plan Note (Signed)
Pt remains rate controlled, on coumadin.  INR is elevated today.  Hold meds and adjust dose per pt instructions.  Pt expressed understanding and is in agreement w/ plan.

## 2011-02-21 NOTE — Assessment & Plan Note (Signed)
Chronic problem.  Currently off all meds.  BP slightly elevated today but not at level to require starting meds.  Will continue to follow closely.

## 2011-02-21 NOTE — Progress Notes (Signed)
  Subjective:    Patient ID: [name], female    DOB: 1929-08-13, 76 y.o.   MRN: [id number]  HPI DM- chronic problem.  Has been well controlled w/ diet.  Has never deen on meds.  Due for labs.  Denies dizziness, shaky feeling.  HTN- chronic problem.  Stopped HCTZ during hospitalization last month.  BP slightly higher today.  Denies CP, SOB, HAs, visual changes, edema.  Afib- due for PT check.  Remains rate controlled.  INR 3.6 today- admits to dietary changes w/ holidays.   Review of Systems For ROS see HPI     Objective:   Physical Exam  Constitutional: She is oriented to person, place, and time. She appears well-developed and well-nourished. No distress.  HENT:  Head: Normocephalic and atraumatic.  Eyes: Conjunctivae and EOM are normal. Pupils are equal, round, and reactive to light.  Neck: Normal range of motion. Neck supple. No thyromegaly present.  Cardiovascular: Normal rate, normal heart sounds and intact distal pulses.   No murmur heard.      Irregular S1/S2  Pulmonary/Chest: Effort normal and breath sounds normal. No respiratory distress.  Abdominal: Soft. She exhibits no distension. There is no tenderness.  Musculoskeletal: She exhibits no edema.  Lymphadenopathy:    She has no cervical adenopathy.  Neurological: She is alert and oriented to person, place, and time.  Skin: Skin is warm and dry.  Psychiatric: She has a normal mood and affect. Her behavior is normal.          Assessment & Plan:

## 2011-02-26 ENCOUNTER — Other Ambulatory Visit: Payer: Self-pay | Admitting: Family Medicine

## 2011-02-26 DIAGNOSIS — N289 Disorder of kidney and ureter, unspecified: Secondary | ICD-10-CM

## 2011-02-28 ENCOUNTER — Other Ambulatory Visit (INDEPENDENT_AMBULATORY_CARE_PROVIDER_SITE_OTHER): Payer: Medicare PPO

## 2011-02-28 ENCOUNTER — Other Ambulatory Visit: Payer: Self-pay | Admitting: Family Medicine

## 2011-02-28 DIAGNOSIS — N289 Disorder of kidney and ureter, unspecified: Secondary | ICD-10-CM

## 2011-02-28 DIAGNOSIS — I4891 Unspecified atrial fibrillation: Secondary | ICD-10-CM

## 2011-02-28 LAB — BASIC METABOLIC PANEL
Chloride: 110 mEq/L (ref 96–112)
GFR: 47.8 mL/min — ABNORMAL LOW (ref >60.00)
Potassium: 4.4 mEq/L (ref 3.5–5.1)
Sodium: 145 mEq/L (ref 135–145)

## 2011-02-28 MED ORDER — WARFARIN SODIUM 3 MG PO TABS: 3.0000 mg | ORAL_TABLET | ORAL | Status: DC

## 2011-02-28 NOTE — Telephone Encounter (Signed)
rx sent to pharmacy by e-script  

## 2011-03-08 ENCOUNTER — Ambulatory Visit (INDEPENDENT_AMBULATORY_CARE_PROVIDER_SITE_OTHER): Payer: Medicare PPO | Admitting: Family Medicine

## 2011-03-08 ENCOUNTER — Encounter: Payer: Self-pay | Admitting: *Deleted

## 2011-03-08 ENCOUNTER — Encounter: Payer: Self-pay | Admitting: Family Medicine

## 2011-03-08 ENCOUNTER — Ambulatory Visit (INDEPENDENT_AMBULATORY_CARE_PROVIDER_SITE_OTHER): Payer: Medicare PPO | Admitting: *Deleted

## 2011-03-08 VITALS — BP 128/68 | HR 68 | Temp 100.0°F | Resp 14 | Wt 187.4 lb

## 2011-03-08 VITALS — BP 120/87 | HR 67 | Temp 100.1°F | Ht 60.0 in | Wt 189.0 lb

## 2011-03-08 DIAGNOSIS — I4891 Unspecified atrial fibrillation: Secondary | ICD-10-CM

## 2011-03-08 DIAGNOSIS — Z7901 Long term (current) use of anticoagulants: Secondary | ICD-10-CM

## 2011-03-08 DIAGNOSIS — J40 Bronchitis, not specified as acute or chronic: Secondary | ICD-10-CM | POA: Insufficient documentation

## 2011-03-08 LAB — POCT INR: INR: 1.4

## 2011-03-08 MED ORDER — BENZONATATE 200 MG PO CAPS: 200.0000 mg | ORAL_CAPSULE | Freq: Three times a day (TID) | ORAL | Status: AC | PRN

## 2011-03-08 MED ORDER — AZITHROMYCIN 250 MG PO TABS: 250.0000 mg | ORAL_TABLET | Freq: Every day | ORAL | Status: DC

## 2011-03-08 NOTE — Progress Notes (Signed)
  Subjective:    Patient ID: [name], female    DOB: Mar 07, 1929, 78 y.o.   MRN: [id number]  HPI Cough- woke up w/ fever this morning.  sxs started 2 days ago.  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  Cough is dry.  O2 sats were 90% this AM.  Had increased SOB today until inhaler use at 1pm and 3pm- now breathing easier and O2 sats are better.  No body aches.     Review of Systems For ROS see HPI     Objective:   Physical Exam  Vitals reviewed. Constitutional: She appears well-developed and well-nourished. No distress.  HENT:  Head: Normocephalic and atraumatic.       TMs normal bilaterally Mild nasal congestion Throat w/out erythema, edema, or exudate  Eyes: Conjunctivae and EOM are normal. Pupils are equal, round, and reactive to light.  Neck: Normal range of motion. Neck supple.  Cardiovascular: Normal rate, regular rhythm, normal heart sounds and intact distal pulses.   No murmur heard. Pulmonary/Chest: Effort normal and breath sounds normal. No respiratory distress. She has no wheezes.       + hacking cough  Lymphadenopathy:    She has no cervical adenopathy.          Assessment & Plan:

## 2011-03-08 NOTE — Patient Instructions (Addendum)
New dosage instructions to be given at requested OV at 4:15p 01.10.2013 per KT, MD. Pt "feeling bad", fever 100.0, dry cough, Ox at 90-92, sinus drainage clear in color; No body aches and/or chills/sweats//SLS

## 2011-03-08 NOTE — Assessment & Plan Note (Signed)
Pt w/ low grade fever, increased SOB and cough.  Given recent PNA and hospitalization will start abx.  Cough meds prn.  Reviewed sxs of flu- pt to call if she develops these.  Encouraged inhaler use for SOB.  Reviewed supportive care and red flags that should prompt return.  Pt expressed understanding and is in agreement w/ plan.

## 2011-03-08 NOTE — Assessment & Plan Note (Signed)
Pt sub-therapeutic today.  Change coumadin dosing as per pt instructions.

## 2011-03-08 NOTE — Patient Instructions (Signed)
Schedule nurse visit to recheck PT Take 2 coumadin today, 2 tomorrow, and then start 1 tab daily except 2 tabs on Monday, Wednesday, Friday Start the Azithromycin for the bronchitis Tylenol as needed for pain or fever IF you develop body aches- please call me so we can treat the flu! Use the tessalon for cough Use your inhaler as needed for shortness of breath Add Mucinex to thin your congestion Call with any questions or concerns Hang in there!!!

## 2011-03-26 ENCOUNTER — Ambulatory Visit (INDEPENDENT_AMBULATORY_CARE_PROVIDER_SITE_OTHER): Payer: Medicare PPO | Admitting: *Deleted

## 2011-03-26 DIAGNOSIS — I4891 Unspecified atrial fibrillation: Secondary | ICD-10-CM

## 2011-03-26 DIAGNOSIS — Z7901 Long term (current) use of anticoagulants: Secondary | ICD-10-CM

## 2011-03-26 LAB — POCT INR: INR: 1.8

## 2011-03-26 NOTE — Patient Instructions (Signed)
Return to office in 2 weeks  New dosing: 2 tabs daily except 1 tab Sat, Sun

## 2011-04-11 ENCOUNTER — Ambulatory Visit (INDEPENDENT_AMBULATORY_CARE_PROVIDER_SITE_OTHER): Payer: Medicare PPO | Admitting: Family Medicine

## 2011-04-11 ENCOUNTER — Encounter: Payer: Self-pay | Admitting: Family Medicine

## 2011-04-11 VITALS — BP 140/92 | HR 71 | Temp 98.6°F | Ht 59.75 in | Wt 188.0 lb

## 2011-04-11 DIAGNOSIS — Z780 Asymptomatic menopausal state: Secondary | ICD-10-CM

## 2011-04-11 DIAGNOSIS — I10 Essential (primary) hypertension: Secondary | ICD-10-CM

## 2011-04-11 DIAGNOSIS — E785 Hyperlipidemia, unspecified: Secondary | ICD-10-CM

## 2011-04-11 DIAGNOSIS — E119 Type 2 diabetes mellitus without complications: Secondary | ICD-10-CM

## 2011-04-11 DIAGNOSIS — Z1231 Encounter for screening mammogram for malignant neoplasm of breast: Secondary | ICD-10-CM

## 2011-04-11 DIAGNOSIS — Z7901 Long term (current) use of anticoagulants: Secondary | ICD-10-CM

## 2011-04-11 DIAGNOSIS — I4891 Unspecified atrial fibrillation: Secondary | ICD-10-CM

## 2011-04-11 LAB — POCT INR: INR: 2.4

## 2011-04-11 NOTE — Patient Instructions (Signed)
Schedule a nurse visit in 1 month to recheck the coumadin Follow up with me the beginning of April to recheck the diabetes Continue the coumadin like you have been We'll call you with your mammogram and bone density appts You look great!  Keep up the good work! Call with any questions or concerns Happy Valentine's Day and early birthday!

## 2011-04-11 NOTE — Progress Notes (Signed)
  Subjective:    Patient ID: [name], female    DOB: 14-Nov-1929, 78 y.o.   MRN: [id number]  HPI Here today for CPE.  Risk Factors: DM- ongoing problem, controlling w/ diet and exercise. UTD on eye exam. HTN- chronic problem, typically well controlled.  Denies CP, SOB, HA, visual changes, edema. Hyperlipidemia- chronic problem, on Zocor.  No abd pain, N/V, myalgias. Coumadin- life long for afib.  INR today 2.4.  Denies bleeding or bruising. Physical Activity: not exercising, has stationary bike at home but not riding recently Fall Risk: uses cane to ambulate, moderate risk Depression: deneis current sxs Hearing: normal to conversational tones, mildly decreased to whispered voice ADL's: independent Cognitive: normal linear thought process, memory and attention intact Home Safety: feels safe at home Height, Weight, BMI, Visual Acuity: see vitals, vision corrected to 20/20 w/ glasses Counseling: not interested in colonoscopy.  Overdue on mammo and DEXA. Labs Ordered: See A&P Care Plan: See A&P    Review of Systems Patient reports no vision/ hearing changes, adenopathy,fever, weight change,  persistant/recurrent hoarseness , swallowing issues, chest pain, palpitations, edema, persistant/recurrent cough, hemoptysis, dyspnea (rest/exertional/paroxysmal nocturnal), gastrointestinal bleeding (melena, rectal bleeding), abdominal pain, significant heartburn, bowel changes, GU symptoms (dysuria, hematuria, incontinence), Gyn symptoms (abnormal  bleeding, pain),  syncope, focal weakness, memory loss, numbness & tingling, skin/hair/nail changes, abnormal bruising or bleeding, anxiety, or depression.     Objective:   Physical Exam General Appearance:    Alert, cooperative, no distress, appears stated age  Head:    Normocephalic, without obvious abnormality, atraumatic  Eyes:    PERRL, conjunctiva/corneas clear, EOM's intact, fundi    benign, both eyes  Ears:    Normal TM's and external ear  canals, both ears  Nose:   Nares normal, septum midline, mucosa normal, no drainage    or sinus tenderness  Throat:   Lips, mucosa, and tongue normal; teeth and gums normal  Neck:   Supple, symmetrical, trachea midline, no adenopathy;    Thyroid: no enlargement/tenderness/nodules  Back:     Symmetric, no curvature, ROM normal, no CVA tenderness  Lungs:     Clear to auscultation bilaterally, respirations unlabored  Chest Wall:    No tenderness or deformity   Heart:    Irregularly irregular S1 and S2, no murmur, rub   or gallop  Breast Exam:    Deferred at pt's request  Abdomen:     Soft, non-tender, bowel sounds active all four quadrants,    no masses, no organomegaly  Genitalia:    Deferred at pt's request  Rectal:    Extremities:   Extremities normal, atraumatic, no cyanosis or edema  Pulses:   2+ and symmetric all extremities  Skin:   Skin color, texture, turgor normal, no rashes or lesions  Lymph nodes:   Cervical, supraclavicular, and axillary nodes normal  Neurologic:   CNII-XII intact, normal strength, sensation and reflexes    throughout          Assessment & Plan:

## 2011-04-24 NOTE — Assessment & Plan Note (Signed)
Chronic problem.  INR within range today.  No changes.

## 2011-04-24 NOTE — Assessment & Plan Note (Signed)
Chronic problem.  Fairly well controlled off meds.  Asymptomatic.  No changes.

## 2011-04-24 NOTE — Assessment & Plan Note (Signed)
Chronic problem.  Tolerating statin w/out difficulty.  Check labs.  Adjust meds prn  

## 2011-04-24 NOTE — Assessment & Plan Note (Signed)
Chronic problem.  Typically well controlled w/ diet.  UTD on eye exam.  Asymptomatic.  Check labs.  Start meds prn.

## 2011-05-17 ENCOUNTER — Ambulatory Visit (INDEPENDENT_AMBULATORY_CARE_PROVIDER_SITE_OTHER): Payer: Medicare PPO | Admitting: *Deleted

## 2011-05-17 VITALS — BP 140/76 | HR 66

## 2011-05-17 DIAGNOSIS — Z7901 Long term (current) use of anticoagulants: Secondary | ICD-10-CM

## 2011-05-17 DIAGNOSIS — I4891 Unspecified atrial fibrillation: Secondary | ICD-10-CM

## 2011-05-17 LAB — POCT INR: INR: 3

## 2011-05-17 NOTE — Patient Instructions (Signed)
Return to office in 4 weeks  Continue current dose: 2 tabs daily except 1 tab Sat, Sun

## 2011-05-18 ENCOUNTER — Telehealth: Payer: Self-pay | Admitting: Family Medicine

## 2011-05-18 DIAGNOSIS — I4891 Unspecified atrial fibrillation: Secondary | ICD-10-CM

## 2011-05-18 MED ORDER — WARFARIN SODIUM 3 MG PO TABS: 3.0000 mg | ORAL_TABLET | ORAL | Status: DC

## 2011-05-18 NOTE — Telephone Encounter (Signed)
Refill: Coumadin 3mg  tab. #30. Take 1 tablet by mouth every day as directed. Last fill 2.28.13

## 2011-05-18 NOTE — Telephone Encounter (Signed)
rx sent to pharmacy by e-script  

## 2011-06-04 ENCOUNTER — Ambulatory Visit (INDEPENDENT_AMBULATORY_CARE_PROVIDER_SITE_OTHER): Payer: Medicare PPO | Admitting: Family Medicine

## 2011-06-04 ENCOUNTER — Encounter: Payer: Self-pay | Admitting: Family Medicine

## 2011-06-04 VITALS — BP 118/80 | HR 83 | Temp 98.4°F | Ht 59.0 in | Wt 193.2 lb

## 2011-06-04 DIAGNOSIS — I10 Essential (primary) hypertension: Secondary | ICD-10-CM

## 2011-06-04 DIAGNOSIS — E119 Type 2 diabetes mellitus without complications: Secondary | ICD-10-CM

## 2011-06-04 LAB — BASIC METABOLIC PANEL
Calcium: 9.2 mg/dL (ref 8.4–10.5)
GFR: 47.3 mL/min — ABNORMAL LOW (ref >60.00)
Glucose, Bld: 115 mg/dL — ABNORMAL HIGH (ref 70–99)
Potassium: 4.7 mEq/L (ref 3.5–5.1)
Sodium: 142 mEq/L (ref 135–145)

## 2011-06-04 NOTE — Patient Instructions (Signed)
Schedule your complete physical in 3 months- don't eat before this We'll notify you of your lab results Call with any questions or concerns Happy [birth year]ated Birthday!!!

## 2011-06-04 NOTE — Progress Notes (Signed)
  Subjective:    Patient ID: [name], female    DOB: April 23, 1929, 78 y.o.   MRN: [id number]  HPI DM- chronic problem, controlled w/ diet and exercise.  Not currently on meds.  Due for labs.  Denies symptomatic lows.  No CP, SOB, HAs, visual changes, edema.    HTN- chronic problem, excellent control today.  Not currently on meds.   Review of Systems For ROS see HPI     Objective:   Physical Exam  Vitals reviewed. Constitutional: She is oriented to person, place, and time. She appears well-developed and well-nourished. No distress.  HENT:  Head: Normocephalic and atraumatic.  Eyes: Conjunctivae and EOM are normal. Pupils are equal, round, and reactive to light.  Neck: Normal range of motion. Neck supple. No thyromegaly present.  Cardiovascular: Normal rate, regular rhythm, normal heart sounds and intact distal pulses.   No murmur heard. Pulmonary/Chest: Effort normal and breath sounds normal. No respiratory distress.  Abdominal: Soft. She exhibits no distension. There is no tenderness.  Musculoskeletal: She exhibits no edema.  Lymphadenopathy:    She has no cervical adenopathy.  Neurological: She is alert and oriented to person, place, and time.  Skin: Skin is warm and dry.  Psychiatric: She has a normal mood and affect. Her behavior is normal.          Assessment & Plan:

## 2011-06-07 LAB — HEMOGLOBIN A1C: Hgb A1c MFr Bld: 6.4 % (ref 4.6–6.5)

## 2011-06-10 NOTE — Assessment & Plan Note (Signed)
Chronic problem.  Pt's sxs remain well controlled off meds.  Asymptomatic.  No changes at this time.  Will continue to follow closely.

## 2011-06-10 NOTE — Assessment & Plan Note (Signed)
Chronic problem.  Controlling w/ diet.  Has never required meds.  Check labs.  Reviewed low carb diet.  Will continue to follow closely.

## 2011-06-14 ENCOUNTER — Encounter: Payer: Self-pay | Admitting: *Deleted

## 2011-06-14 ENCOUNTER — Ambulatory Visit (INDEPENDENT_AMBULATORY_CARE_PROVIDER_SITE_OTHER): Payer: Medicare PPO | Admitting: *Deleted

## 2011-06-14 DIAGNOSIS — I4891 Unspecified atrial fibrillation: Secondary | ICD-10-CM

## 2011-06-14 DIAGNOSIS — Z7901 Long term (current) use of anticoagulants: Secondary | ICD-10-CM

## 2011-06-14 LAB — POCT INR: INR: 3.3

## 2011-06-14 NOTE — Patient Instructions (Addendum)
Todays PT/INR 3.3 Take ONE tablet today 06-14-11 Then start taking ONE tablet on Sun, Wed, and Saturday, take TWO tablets on Mon, Tues, Thursday, Friday Re-check PT/INR in 2 weeks

## 2011-06-27 ENCOUNTER — Ambulatory Visit (INDEPENDENT_AMBULATORY_CARE_PROVIDER_SITE_OTHER): Payer: Medicare PPO | Admitting: *Deleted

## 2011-06-27 VITALS — BP 122/70 | HR 63 | Temp 98.5°F | Wt 193.0 lb

## 2011-06-27 DIAGNOSIS — I4891 Unspecified atrial fibrillation: Secondary | ICD-10-CM

## 2011-06-27 DIAGNOSIS — Z7901 Long term (current) use of anticoagulants: Secondary | ICD-10-CM

## 2011-06-27 LAB — PROTIME-INR
INR: 1.9 ratio — ABNORMAL HIGH (ref 0.8–1.0)
Prothrombin Time: 21.5 s — ABNORMAL HIGH (ref 10.2–12.4)

## 2011-07-11 ENCOUNTER — Ambulatory Visit (INDEPENDENT_AMBULATORY_CARE_PROVIDER_SITE_OTHER): Payer: Medicare PPO | Admitting: *Deleted

## 2011-07-11 VITALS — BP 150/70 | HR 64 | Temp 98.3°F | Wt 195.0 lb

## 2011-07-11 DIAGNOSIS — Z7901 Long term (current) use of anticoagulants: Secondary | ICD-10-CM

## 2011-07-11 DIAGNOSIS — I4891 Unspecified atrial fibrillation: Secondary | ICD-10-CM

## 2011-07-11 NOTE — Patient Instructions (Signed)
Return to office in 1 week  New dosing: 2 tabs today then 2 tabs daily

## 2011-07-18 ENCOUNTER — Ambulatory Visit (INDEPENDENT_AMBULATORY_CARE_PROVIDER_SITE_OTHER): Payer: Medicare PPO | Admitting: *Deleted

## 2011-07-18 ENCOUNTER — Encounter: Payer: Self-pay | Admitting: *Deleted

## 2011-07-18 ENCOUNTER — Telehealth: Payer: Self-pay | Admitting: *Deleted

## 2011-07-18 DIAGNOSIS — Z7901 Long term (current) use of anticoagulants: Secondary | ICD-10-CM

## 2011-07-18 DIAGNOSIS — Z5181 Encounter for therapeutic drug level monitoring: Secondary | ICD-10-CM

## 2011-07-18 DIAGNOSIS — I4891 Unspecified atrial fibrillation: Secondary | ICD-10-CM

## 2011-07-18 LAB — POCT INR: INR: 2

## 2011-07-18 NOTE — Telephone Encounter (Signed)
Pt informed this nurse that she had her bone density and her mammogram yesterday, noted in the chart per pt has upcoming CPE

## 2011-07-18 NOTE — Patient Instructions (Signed)
Today PT/INR 2.0 Continue same dosage 2 tabs daily (3mg  Tabs) re-check PT/INR in 2 weeks

## 2011-07-30 ENCOUNTER — Ambulatory Visit: Payer: Medicare PPO

## 2011-07-31 ENCOUNTER — Encounter: Payer: Self-pay | Admitting: Family Medicine

## 2011-07-31 ENCOUNTER — Other Ambulatory Visit: Payer: Self-pay | Admitting: Family Medicine

## 2011-07-31 DIAGNOSIS — I4891 Unspecified atrial fibrillation: Secondary | ICD-10-CM

## 2011-07-31 MED ORDER — WARFARIN SODIUM 3 MG PO TABS: 3.0000 mg | ORAL_TABLET | ORAL | Status: DC

## 2011-07-31 NOTE — Telephone Encounter (Signed)
Refill Coumadin 3mg  tab  Take one tablet by mouth every day as directed Qty 30 Last fill 05.16.13 Last ov 4.8.13

## 2011-07-31 NOTE — Telephone Encounter (Signed)
rx sent to pharmacy by e-script  

## 2011-08-01 ENCOUNTER — Telehealth: Payer: Self-pay | Admitting: *Deleted

## 2011-08-01 NOTE — Telephone Encounter (Signed)
Called pt to advise results were received for her bone density noting she has osteopenia and needs to start taking Ca 1200 and 800mg  of Vit D daily which can be accomplished by taking 2 caltrates, left vm for pt to call office. letter mailed to patients home address with results.

## 2011-08-03 ENCOUNTER — Ambulatory Visit (INDEPENDENT_AMBULATORY_CARE_PROVIDER_SITE_OTHER): Payer: Medicare PPO | Admitting: *Deleted

## 2011-08-03 VITALS — BP 160/80 | HR 64 | Temp 98.6°F | Wt 193.0 lb

## 2011-08-03 DIAGNOSIS — I4891 Unspecified atrial fibrillation: Secondary | ICD-10-CM

## 2011-08-03 DIAGNOSIS — Z7901 Long term (current) use of anticoagulants: Secondary | ICD-10-CM

## 2011-08-03 NOTE — Patient Instructions (Signed)
Return to office in 2 weeks  New dosing: 3 tabs today then resume 2 tab daily

## 2011-08-08 ENCOUNTER — Encounter: Payer: Self-pay | Admitting: Family Medicine

## 2011-08-16 ENCOUNTER — Ambulatory Visit (INDEPENDENT_AMBULATORY_CARE_PROVIDER_SITE_OTHER): Payer: Medicare PPO | Admitting: Family Medicine

## 2011-08-16 ENCOUNTER — Encounter: Payer: Self-pay | Admitting: Family Medicine

## 2011-08-16 VITALS — BP 120/84 | HR 97 | Temp 98.5°F | Ht 59.5 in | Wt 191.0 lb

## 2011-08-16 DIAGNOSIS — M949 Disorder of cartilage, unspecified: Secondary | ICD-10-CM

## 2011-08-16 DIAGNOSIS — E119 Type 2 diabetes mellitus without complications: Secondary | ICD-10-CM

## 2011-08-16 DIAGNOSIS — M8580 Other specified disorders of bone density and structure, unspecified site: Secondary | ICD-10-CM

## 2011-08-16 DIAGNOSIS — Z0000 Encounter for general adult medical examination without abnormal findings: Secondary | ICD-10-CM | POA: Insufficient documentation

## 2011-08-16 DIAGNOSIS — I10 Essential (primary) hypertension: Secondary | ICD-10-CM

## 2011-08-16 DIAGNOSIS — M899 Disorder of bone, unspecified: Secondary | ICD-10-CM

## 2011-08-16 DIAGNOSIS — Z7901 Long term (current) use of anticoagulants: Secondary | ICD-10-CM

## 2011-08-16 DIAGNOSIS — E785 Hyperlipidemia, unspecified: Secondary | ICD-10-CM

## 2011-08-16 DIAGNOSIS — I4891 Unspecified atrial fibrillation: Secondary | ICD-10-CM

## 2011-08-16 LAB — CBC WITH DIFFERENTIAL/PLATELET
Basophils Relative: 0.5 % (ref 0.0–3.0)
Eosinophils Relative: 0.6 % (ref 0.0–5.0)
Hemoglobin: 13.3 g/dL (ref 12.0–15.0)
Lymphocytes Relative: 12 % (ref 12.0–46.0)
MCV: 88.1 fl (ref 78.0–100.0)
Neutro Abs: 2.8 10*3/uL (ref 1.4–7.7)
Neutrophils Relative %: 79 % — ABNORMAL HIGH (ref 43.0–77.0)
RBC: 4.52 Mil/uL (ref 3.87–5.11)
WBC: 3.6 10*3/uL — ABNORMAL LOW (ref 4.5–10.5)

## 2011-08-16 LAB — BASIC METABOLIC PANEL
BUN: 33 mg/dL — ABNORMAL HIGH (ref 6–23)
Chloride: 104 mEq/L (ref 96–112)
Glucose, Bld: 98 mg/dL (ref 70–99)
Potassium: 4 mEq/L (ref 3.5–5.1)

## 2011-08-16 LAB — LIPID PANEL
HDL: 47.3 mg/dL (ref >39.00)
LDL Cholesterol: 67 mg/dL (ref 0–99)
Total CHOL/HDL Ratio: 3
Triglycerides: 137 mg/dL (ref 0.0–149.0)
VLDL: 27.4 mg/dL (ref 0.0–40.0)

## 2011-08-16 LAB — HEPATIC FUNCTION PANEL
Alkaline Phosphatase: 70 U/L (ref 39–117)
Bilirubin, Direct: 0.2 mg/dL (ref 0.0–0.3)

## 2011-08-16 LAB — POCT INR: INR: 2.6

## 2011-08-16 NOTE — Assessment & Plan Note (Signed)
Chronic problem.  Well controlled w/out meds.  Asymptomatic.  No changes.

## 2011-08-16 NOTE — Assessment & Plan Note (Signed)
New dx based on recent DEXA.  Discussed w/ pt today.  Will start Ca and Vit D daily.

## 2011-08-16 NOTE — Assessment & Plan Note (Signed)
Chronic problem.  Coumadin w/in range.  No changes.

## 2011-08-16 NOTE — Progress Notes (Signed)
  Subjective:    Patient ID: [name], female    DOB: Jul 09, 1929, 78 y.o.   MRN: [id number]  HPI Here today for CPE.  Risk Factors: DM- chronic problem, controlling w/ diet.  Not currently on meds (has never been on meds).  UTD on eye exam.  Denies symptomatic lows.  No N/V, dizziness, shaking.   HTN- chronic problem, excellent control since stopping meds.  No CP, SOB, HAs, visual changes, edema. Hyperlipidemia- chronic problem, on Zocor.  Denies abd pain, N/V, myalgias Afib- on coumadin, check today.  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  Ambulates w/ cane Depression: no current sxs Hearing: normal to conversational tones, mildly decreased to whispered voice ADL's: independent Cognitive: normal linear thought process, memory and attention intact Home Safety:  Safe at home. Height, Weight, BMI, Visual Acuity: see vitals, vision corrected to 20/20 w/ glasses Counseling:  UTD on mammo and DEXA, no need for paps.  Has never had colonoscopy, not interested Labs Ordered: See A&P Care Plan: See A&P    Review of Systems Patient reports no vision/ hearing changes, adenopathy,fever, weight change,  persistant/recurrent hoarseness , swallowing issues, chest pain, palpitations, edema, persistant/recurrent cough, hemoptysis, dyspnea (rest/exertional/paroxysmal nocturnal), gastrointestinal bleeding (melena, rectal bleeding), abdominal pain, significant heartburn, bowel changes, GU symptoms (dysuria, hematuria, incontinence), Gyn symptoms (abnormal  bleeding, pain),  syncope, focal weakness, memory loss, numbness & tingling, skin/hair/nail changes, abnormal bruising or bleeding, anxiety, or depression.     Objective:   Physical Exam General Appearance:    Alert, cooperative, no distress, appears stated age, overweight  Head:    Normocephalic, without obvious  abnormality, atraumatic  Eyes:    PERRL, conjunctiva/corneas clear, EOM's intact, fundi    benign, both eyes  Ears:    Normal TM's and external ear canals, both ears  Nose:   Nares normal, septum midline, mucosa normal, no drainage    or sinus tenderness  Throat:   Lips, mucosa, and tongue normal; teeth and gums normal  Neck:   Supple, symmetrical, trachea midline, no adenopathy;    Thyroid: no enlargement/tenderness/nodules  Back:     Symmetric, no curvature, ROM normal, no CVA tenderness  Lungs:     Clear to auscultation bilaterally, respirations unlabored  Chest Wall:    No tenderness or deformity   Heart:    Regular rate and rhythm- no Afib noted today, S1 and S2 normal, no murmur, rub   or gallop  Breast Exam:    Deferred  Abdomen:     Soft, non-tender, bowel sounds active all four quadrants,    no masses, no organomegaly  Genitalia:    Deferred  Rectal:    Extremities:   Extremities normal, atraumatic, no cyanosis or edema  Pulses:   2+ and symmetric all extremities  Skin:   Skin color, texture, turgor normal, no rashes or lesions  Lymph nodes:   Cervical, supraclavicular, and axillary nodes normal  Neurologic:   CNII-XII intact, normal strength, sensation and reflexes    throughout          Assessment & Plan:

## 2011-08-16 NOTE — Telephone Encounter (Signed)
Pt was advised about her results during her office visit today. Pt understood

## 2011-08-16 NOTE — Assessment & Plan Note (Signed)
Chronic problem.  Doing well on diet.  Not exercising.  UTD on eye exam.  Asymptomatic.  Check labs, adjust treatment prn.

## 2011-08-16 NOTE — Assessment & Plan Note (Signed)
Pt's PE WNL.  Deferred breast exam due to recent mammo.  Discussed importance of regular exercise.  Check labs.  Pt refusing colonoscopy- also on anticoag that would need to be stopped.  Anticipatory guidance provided.

## 2011-08-16 NOTE — Patient Instructions (Addendum)
Schedule your PT check in 1 month Follow up in 3-4 months to recheck diabetes You look great!  Keep up the good work! Try and get some regular exercise Call Dr Ladona Ridgel and set up an appt [facility] notify you of your lab results Call with any questions or concerns Have a great summer!!!!

## 2011-08-16 NOTE — Assessment & Plan Note (Signed)
Chronic problem.  Tolerating statin w/out difficulty.  Due for labs.  Adjust meds prn. 

## 2011-08-20 ENCOUNTER — Encounter: Payer: Self-pay | Admitting: *Deleted

## 2011-08-21 ENCOUNTER — Encounter: Payer: Self-pay | Admitting: *Deleted

## 2011-08-21 MED ORDER — ERGOCALCIFEROL 1.25 MG (50000 UT) PO CAPS: 50000.0000 [IU] | ORAL_CAPSULE | ORAL | Status: DC

## 2011-08-21 NOTE — Addendum Note (Signed)
Addended by: Derry Lory A on: 08/21/2011 09:12 AM   Modules accepted: Orders

## 2011-09-10 ENCOUNTER — Other Ambulatory Visit: Payer: Self-pay | Admitting: Family Medicine

## 2011-09-10 DIAGNOSIS — E785 Hyperlipidemia, unspecified: Secondary | ICD-10-CM

## 2011-09-10 MED ORDER — SIMVASTATIN 40 MG PO TABS: 40.0000 mg | ORAL_TABLET | Freq: Every day | ORAL | Status: DC

## 2011-09-10 NOTE — Telephone Encounter (Signed)
refill zocor 40mg  tab #30 take one tablet by mouth at bedtime, last fill 5.8.13, last ov 6.20.13

## 2011-09-10 NOTE — Telephone Encounter (Signed)
rx sent to pharmacy by e-script  

## 2011-09-14 ENCOUNTER — Ambulatory Visit (INDEPENDENT_AMBULATORY_CARE_PROVIDER_SITE_OTHER): Payer: Medicare PPO | Admitting: *Deleted

## 2011-09-14 VITALS — BP 160/62 | HR 53 | Wt 193.0 lb

## 2011-09-14 DIAGNOSIS — Z7901 Long term (current) use of anticoagulants: Secondary | ICD-10-CM

## 2011-09-14 DIAGNOSIS — I4891 Unspecified atrial fibrillation: Secondary | ICD-10-CM

## 2011-09-14 NOTE — Patient Instructions (Signed)
Return to office in 4 weeks   Continue current dose: 2 tabs daily  

## 2011-09-19 ENCOUNTER — Other Ambulatory Visit: Payer: Self-pay | Admitting: Family Medicine

## 2011-09-19 DIAGNOSIS — I4891 Unspecified atrial fibrillation: Secondary | ICD-10-CM

## 2011-09-19 MED ORDER — WARFARIN SODIUM 3 MG PO TABS: 3.0000 mg | ORAL_TABLET | ORAL | Status: DC

## 2011-09-19 NOTE — Telephone Encounter (Signed)
COUMADIN 3 MGTAB QTY:30 LAST REFILL: 09/18/11 TAKE ONE TABLET BY MOUTH AS DIRECTED

## 2011-09-19 NOTE — Telephone Encounter (Signed)
Received incoming fax to advise per taking 2 tablets daily, noted pt recent PT/INR check noted taking alernation between 3mg  to 6mg , sent #[phone] with 3 refills to accommodate all intake

## 2011-10-12 ENCOUNTER — Ambulatory Visit (INDEPENDENT_AMBULATORY_CARE_PROVIDER_SITE_OTHER): Payer: Medicare PPO | Admitting: *Deleted

## 2011-10-12 VITALS — BP 140/78 | HR 55 | Wt 197.0 lb

## 2011-10-12 DIAGNOSIS — I4891 Unspecified atrial fibrillation: Secondary | ICD-10-CM

## 2011-10-12 DIAGNOSIS — Z7901 Long term (current) use of anticoagulants: Secondary | ICD-10-CM

## 2011-10-12 LAB — POCT INR: INR: 2.9

## 2011-10-12 NOTE — Patient Instructions (Signed)
Return to office in 4 weeks   Continue current dose: 2 tabs daily

## 2011-11-09 ENCOUNTER — Encounter: Payer: Self-pay | Admitting: *Deleted

## 2011-11-09 ENCOUNTER — Ambulatory Visit (INDEPENDENT_AMBULATORY_CARE_PROVIDER_SITE_OTHER): Payer: Medicare PPO | Admitting: *Deleted

## 2011-11-09 VITALS — BP 125/76 | HR 74 | Temp 98.4°F | Ht 60.0 in | Wt 194.0 lb

## 2011-11-09 DIAGNOSIS — Z7901 Long term (current) use of anticoagulants: Secondary | ICD-10-CM

## 2011-11-09 NOTE — Patient Instructions (Addendum)
Today PT/INR 2.2 Continue same dosage Re-check in 4 weeks

## 2011-11-26 ENCOUNTER — Encounter: Payer: Self-pay | Admitting: Internal Medicine

## 2011-11-26 ENCOUNTER — Ambulatory Visit (INDEPENDENT_AMBULATORY_CARE_PROVIDER_SITE_OTHER): Payer: Medicare PPO | Admitting: Internal Medicine

## 2011-11-26 VITALS — BP 140/66 | HR 69 | Ht 60.0 in | Wt 195.0 lb

## 2011-11-26 DIAGNOSIS — R0989 Other specified symptoms and signs involving the circulatory and respiratory systems: Secondary | ICD-10-CM

## 2011-11-26 DIAGNOSIS — I495 Sick sinus syndrome: Secondary | ICD-10-CM

## 2011-11-26 DIAGNOSIS — R0609 Other forms of dyspnea: Secondary | ICD-10-CM

## 2011-11-26 DIAGNOSIS — I10 Essential (primary) hypertension: Secondary | ICD-10-CM

## 2011-11-26 LAB — BASIC METABOLIC PANEL
CO2: 28 mEq/L (ref 19–32)
Calcium: 9 mg/dL (ref 8.4–10.5)
GFR: 45.88 mL/min — ABNORMAL LOW (ref >60.00)
Glucose, Bld: 125 mg/dL — ABNORMAL HIGH (ref 70–99)
Potassium: 4 mEq/L (ref 3.5–5.1)
Sodium: 140 mEq/L (ref 135–145)

## 2011-11-26 LAB — BRAIN NATRIURETIC PEPTIDE: Pro B Natriuretic peptide (BNP): 55 pg/mL (ref 0.0–100.0)

## 2011-11-26 NOTE — Assessment & Plan Note (Signed)
Her blood pressure is minimally elevated today. I've asked her to maintain a low-sodium diet and continue her current medications. She may require additional diuretic therapy.

## 2011-11-26 NOTE — Assessment & Plan Note (Signed)
Her symptoms have increased in frequency and severity. I've asked the patient undergo 2-D echo. We will also obtain additional laboratory evaluation. Based on the results, I may recommend diuretic therapy.

## 2011-11-26 NOTE — Assessment & Plan Note (Signed)
This is at least part of the problem. We discussed the importance of exercise, and weight loss.

## 2011-11-26 NOTE — Progress Notes (Signed)
HPI [name] returns today for followup. She is a very pleasant 78 year old woman, who is still working, who has a history of hypertension and bradycardia, who returns today for followup. Over the last 2 weeks, she complains of increasing dyspnea, are typically with exertion. She has not had syncope and she denies chest pain. She still works regular job. 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 chlorpheniramine (ALLERGY) 4 MG tablet Take 4 mg by mouth as directed.        Marland Kitchen CINNAMON PO 1 tab po qd      . fish oil-omega-3 fatty acids 1000 MG capsule 2 tabs po qd      . Multiple Vitamin (MULTIVITAMIN) tablet Take 1 tablet by mouth daily.        . simvastatin (ZOCOR) 40 MG tablet Take 20 mg by mouth at bedtime.      . VENTOLIN HFA 108 (90 BASE) MCG/ACT inhaler INHALE TWO PUFFS EVERY 4 HOURS AS NEEDED FOR COUGH AND  WHEEZING  18 g  2  . warfarin (COUMADIN) 3 MG tablet Take 1 tablet (3 mg total) by mouth as directed.  60 tablet  3  . DISCONTD: simvastatin (ZOCOR) 40 MG tablet Take 1 tablet (40 mg total) by mouth at bedtime.  30 tablet  5     Past Medical History  Diagnosis Date  . DIABETES-TYPE 2   . Other and unspecified hyperlipidemia   . OBESITY   . HYPERTENSION   . Intrinsic asthma, unspecified   . GERD   . KNEE PAIN   . Edema   . DYSPNEA ON EXERTION   . Atrial fibrillation     ROS:   All systems reviewed and negative except as noted in the HPI.   Past Surgical History  Procedure Date  . Abdominal hysterectomy      Family History  Problem Relation Age of Onset  . Coronary artery disease Mother   . Hypertension Mother   . Hypertension Father   . Prostate cancer Father      History   Social History  . Marital Status: Divorced    Spouse Name: N/A    Number of Children: N/A  . Years of Education: N/A   Occupational History   . Not on file.   Social History Main Topics  . Smoking status: Never Smoker   . Smokeless tobacco: Not on file  . Alcohol Use: No  . Drug Use: No  . Sexually Active: Not on file   Other Topics Concern  . Not on file   Social History Narrative  . No narrative on file     BP 140/66  Pulse 69  Ht 5' (1.524 m)  Wt 195 lb (88.451 kg)  BMI 38.08 kg/m2  SpO2 95%  Physical Exam:  Well appearing elderly woman, who looks younger than her stated age, obese, but in NAD HEENT: Unremarkable Neck:  7 cm JVD, no thyromegally Lungs:  Clear except for basilar rales HEART:  Regular rate rhythm, no murmurs, no rubs, no clicks Abd:  soft, positive bowel sounds, no organomegally, no rebound, no guarding Ext:  2 plus pulses, no edema, no cyanosis, no clubbing Skin:  No rashes no nodules Neuro:  CN II through XII intact, motor grossly intact  EKG Normal sinus rhythm, with sinus arrhythmia, and PACs.  Assess/Plan:

## 2011-11-26 NOTE — Patient Instructions (Addendum)
Your physician recommends that you continue on your current medications as directed. Please refer to the Current Medication list given to you today.   Your physician has requested that you have an echocardiogram. DX: DYSPNEA Echocardiography is a painless test that uses sound waves to create images of your heart. It provides your doctor with information about the size and shape of your heart and how well your heart's chambers and valves are working. This procedure takes approximately one hour. There are no restrictions for this procedure.PATIENT WILL SCHEDULE WHEN SHE CONTACTS WORK DUE TO SCHEDULING  Your physician recommends that HAVE  lab work TODAY: BMET/BNP   WE WILL CALL TO SCHEDULE AN APPOINTMENT DEPENDING ON TEST RESULTS AND WE WILL CALL WITH LAB RESULTS

## 2011-11-28 ENCOUNTER — Ambulatory Visit (HOSPITAL_COMMUNITY): Payer: Medicare PPO | Attending: Internal Medicine

## 2011-11-28 DIAGNOSIS — I059 Rheumatic mitral valve disease, unspecified: Secondary | ICD-10-CM | POA: Insufficient documentation

## 2011-11-28 DIAGNOSIS — I10 Essential (primary) hypertension: Secondary | ICD-10-CM | POA: Insufficient documentation

## 2011-11-28 DIAGNOSIS — I4891 Unspecified atrial fibrillation: Secondary | ICD-10-CM | POA: Insufficient documentation

## 2011-11-28 DIAGNOSIS — R0609 Other forms of dyspnea: Secondary | ICD-10-CM | POA: Insufficient documentation

## 2011-11-28 DIAGNOSIS — I369 Nonrheumatic tricuspid valve disorder, unspecified: Secondary | ICD-10-CM | POA: Insufficient documentation

## 2011-11-28 DIAGNOSIS — I495 Sick sinus syndrome: Secondary | ICD-10-CM

## 2011-11-28 DIAGNOSIS — R0989 Other specified symptoms and signs involving the circulatory and respiratory systems: Secondary | ICD-10-CM | POA: Insufficient documentation

## 2011-11-28 NOTE — Progress Notes (Signed)
Echocardiogram performed.  

## 2011-12-07 ENCOUNTER — Ambulatory Visit: Payer: Medicare PPO

## 2011-12-12 ENCOUNTER — Ambulatory Visit: Payer: Medicare PPO

## 2011-12-12 ENCOUNTER — Encounter: Payer: Self-pay | Admitting: *Deleted

## 2011-12-12 ENCOUNTER — Ambulatory Visit (INDEPENDENT_AMBULATORY_CARE_PROVIDER_SITE_OTHER): Payer: Medicare PPO | Admitting: *Deleted

## 2011-12-12 VITALS — BP 140/62 | HR 71 | Temp 98.3°F | Wt 197.4 lb

## 2011-12-12 DIAGNOSIS — Z7901 Long term (current) use of anticoagulants: Secondary | ICD-10-CM

## 2011-12-12 NOTE — Patient Instructions (Signed)
Todays PT/INR 1.8 Continue the same dosage Re-check in 2 weeks

## 2011-12-26 ENCOUNTER — Ambulatory Visit (INDEPENDENT_AMBULATORY_CARE_PROVIDER_SITE_OTHER): Payer: Medicare PPO | Admitting: *Deleted

## 2011-12-26 ENCOUNTER — Encounter: Payer: Self-pay | Admitting: *Deleted

## 2011-12-26 VITALS — BP 125/80 | HR 85 | Temp 98.4°F | Wt 198.4 lb

## 2011-12-26 DIAGNOSIS — Z7901 Long term (current) use of anticoagulants: Secondary | ICD-10-CM

## 2011-12-26 LAB — POCT INR: INR: 2.9

## 2011-12-26 NOTE — Patient Instructions (Addendum)
Today PT/INR 2.9 Continue same dosage Re-check in 4 weeks

## 2012-01-07 ENCOUNTER — Telehealth: Payer: Self-pay | Admitting: Internal Medicine

## 2012-01-07 NOTE — Telephone Encounter (Signed)
New Problem:    Patient called-in wanting to know the results of her latest ECHO on 11/28/11.  Please call back.

## 2012-01-07 NOTE — Telephone Encounter (Signed)
Patient called wanting to know results of echo done 11/28/11.Patient was told Dr.Taylor's nurse not in office today, will have her call back 01/08/12.Call patient at work [phone].

## 2012-01-08 NOTE — Telephone Encounter (Signed)
Called patient and let her know her echo was normal and that I would show Dr Ladona Ridgel as I do not think he has seen yet  This does not give any explanation for her SOB

## 2012-01-08 NOTE — Telephone Encounter (Signed)
Tried to call the patient twice at work and held for 5 min and was disconnected both times.  I then called her on her cell number to let her know her echo looked good.  She can call me back with further questions but it does not look as Dr Ladona Ridgel has seen the study.  I will show him and let her know if he feels f/u is indicated.  Echo was ordered for SOB and this does not show any reason for that

## 2012-01-28 ENCOUNTER — Ambulatory Visit (INDEPENDENT_AMBULATORY_CARE_PROVIDER_SITE_OTHER): Payer: Medicare PPO | Admitting: *Deleted

## 2012-01-28 VITALS — BP 160/78 | HR 73 | Wt 199.0 lb

## 2012-01-28 DIAGNOSIS — Z7901 Long term (current) use of anticoagulants: Secondary | ICD-10-CM

## 2012-01-28 DIAGNOSIS — I4891 Unspecified atrial fibrillation: Secondary | ICD-10-CM

## 2012-01-28 NOTE — Patient Instructions (Signed)
Return to office in 4 weeks  Continue current dose: 2 mg daily   Monitor BP average <140/90 ideal <135/85

## 2012-02-06 ENCOUNTER — Telehealth: Payer: Self-pay | Admitting: Family Medicine

## 2012-02-06 NOTE — Telephone Encounter (Signed)
refill COUMADIN 3 MG Take 1 tablet (3 mg total) by mouth as directed. #60 last fill 11.7.13, last ov wt/labs 6.20.13 V70.9

## 2012-02-07 ENCOUNTER — Other Ambulatory Visit: Payer: Self-pay | Admitting: *Deleted

## 2012-02-07 DIAGNOSIS — I4891 Unspecified atrial fibrillation: Secondary | ICD-10-CM

## 2012-02-07 MED ORDER — WARFARIN SODIUM 3 MG PO TABS: 3.0000 mg | ORAL_TABLET | ORAL | Status: DC

## 2012-02-07 NOTE — Telephone Encounter (Signed)
Refill for coumadin sent to pharmacy

## 2012-03-03 ENCOUNTER — Encounter: Payer: Self-pay | Admitting: Family Medicine

## 2012-03-03 ENCOUNTER — Ambulatory Visit: Payer: Medicare PPO

## 2012-03-03 ENCOUNTER — Ambulatory Visit (INDEPENDENT_AMBULATORY_CARE_PROVIDER_SITE_OTHER): Payer: Medicare PPO | Admitting: Family Medicine

## 2012-03-03 VITALS — BP 130/78 | HR 68 | Temp 98.4°F | Ht 59.25 in | Wt 195.8 lb

## 2012-03-03 DIAGNOSIS — J40 Bronchitis, not specified as acute or chronic: Secondary | ICD-10-CM

## 2012-03-03 MED ORDER — AZITHROMYCIN 250 MG PO TABS: 250.0000 mg | ORAL_TABLET | Freq: Every day | ORAL | Status: AC

## 2012-03-03 NOTE — Progress Notes (Signed)
  Subjective:    Patient ID: [name], female    DOB: 1930/02/11, 79 y.o.   MRN: [id number]  HPI URI- 'i think i have bronchitis'.  'i'm blowing and coughing up yellow'.  Started taking Tessalon w/ some relief.  sxs started 1 week ago.  No fever.  + fatigue.  Sinus pressure.  No ear pain.  + sick contacts.  Has been using inhaler more than usual.   Review of Systems For ROS see HPI     Objective:   Physical Exam  Vitals reviewed. Constitutional: She appears well-developed and well-nourished. No distress.  HENT:  Head: Normocephalic and atraumatic.       TMs normal bilaterally Mild nasal congestion Throat w/out erythema, edema, or exudate  Eyes: Conjunctivae normal and EOM are normal. Pupils are equal, round, and reactive to light.  Neck: Normal range of motion. Neck supple.  Cardiovascular: Normal rate, normal heart sounds and intact distal pulses.   No murmur heard. Pulmonary/Chest: Effort normal and breath sounds normal. No respiratory distress. She has no wheezes.       + hacking cough  Lymphadenopathy:    She has no cervical adenopathy.          Assessment & Plan:

## 2012-03-03 NOTE — Patient Instructions (Addendum)
This is a bronchitis Start the Zpack as directed Use the Tessalon as needed for cough Drink plenty of fluids REST! Call with any questions or concerns Hang in there!

## 2012-03-04 NOTE — Assessment & Plan Note (Signed)
New.  Due to pt's underlying asthma and age, low threshold to start abx.  Pt reports she tolerates Zpacks well.  Continue Tessalon prn.  Reviewed supportive care and red flags that should prompt return.  Pt expressed understanding and is in agreement w/ plan.

## 2012-03-11 ENCOUNTER — Ambulatory Visit (INDEPENDENT_AMBULATORY_CARE_PROVIDER_SITE_OTHER): Payer: Medicare PPO | Admitting: *Deleted

## 2012-03-11 VITALS — BP 160/80 | HR 71 | Wt 198.0 lb

## 2012-03-11 DIAGNOSIS — Z7901 Long term (current) use of anticoagulants: Secondary | ICD-10-CM

## 2012-03-11 DIAGNOSIS — I4891 Unspecified atrial fibrillation: Secondary | ICD-10-CM

## 2012-03-11 LAB — POCT INR: INR: 2.9

## 2012-03-11 NOTE — Patient Instructions (Signed)
Return to office in 2 weeks  Continue current dose:   2 tabs daily

## 2012-03-25 ENCOUNTER — Ambulatory Visit (INDEPENDENT_AMBULATORY_CARE_PROVIDER_SITE_OTHER): Payer: Medicare PPO

## 2012-03-25 VITALS — BP 150/60 | HR 60 | Temp 97.8°F | Ht 60.0 in | Wt 198.4 lb

## 2012-03-25 DIAGNOSIS — I4891 Unspecified atrial fibrillation: Secondary | ICD-10-CM

## 2012-03-25 NOTE — Patient Instructions (Addendum)
Follow up in month Continue your coumadin- 2 tabs daily Call with any questions or concerns Enjoy the snow!

## 2012-03-26 LAB — POCT INR: INR: 2.7

## 2012-04-25 ENCOUNTER — Ambulatory Visit (INDEPENDENT_AMBULATORY_CARE_PROVIDER_SITE_OTHER): Payer: Medicare PPO | Admitting: *Deleted

## 2012-04-25 VITALS — BP 140/82 | HR 64 | Wt 198.0 lb

## 2012-04-25 DIAGNOSIS — Z7901 Long term (current) use of anticoagulants: Secondary | ICD-10-CM

## 2012-04-25 DIAGNOSIS — I4891 Unspecified atrial fibrillation: Secondary | ICD-10-CM

## 2012-04-25 LAB — POCT INR: INR: 1.5

## 2012-04-25 NOTE — Patient Instructions (Signed)
Return to office in 2 weeks  New dosing: 3 tabs(9mg ) today and tomorrow then resume 2 tabs(6mg ) daily

## 2012-05-09 ENCOUNTER — Ambulatory Visit: Payer: Medicare PPO

## 2012-05-15 ENCOUNTER — Ambulatory Visit (INDEPENDENT_AMBULATORY_CARE_PROVIDER_SITE_OTHER): Payer: Medicare PPO

## 2012-05-15 VITALS — BP 130/78 | HR 71 | Wt 195.0 lb

## 2012-05-15 DIAGNOSIS — Z7901 Long term (current) use of anticoagulants: Secondary | ICD-10-CM

## 2012-05-15 DIAGNOSIS — I4891 Unspecified atrial fibrillation: Secondary | ICD-10-CM

## 2012-05-15 LAB — POCT INR: INR: 2

## 2012-05-15 NOTE — Patient Instructions (Addendum)
Current dose: 2 (3 mg tab) daily = 6 mg daily  Per Dr.Tabori: Continue with same dose 6 mg daily and recheck in 4 weeks

## 2012-06-10 ENCOUNTER — Other Ambulatory Visit: Payer: Self-pay | Admitting: Family Medicine

## 2012-06-12 ENCOUNTER — Encounter: Payer: Self-pay | Admitting: *Deleted

## 2012-06-12 ENCOUNTER — Ambulatory Visit (INDEPENDENT_AMBULATORY_CARE_PROVIDER_SITE_OTHER): Payer: Medicare PPO | Admitting: *Deleted

## 2012-06-12 VITALS — BP 180/68 | HR 66 | Temp 98.1°F | Wt 198.4 lb

## 2012-06-12 DIAGNOSIS — I4891 Unspecified atrial fibrillation: Secondary | ICD-10-CM

## 2012-06-12 DIAGNOSIS — Z7901 Long term (current) use of anticoagulants: Secondary | ICD-10-CM

## 2012-06-12 LAB — POCT INR: INR: 2.8

## 2012-06-12 NOTE — Patient Instructions (Addendum)
Todays INR:2.8.   Asked the pt to continue taking Warfarin 3mg  taking (2 tabs) daily.   Recheck next INR in 4 weeks.

## 2012-06-16 ENCOUNTER — Encounter: Payer: Self-pay | Admitting: *Deleted

## 2012-06-18 ENCOUNTER — Telehealth: Payer: Self-pay | Admitting: *Deleted

## 2012-06-18 NOTE — Telephone Encounter (Signed)
Called on (06-12-12) and spoke with the pt and informed her that Dr. Laury Axon would like for her to come by and let us recheck her BP since it was high this am when she came in for her INR check.  Asked the pt if she could come by the office today before 5:00 or tomorrow, and she stated that she does not get off work until 5:00 and will not be able to come on Friday.  Asked her if she could come on Monday (06-16-12) and she stated yes she could come after 3:00.  Pt was schedule for Monday (06-16-12) for BP check with the nurse.//AB/CMA

## 2012-07-10 ENCOUNTER — Ambulatory Visit (INDEPENDENT_AMBULATORY_CARE_PROVIDER_SITE_OTHER): Payer: Medicare PPO | Admitting: *Deleted

## 2012-07-10 VITALS — BP 130/80 | HR 73 | Temp 97.9°F | Resp 16 | Wt 198.5 lb

## 2012-07-10 DIAGNOSIS — Z7901 Long term (current) use of anticoagulants: Secondary | ICD-10-CM

## 2012-07-10 DIAGNOSIS — I4891 Unspecified atrial fibrillation: Secondary | ICD-10-CM

## 2012-07-10 NOTE — Patient Instructions (Addendum)
NEW DOSE: Remain on Current Dose   RETURN IN : 4 weeks

## 2012-07-16 ENCOUNTER — Other Ambulatory Visit: Payer: Self-pay | Admitting: Family Medicine

## 2012-07-18 NOTE — Telephone Encounter (Signed)
Rx sent to the pharmacy.//AB/CMA 

## 2012-08-07 ENCOUNTER — Ambulatory Visit: Payer: Medicare PPO

## 2012-08-11 ENCOUNTER — Ambulatory Visit (INDEPENDENT_AMBULATORY_CARE_PROVIDER_SITE_OTHER): Payer: Medicare PPO | Admitting: *Deleted

## 2012-08-11 DIAGNOSIS — Z7901 Long term (current) use of anticoagulants: Secondary | ICD-10-CM

## 2012-08-11 DIAGNOSIS — I4891 Unspecified atrial fibrillation: Secondary | ICD-10-CM

## 2012-08-11 NOTE — Patient Instructions (Addendum)
Return to office in 2 weeks  NEW Dosing: Hold today then 1 tabs daily except 2 tabs Tues, Thurs, Sat

## 2012-08-13 ENCOUNTER — Other Ambulatory Visit: Payer: Self-pay | Admitting: Family Medicine

## 2012-08-15 ENCOUNTER — Other Ambulatory Visit: Payer: Self-pay | Admitting: *Deleted

## 2012-08-15 DIAGNOSIS — E7800 Pure hypercholesterolemia, unspecified: Secondary | ICD-10-CM

## 2012-08-15 DIAGNOSIS — I4891 Unspecified atrial fibrillation: Secondary | ICD-10-CM

## 2012-08-15 MED ORDER — WARFARIN SODIUM 3 MG PO TABS: ORAL_TABLET | ORAL | Status: DC

## 2012-08-15 MED ORDER — SIMVASTATIN 40 MG PO TABS: ORAL_TABLET | ORAL | Status: DC

## 2012-08-15 NOTE — Telephone Encounter (Signed)
Refill for zocor and warfarin sent to [facility] on [address]

## 2012-08-25 ENCOUNTER — Ambulatory Visit (INDEPENDENT_AMBULATORY_CARE_PROVIDER_SITE_OTHER): Payer: Medicare PPO | Admitting: *Deleted

## 2012-08-25 VITALS — BP 162/70 | HR 60 | Temp 97.5°F | Wt 198.8 lb

## 2012-08-25 DIAGNOSIS — I4891 Unspecified atrial fibrillation: Secondary | ICD-10-CM

## 2012-08-25 DIAGNOSIS — Z7901 Long term (current) use of anticoagulants: Secondary | ICD-10-CM

## 2012-08-25 NOTE — Addendum Note (Signed)
Addended by: Shirlee More I on: 08/25/2012 02:33 PM   Modules accepted: Level of Service

## 2012-08-25 NOTE — Patient Instructions (Addendum)
Continue current plan. Follow up in one month for recheck.

## 2012-09-23 ENCOUNTER — Ambulatory Visit (INDEPENDENT_AMBULATORY_CARE_PROVIDER_SITE_OTHER): Payer: Medicare PPO

## 2012-09-23 VITALS — BP 130/78 | HR 72 | Temp 98.2°F | Wt 197.0 lb

## 2012-09-23 DIAGNOSIS — Z7901 Long term (current) use of anticoagulants: Secondary | ICD-10-CM

## 2012-09-23 DIAGNOSIS — I4891 Unspecified atrial fibrillation: Secondary | ICD-10-CM

## 2012-09-23 DIAGNOSIS — J45909 Unspecified asthma, uncomplicated: Secondary | ICD-10-CM

## 2012-09-23 MED ORDER — ALBUTEROL SULFATE HFA 108 (90 BASE) MCG/ACT IN AERS: INHALATION_SPRAY | RESPIRATORY_TRACT | Status: DC

## 2012-09-23 NOTE — Patient Instructions (Signed)
Take 2 tablets (6mg ) today, Wed, Thurs, and resume with 1 tablet (3mg ) Friday. We will recheck in two weeks.  Thank you for coming in today!!!

## 2012-10-01 ENCOUNTER — Other Ambulatory Visit: Payer: Self-pay

## 2012-10-03 ENCOUNTER — Other Ambulatory Visit: Payer: Self-pay | Admitting: Family Medicine

## 2012-10-07 ENCOUNTER — Ambulatory Visit (INDEPENDENT_AMBULATORY_CARE_PROVIDER_SITE_OTHER): Payer: Medicare PPO

## 2012-10-07 VITALS — BP 150/78 | HR 75 | Temp 97.8°F | Wt 196.2 lb

## 2012-10-07 DIAGNOSIS — I4891 Unspecified atrial fibrillation: Secondary | ICD-10-CM

## 2012-10-07 NOTE — Patient Instructions (Addendum)
Please take 2 tabs (6mg ) everyday except Monday and Friday take 1 tab (3mg ). Follow 2 weeks.  Hang in there we will get it!!!!

## 2012-10-08 NOTE — Telephone Encounter (Signed)
Rx sent to the pharmacy by e-script.//AB/CMA 

## 2012-10-21 ENCOUNTER — Ambulatory Visit (INDEPENDENT_AMBULATORY_CARE_PROVIDER_SITE_OTHER): Payer: Medicare PPO | Admitting: *Deleted

## 2012-10-21 VITALS — BP 150/60 | HR 88 | Temp 98.1°F | Wt 195.0 lb

## 2012-10-21 DIAGNOSIS — I4891 Unspecified atrial fibrillation: Secondary | ICD-10-CM

## 2012-10-21 DIAGNOSIS — Z7901 Long term (current) use of anticoagulants: Secondary | ICD-10-CM

## 2012-10-21 LAB — POCT INR: INR: 1.7

## 2012-10-21 NOTE — Patient Instructions (Addendum)
Todays INR:1.7.  Dr. Beverely Low would like for you to take 2 tablets (6) daily except 3 tablets(9) on Tues and Thurs.  Recheck next INR in 2 weeks.//AB/CMA

## 2012-11-04 ENCOUNTER — Ambulatory Visit (INDEPENDENT_AMBULATORY_CARE_PROVIDER_SITE_OTHER): Payer: Medicare PPO

## 2012-11-04 VITALS — BP 138/82 | HR 67 | Temp 98.1°F | Wt 192.2 lb

## 2012-11-04 DIAGNOSIS — I4891 Unspecified atrial fibrillation: Secondary | ICD-10-CM

## 2012-11-04 NOTE — Patient Instructions (Addendum)
Hold for 2 days; Thursday take 3mg ; Friday take 6mg ; Sa&Sun 3mg ; begin Monday with 6mg  M-F and 3mg  Sa&Sun Recheck in one week.

## 2012-11-11 ENCOUNTER — Ambulatory Visit (INDEPENDENT_AMBULATORY_CARE_PROVIDER_SITE_OTHER): Payer: Medicare PPO | Admitting: *Deleted

## 2012-11-11 VITALS — BP 126/68 | HR 70 | Temp 99.0°F | Wt 197.0 lb

## 2012-11-11 DIAGNOSIS — Z7901 Long term (current) use of anticoagulants: Secondary | ICD-10-CM

## 2012-11-11 DIAGNOSIS — E785 Hyperlipidemia, unspecified: Secondary | ICD-10-CM

## 2012-11-11 DIAGNOSIS — I4891 Unspecified atrial fibrillation: Secondary | ICD-10-CM

## 2012-11-11 MED ORDER — SIMVASTATIN 40 MG PO TABS: 20.0000 mg | ORAL_TABLET | Freq: Every day | ORAL | Status: DC

## 2012-11-11 MED ORDER — WARFARIN SODIUM 3 MG PO TABS: ORAL_TABLET | ORAL | Status: DC

## 2012-11-11 NOTE — Patient Instructions (Signed)
Take 6mg  on Mon, Tues, Wed and Fri. On Thur, Sat and Sun take 3 mg. Recheck INR in 2 weeks.

## 2012-11-26 ENCOUNTER — Ambulatory Visit (INDEPENDENT_AMBULATORY_CARE_PROVIDER_SITE_OTHER): Payer: Commercial Managed Care - HMO | Admitting: *Deleted

## 2012-11-26 VITALS — BP 132/72 | HR 79 | Temp 98.1°F | Wt 196.0 lb

## 2012-11-26 DIAGNOSIS — Z7901 Long term (current) use of anticoagulants: Secondary | ICD-10-CM

## 2012-11-26 DIAGNOSIS — I4891 Unspecified atrial fibrillation: Secondary | ICD-10-CM

## 2012-11-26 NOTE — Patient Instructions (Signed)
Take 6mg  daily (2 tablets) and recheck in 1 week.

## 2012-12-03 ENCOUNTER — Ambulatory Visit (INDEPENDENT_AMBULATORY_CARE_PROVIDER_SITE_OTHER): Payer: Medicare PPO | Admitting: *Deleted

## 2012-12-03 VITALS — BP 136/84 | HR 72 | Temp 98.2°F | Wt 196.2 lb

## 2012-12-03 DIAGNOSIS — Z7901 Long term (current) use of anticoagulants: Secondary | ICD-10-CM

## 2012-12-03 DIAGNOSIS — I4891 Unspecified atrial fibrillation: Secondary | ICD-10-CM

## 2012-12-03 NOTE — Patient Instructions (Signed)
Take 6mg  everday. Recheck 4 week

## 2012-12-04 NOTE — Addendum Note (Signed)
Addended by: Dion Body on: 12/04/2012 11:21 AM   Modules accepted: Orders

## 2012-12-10 ENCOUNTER — Encounter: Payer: Self-pay | Admitting: Family Medicine

## 2012-12-10 ENCOUNTER — Ambulatory Visit (INDEPENDENT_AMBULATORY_CARE_PROVIDER_SITE_OTHER): Payer: Medicare PPO | Admitting: Family Medicine

## 2012-12-10 VITALS — BP 170/94 | HR 91 | Temp 98.3°F | Resp 16 | Wt 195.5 lb

## 2012-12-10 DIAGNOSIS — J45901 Unspecified asthma with (acute) exacerbation: Secondary | ICD-10-CM

## 2012-12-10 MED ORDER — BENZONATATE 200 MG PO CAPS: 200.0000 mg | ORAL_CAPSULE | Freq: Three times a day (TID) | ORAL | Status: DC | PRN

## 2012-12-10 MED ORDER — PREDNISONE 10 MG PO TABS: ORAL_TABLET | ORAL | Status: DC

## 2012-12-10 MED ORDER — AZITHROMYCIN 250 MG PO TABS: ORAL_TABLET | ORAL | Status: DC

## 2012-12-10 MED ORDER — ALBUTEROL SULFATE (2.5 MG/3ML) 0.083% IN NEBU
2.5000 mg | INHALATION_SOLUTION | Freq: Once | RESPIRATORY_TRACT | Status: AC
  Administered 2012-12-10: 2.5 mg via RESPIRATORY_TRACT

## 2012-12-10 NOTE — Assessment & Plan Note (Signed)
New.  Wheezing improved s/p neb tx but pt remains rhonchorous.  Start Zpack, prednisone for airway inflammation, cough meds, albuterol HFA prn.  Reviewed supportive care and red flags that should prompt return.  Pt expressed understanding and is in agreement w/ plan.

## 2012-12-10 NOTE — Patient Instructions (Signed)
Start the Zpack as directed Take the prednisone as directed- take w/ food for inflammation Continue your albuterol inhaler- 2 puffs every 2-4 hrs as needed Call with any questions or concerns- particularly if you worsen Hang in there!!!

## 2012-12-10 NOTE — Progress Notes (Signed)
  Subjective:    Patient ID: [name], female    DOB: 09-Feb-1930, 79 y.o.   MRN: [id number]  HPI Cough- sxs started 1 week ago.  Cough was productive of 'foam w/ a big hunk of yellow'.  Pt was rushing to get here this AM b/c she woke up late.  Now SOB.  No fevers.  Hx of asthma.  Using Tessalon w/ some relief.  No N/V.  No facial pain/pressure.   Review of Systems For ROS see HPI     Objective:   Physical Exam  Vitals reviewed. Constitutional: She appears well-developed and well-nourished. No distress.  HENT:  Head: Normocephalic and atraumatic.  TMs normal bilaterally Mild nasal congestion Throat w/out erythema, edema, or exudate  Eyes: Conjunctivae and EOM are normal. Pupils are equal, round, and reactive to light.  Neck: Normal range of motion. Neck supple.  Cardiovascular: Normal rate, regular rhythm, normal heart sounds and intact distal pulses.   No murmur heard. Pulmonary/Chest: Effort normal. No respiratory distress. She has wheezes (diffuse wheezes w/ coarse BS- wheezes improved s/p neb but rhonchi remain).  + hacking cough  Lymphadenopathy:    She has no cervical adenopathy.          Assessment & Plan:

## 2012-12-18 ENCOUNTER — Ambulatory Visit (INDEPENDENT_AMBULATORY_CARE_PROVIDER_SITE_OTHER): Payer: Medicare PPO | Admitting: *Deleted

## 2012-12-18 VITALS — BP 130/86 | HR 55 | Temp 98.1°F | Wt 194.4 lb

## 2012-12-18 DIAGNOSIS — Z7901 Long term (current) use of anticoagulants: Secondary | ICD-10-CM

## 2012-12-18 DIAGNOSIS — I4891 Unspecified atrial fibrillation: Secondary | ICD-10-CM

## 2012-12-18 LAB — POCT INR: INR: 4.3

## 2012-12-18 NOTE — Patient Instructions (Signed)
Hold for 2 days (Thursday and Friday) Resume 6mg  everyday except on Tuesday and Friday 3mg . Recheck in 3 weeks

## 2012-12-31 ENCOUNTER — Ambulatory Visit: Payer: Medicare PPO

## 2013-01-01 ENCOUNTER — Other Ambulatory Visit: Payer: Self-pay

## 2013-01-08 ENCOUNTER — Ambulatory Visit (INDEPENDENT_AMBULATORY_CARE_PROVIDER_SITE_OTHER): Payer: Medicare PPO | Admitting: *Deleted

## 2013-01-08 VITALS — BP 126/80 | HR 69 | Temp 98.3°F | Wt 196.0 lb

## 2013-01-08 DIAGNOSIS — Z7901 Long term (current) use of anticoagulants: Secondary | ICD-10-CM

## 2013-01-08 DIAGNOSIS — I4891 Unspecified atrial fibrillation: Secondary | ICD-10-CM

## 2013-01-08 LAB — POCT INR: INR: 3.5

## 2013-01-08 NOTE — Patient Instructions (Signed)
Hold for today. Resume 6mg  everyday except on Tuesday and Friday 3mg . Recheck in 2 weeks

## 2013-01-20 ENCOUNTER — Ambulatory Visit (INDEPENDENT_AMBULATORY_CARE_PROVIDER_SITE_OTHER): Payer: Medicare HMO | Admitting: *Deleted

## 2013-01-20 VITALS — BP 122/80 | HR 68 | Wt 193.4 lb

## 2013-01-20 DIAGNOSIS — I4891 Unspecified atrial fibrillation: Secondary | ICD-10-CM

## 2013-01-20 DIAGNOSIS — Z7901 Long term (current) use of anticoagulants: Secondary | ICD-10-CM

## 2013-01-20 NOTE — Patient Instructions (Signed)
6mg everyday except on Tuesday and Friday 3mg. Recheck in 4 weeks  

## 2013-02-16 ENCOUNTER — Ambulatory Visit (INDEPENDENT_AMBULATORY_CARE_PROVIDER_SITE_OTHER): Payer: Medicare HMO | Admitting: *Deleted

## 2013-02-16 VITALS — BP 130/80 | HR 67 | Temp 98.3°F | Wt 192.4 lb

## 2013-02-16 DIAGNOSIS — I4891 Unspecified atrial fibrillation: Secondary | ICD-10-CM

## 2013-02-16 DIAGNOSIS — Z7901 Long term (current) use of anticoagulants: Secondary | ICD-10-CM

## 2013-02-16 LAB — POCT INR: INR: 2.9

## 2013-02-16 NOTE — Patient Instructions (Signed)
6mg everyday except on Tuesday and Friday 3mg. Recheck in 4 weeks  

## 2013-03-17 ENCOUNTER — Ambulatory Visit (INDEPENDENT_AMBULATORY_CARE_PROVIDER_SITE_OTHER): Payer: Medicare HMO | Admitting: *Deleted

## 2013-03-17 VITALS — BP 124/82 | HR 64 | Temp 97.8°F | Wt 188.0 lb

## 2013-03-17 DIAGNOSIS — Z7901 Long term (current) use of anticoagulants: Secondary | ICD-10-CM

## 2013-03-17 DIAGNOSIS — I4891 Unspecified atrial fibrillation: Secondary | ICD-10-CM

## 2013-03-17 LAB — POCT INR: INR: 3

## 2013-03-17 NOTE — Patient Instructions (Signed)
 6mg  everyday except on Tuesday and Friday 3mg . Recheck in 4 weeks

## 2013-04-07 ENCOUNTER — Encounter: Payer: Self-pay | Admitting: General Practice

## 2013-04-14 ENCOUNTER — Ambulatory Visit: Payer: Medicare HMO

## 2013-04-21 ENCOUNTER — Ambulatory Visit (INDEPENDENT_AMBULATORY_CARE_PROVIDER_SITE_OTHER): Payer: Commercial Managed Care - HMO | Admitting: *Deleted

## 2013-04-21 VITALS — BP 130/72 | HR 62 | Temp 98.3°F | Wt 187.0 lb

## 2013-04-21 DIAGNOSIS — I4891 Unspecified atrial fibrillation: Secondary | ICD-10-CM

## 2013-04-21 DIAGNOSIS — Z7901 Long term (current) use of anticoagulants: Secondary | ICD-10-CM

## 2013-04-21 LAB — POCT INR: INR: 2.6

## 2013-04-21 MED ORDER — WARFARIN SODIUM 3 MG PO TABS: ORAL_TABLET | ORAL | Status: DC

## 2013-04-21 NOTE — Patient Instructions (Signed)
 6mg  everyday except on Tuesday and Friday 3mg . Recheck in 4 weeks

## 2013-04-21 NOTE — Addendum Note (Signed)
Addended by: Baldwin JamaicaJOHNSON, Shaton Lore G on: 04/21/2013 12:40 PM   Modules accepted: Orders

## 2013-04-22 ENCOUNTER — Encounter: Payer: Self-pay | Admitting: Family Medicine

## 2013-04-22 ENCOUNTER — Ambulatory Visit (INDEPENDENT_AMBULATORY_CARE_PROVIDER_SITE_OTHER): Payer: Medicare HMO | Admitting: Family Medicine

## 2013-04-22 VITALS — BP 124/84 | HR 72 | Temp 98.4°F | Resp 16 | Wt 186.2 lb

## 2013-04-22 DIAGNOSIS — Z0289 Encounter for other administrative examinations: Secondary | ICD-10-CM

## 2013-04-22 DIAGNOSIS — Z008 Encounter for other general examination: Secondary | ICD-10-CM

## 2013-04-22 NOTE — Progress Notes (Signed)
   Subjective:    Patient ID: [name], female    DOB: [birth year], 80 y.o.   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  Manager is making her fill out paperwork to ensure she is competent to do her job and 'essential physical activities'.   Review of Systems For ROS see HPI     Objective:   Physical Exam  Vitals reviewed. Constitutional: She is oriented to person, place, and time. She appears well-developed and well-nourished. No distress.  Neurological: She is alert and oriented to person, place, and time.  Skin: Skin is warm and dry.  Psychiatric: She has a normal mood and affect. Her behavior is normal. Thought content normal.          Assessment & Plan:

## 2013-04-22 NOTE — Patient Instructions (Signed)
Schedule your complete physical in the next 3 months Call if you need anything else from us in regards to work Call with any questions or concerns Hang in there!!!

## 2013-04-22 NOTE — Progress Notes (Signed)
Pre visit review using our clinic review tool, if applicable. No additional management support is needed unless otherwise documented below in the visit note. 

## 2013-04-23 DIAGNOSIS — Z008 Encounter for other general examination: Secondary | ICD-10-CM | POA: Insufficient documentation

## 2013-04-23 NOTE — Assessment & Plan Note (Signed)
New.  Completed pt's forms as required by work to outline ability to complete job specific tasks.  Pt feels there is age discrimination at play but after completing her forms, she is unable to do most of the tasks identified in her job description.  Will follow and assist as able

## 2013-05-19 ENCOUNTER — Ambulatory Visit (INDEPENDENT_AMBULATORY_CARE_PROVIDER_SITE_OTHER): Payer: Medicare HMO

## 2013-05-19 VITALS — BP 166/64 | HR 65 | Temp 97.8°F | Wt 192.6 lb

## 2013-05-19 DIAGNOSIS — Z7901 Long term (current) use of anticoagulants: Secondary | ICD-10-CM

## 2013-05-19 LAB — POCT INR: INR: 3.3

## 2013-05-19 NOTE — Patient Instructions (Addendum)
Hold today's 3mg  dose. Resume tomorrow with 6mg  all days except Tuesday and Friday take 3mg .   Recheck in 2 weeks 06/05/2013 at 9am  Physical is Thursday April 16 at 10am

## 2013-05-19 NOTE — Progress Notes (Signed)
Pre visit review using our clinic review tool, if applicable. No additional management support is needed unless otherwise documented below in the visit note. 

## 2013-06-05 ENCOUNTER — Ambulatory Visit (INDEPENDENT_AMBULATORY_CARE_PROVIDER_SITE_OTHER): Payer: Commercial Managed Care - HMO

## 2013-06-05 VITALS — BP 153/66 | HR 69 | Temp 97.6°F | Wt 193.8 lb

## 2013-06-05 DIAGNOSIS — J45909 Unspecified asthma, uncomplicated: Secondary | ICD-10-CM | POA: Diagnosis not present

## 2013-06-05 DIAGNOSIS — Z7901 Long term (current) use of anticoagulants: Secondary | ICD-10-CM | POA: Diagnosis not present

## 2013-06-05 DIAGNOSIS — I4891 Unspecified atrial fibrillation: Secondary | ICD-10-CM

## 2013-06-05 LAB — POCT INR: INR: 3.6

## 2013-06-05 MED ORDER — ALBUTEROL SULFATE HFA 108 (90 BASE) MCG/ACT IN AERS: INHALATION_SPRAY | RESPIRATORY_TRACT | Status: DC

## 2013-06-05 NOTE — Progress Notes (Signed)
Pre visit review using our clinic review tool, if applicable. No additional management support is needed unless otherwise documented below in the visit note. 

## 2013-06-05 NOTE — Patient Instructions (Addendum)
Hold Fri (3mg ) and Sat (6mg ) resume on Sunday with 6mg  all days except Tues and Fri 3mg . Recheck in 2 weeks  OK to Rx inhaler per Dr Beverely Lowabori

## 2013-06-10 ENCOUNTER — Telehealth: Payer: Self-pay

## 2013-06-10 NOTE — Telephone Encounter (Signed)
Medication and allergies:  Reviewed and updated  90 day supply/mail order: n/a Local pharmacy:  [id number] - [place], [place] - [id number]   Immunizations due:  PNA and Zoster-Due   A/P: No changes to personal, family history or past surgical hx CCS- DUE MMG- 07/17/11-negative  BD- 07/17/11- osteopenia  Td- 04/11/11  PNA- declined Z- declined  To Discuss with Provider: Nothing at this time.

## 2013-06-10 NOTE — Telephone Encounter (Signed)
Left message for call back Non-identifiable   Pap CCS MMG- 07/17/11-negative BD- 07/17/11- osteopenia Flu Td- 04/11/11 PNA Z

## 2013-06-11 ENCOUNTER — Ambulatory Visit (INDEPENDENT_AMBULATORY_CARE_PROVIDER_SITE_OTHER): Payer: Medicare HMO | Admitting: Family Medicine

## 2013-06-11 ENCOUNTER — Encounter: Payer: Self-pay | Admitting: Family Medicine

## 2013-06-11 VITALS — BP 140/72 | HR 66 | Temp 98.5°F | Resp 17 | Wt 191.0 lb

## 2013-06-11 DIAGNOSIS — E119 Type 2 diabetes mellitus without complications: Secondary | ICD-10-CM

## 2013-06-11 DIAGNOSIS — E785 Hyperlipidemia, unspecified: Secondary | ICD-10-CM

## 2013-06-11 DIAGNOSIS — M8580 Other specified disorders of bone density and structure, unspecified site: Secondary | ICD-10-CM

## 2013-06-11 DIAGNOSIS — M949 Disorder of cartilage, unspecified: Secondary | ICD-10-CM

## 2013-06-11 DIAGNOSIS — M899 Disorder of bone, unspecified: Secondary | ICD-10-CM

## 2013-06-11 DIAGNOSIS — I10 Essential (primary) hypertension: Secondary | ICD-10-CM

## 2013-06-11 DIAGNOSIS — I4891 Unspecified atrial fibrillation: Secondary | ICD-10-CM

## 2013-06-11 DIAGNOSIS — Z0000 Encounter for general adult medical examination without abnormal findings: Secondary | ICD-10-CM

## 2013-06-11 LAB — CBC WITH DIFFERENTIAL/PLATELET
BASOS ABS: 0 10*3/uL (ref 0.0–0.1)
Basophils Relative: 0.4 % (ref 0.0–3.0)
Eosinophils Absolute: 0.1 10*3/uL (ref 0.0–0.7)
Eosinophils Relative: 1.5 % (ref 0.0–5.0)
HCT: 42.5 % (ref 36.0–46.0)
Hemoglobin: 14 g/dL (ref 12.0–15.0)
LYMPHS PCT: 23.8 % (ref 12.0–46.0)
Lymphs Abs: 1.1 10*3/uL (ref 0.7–4.0)
MCHC: 33 g/dL (ref 30.0–36.0)
MCV: 88.5 fl (ref 78.0–100.0)
Monocytes Absolute: 0.4 10*3/uL (ref 0.1–1.0)
Monocytes Relative: 9.5 % (ref 3.0–12.0)
NEUTROS PCT: 64.8 % (ref 43.0–77.0)
Neutro Abs: 3 10*3/uL (ref 1.4–7.7)
Platelets: 160 10*3/uL (ref 150.0–400.0)
RBC: 4.8 Mil/uL (ref 3.87–5.11)
RDW: 14.9 % — ABNORMAL HIGH (ref 11.5–14.6)
WBC: 4.6 10*3/uL (ref 4.5–10.5)

## 2013-06-11 LAB — HEPATIC FUNCTION PANEL
ALT: 16 U/L (ref 0–35)
AST: 21 U/L (ref 0–37)
Albumin: 3.9 g/dL (ref 3.5–5.2)
Alkaline Phosphatase: 71 U/L (ref 39–117)
BILIRUBIN DIRECT: 0 mg/dL (ref 0.0–0.3)
TOTAL PROTEIN: 7.1 g/dL (ref 6.0–8.3)
Total Bilirubin: 0.9 mg/dL (ref 0.3–1.2)

## 2013-06-11 LAB — BASIC METABOLIC PANEL
BUN: 29 mg/dL — ABNORMAL HIGH (ref 6–23)
CHLORIDE: 106 meq/L (ref 96–112)
CO2: 26 meq/L (ref 19–32)
Calcium: 9.1 mg/dL (ref 8.4–10.5)
Creatinine, Ser: 1.2 mg/dL (ref 0.4–1.2)
GFR: 45.71 mL/min — ABNORMAL LOW (ref >60.00)
Glucose, Bld: 108 mg/dL — ABNORMAL HIGH (ref 70–99)
POTASSIUM: 4.2 meq/L (ref 3.5–5.1)
SODIUM: 140 meq/L (ref 135–145)

## 2013-06-11 LAB — LIPID PANEL
Cholesterol: 167 mg/dL (ref 0–200)
HDL: 42 mg/dL (ref >39.00)
LDL CALC: 98 mg/dL (ref 0–99)
Total CHOL/HDL Ratio: 4
Triglycerides: 135 mg/dL (ref 0.0–149.0)
VLDL: 27 mg/dL (ref 0.0–40.0)

## 2013-06-11 LAB — HEMOGLOBIN A1C: HEMOGLOBIN A1C: 6.1 % (ref 4.6–6.5)

## 2013-06-11 LAB — TSH: TSH: 2.59 u[IU]/mL (ref 0.35–5.50)

## 2013-06-11 NOTE — Assessment & Plan Note (Signed)
Chronic problem, diet controlled.  Asymptomatic.  UTD on eye exam.  Check labs.  Start meds prn

## 2013-06-11 NOTE — Assessment & Plan Note (Signed)
Chronic problem.  Tolerating statin w/o difficulty.  Check labs.  Adjust meds prn  

## 2013-06-11 NOTE — Progress Notes (Signed)
Pre visit review using our clinic review tool, if applicable. No additional management support is needed unless otherwise documented below in the visit note. 

## 2013-06-11 NOTE — Patient Instructions (Signed)
Follow up in 6 months to recheck sugar, BP, and cholesterol Follow up as scheduled for PT check We'll notify you of your lab results Someone will call you to [facility] Call with any questions or concerns Hang in there!!!

## 2013-06-11 NOTE — Progress Notes (Signed)
   Subjective:    Patient ID: [name], female    DOB: February 06, 1930, 80 y.o.   MRN: [id number]  HPI Here today for CPE.  Risk Factors: DM- chronic problem, currently diet controlled. HTN- chronic problem, adequate control today.  Not currently on meds.  No CP, SOB, HAs, visual changes, edema. Hyperlipidemia- chronic problem, on Simvastatin daily.  Denies abd pain, N/V, myalgias. Osteopenia- due for DEXA in June Physical Activity: limited Fall Risk: high risk due to cane use Depression: denies current sxs Hearing: decreased to conversational tones and whispered voice ADL's: independent Cognitive: normal linear thought process, memory and attention intact Home Safety: safe at home Height, Weight, BMI, Visual Acuity: see vitals, vision corrected to 20/20 w/ glasses Counseling: pt refused colonoscopy.  No need for pap.  Due for mammo and DEXA in June ([facility]) Labs Ordered: See A&P Care Plan: See A&P    Review of Systems Patient reports no vision/ hearing changes, adenopathy,fever, weight change,  persistant/recurrent hoarseness , swallowing issues, chest pain, palpitations, edema, persistant/recurrent cough, hemoptysis, dyspnea (rest/exertional/paroxysmal nocturnal), gastrointestinal bleeding (melena, rectal bleeding), abdominal pain, significant heartburn, bowel changes, GU symptoms (dysuria, hematuria, incontinence), Gyn symptoms (abnormal  bleeding, pain),  syncope, focal weakness, memory loss, numbness & tingling, skin/hair/nail changes, abnormal bruising or bleeding, anxiety, or depression.     Objective:   Physical Exam General Appearance:    Alert, cooperative, no distress, appears stated age  Head:    Normocephalic, without obvious abnormality, atraumatic  Eyes:    PERRL, conjunctiva/corneas clear, EOM's intact, fundi    benign, both eyes  Ears:    Normal TM's and external ear canals, both ears  Nose:   Nares normal, septum midline, mucosa normal, no drainage    or sinus  tenderness  Throat:   Lips, mucosa, and tongue normal; teeth and gums normal  Neck:   Supple, symmetrical, trachea midline, no adenopathy;    Thyroid: no enlargement/tenderness/nodules  Back:     Symmetric, no curvature, ROM normal, no CVA tenderness  Lungs:     Clear to auscultation bilaterally, respirations unlabored  Chest Wall:    No tenderness or deformity   Heart:    Regular rate and rhythm, S1 and S2 normal, no murmur, rub   or gallop  Breast Exam:    Deferred to mammo  Abdomen:     Soft, non-tender, bowel sounds active all four quadrants,    no masses, no organomegaly  Genitalia:    Deferred   Rectal:    Extremities:   Extremities normal, atraumatic, no cyanosis or edema  Pulses:   2+ and symmetric all extremities  Skin:   Skin color, texture, turgor normal, no rashes or lesions  Lymph nodes:   Cervical, supraclavicular, and axillary nodes normal  Neurologic:   CNII-XII intact, normal strength, sensation and reflexes    throughout         Assessment & Plan:

## 2013-06-11 NOTE — Assessment & Plan Note (Signed)
Pt's PE WNL.  Refusing colonoscopy.  UTD on mammo and DEXA.  Check labs.  Anticipatory guidance provided.

## 2013-06-11 NOTE — Assessment & Plan Note (Signed)
Pt's HR is regular today.  Asymptomatic.  Continue coumadin.  Will follow.

## 2013-06-11 NOTE — Assessment & Plan Note (Signed)
Currently controlled w/ low salt diet.  Asymptomatic.  No meds at this time.  Will follow.

## 2013-06-11 NOTE — Assessment & Plan Note (Signed)
Noted on last DEXA.  Due for repeat DEXA this summer.  Check Vit D level.  Replete prn.

## 2013-06-12 ENCOUNTER — Telehealth: Payer: Self-pay | Admitting: Family Medicine

## 2013-06-12 NOTE — Telephone Encounter (Signed)
Relevant patient education assigned to patient using Emmi. ° °

## 2013-06-16 ENCOUNTER — Ambulatory Visit (INDEPENDENT_AMBULATORY_CARE_PROVIDER_SITE_OTHER): Payer: Medicare HMO | Admitting: *Deleted

## 2013-06-16 VITALS — BP 122/64 | HR 70 | Temp 98.1°F | Wt 190.0 lb

## 2013-06-16 DIAGNOSIS — Z7901 Long term (current) use of anticoagulants: Secondary | ICD-10-CM

## 2013-06-16 DIAGNOSIS — I4891 Unspecified atrial fibrillation: Secondary | ICD-10-CM

## 2013-06-16 LAB — POCT INR
INR: 2.6
INR: 2.6

## 2013-06-16 NOTE — Patient Instructions (Signed)
Continue with 6mg  everyday except on Tuesday and Friday 3mg . Recheck in 4 weeks

## 2013-06-17 LAB — VITAMIN D 1,25 DIHYDROXY
Vitamin D 1, 25 (OH)2 Total: 51 pg/mL (ref 18–72)
Vitamin D3 1, 25 (OH)2: 51 pg/mL

## 2013-06-24 ENCOUNTER — Telehealth: Payer: Self-pay

## 2013-06-24 NOTE — Telephone Encounter (Signed)
Relevant patient education assigned to patient using Emmi. ° °

## 2013-07-14 ENCOUNTER — Ambulatory Visit (INDEPENDENT_AMBULATORY_CARE_PROVIDER_SITE_OTHER): Payer: Commercial Managed Care - HMO

## 2013-07-14 VITALS — BP 161/67 | HR 58 | Temp 98.1°F | Wt 192.2 lb

## 2013-07-14 DIAGNOSIS — Z7901 Long term (current) use of anticoagulants: Secondary | ICD-10-CM

## 2013-07-14 DIAGNOSIS — I4891 Unspecified atrial fibrillation: Secondary | ICD-10-CM

## 2013-07-14 LAB — POCT INR: INR: 1.7

## 2013-07-14 NOTE — Patient Instructions (Signed)
Take 6mg  today. Resume tomorrow with 6mg  all days except Tues and Friday take 3mg . Recheck in 2 weeks. Wednesday 07/29/2013 9am  Vitamin K and Warfarin Warfarin is a drug that helps thin your blood. If you take warfarin, you will need to follow a diet that has a consistent amount of vitamin K-containing foods. Sudden changes in the amount of vitamin K that you eat can cause the medicine to not work as well as it should. You do not need to avoid vitamin K-containing foods. FOODS HIGH IN VITAMIN K  Broccoli, fresh or frozen, cooked, 1 cup  Greens, fresh or frozen, cooked (beet, collard, mustard, turnip),  cup  Kale, fresh or frozen, cooked,  cup  Parsley, raw, 10 sprigs  Spinach, frozen or canned, cooked,  cup FOODS MODERATELY HIGH IN VITAMIN K  Bok choy, cooked, 1 cup  Broccoli, raw, 1 cup  Brussels sprouts, fresh or frozen, cooked,  cup  Cabbage, cooked, 1 cup  Endive, raw, 1 cup  Green leaf lettuce, raw, 1 cup  Green scallions, raw,  cup  Okra, frozen, cooked, 1 cup  Romaine lettuce, raw, 1 cup  Sauerkraut, canned, 1 cup  Spinach, raw, 1 cup KEEPING YOUR INTAKE CONSISTENT  Note the foods that are high in vitamin K listed above. How many times per week do you eat these foods?  For example, you might eat cooked broccoli 1 time a week and a leafy green salad 3 times a week. In that case, you should have 1 high vitamin K food each week and 3 moderately high foods each week.  Remember, you do not need to eat the same foods each week. You do need to keep your vitamin K intake levels the same. Notify your caregiver before changing your diet. MORE TIPS  Take your warfarin as instructed.  It is okay to take a multivitamin that contains vitamin K. Just be sure to take it every day.  Discuss any supplements or whole food supplements with your pharmacist. Document Released: 12/10/2008 Document Revised: 08/14/2011 Document Reviewed: 12/10/2008 ExitCare Patient  Information 2014 [employer].

## 2013-07-14 NOTE — Progress Notes (Signed)
Pre visit review using our clinic review tool, if applicable. No additional management support is needed unless otherwise documented below in the visit note. 

## 2013-07-17 LAB — HM MAMMOGRAPHY: HM MAMMO: NORMAL

## 2013-07-17 LAB — HM DEXA SCAN

## 2013-07-22 ENCOUNTER — Encounter: Payer: Self-pay | Admitting: General Practice

## 2013-07-29 ENCOUNTER — Ambulatory Visit (INDEPENDENT_AMBULATORY_CARE_PROVIDER_SITE_OTHER): Payer: Commercial Managed Care - HMO | Admitting: *Deleted

## 2013-07-29 VITALS — BP 142/72 | HR 63 | Temp 97.6°F | Wt 191.6 lb

## 2013-07-29 DIAGNOSIS — Z7901 Long term (current) use of anticoagulants: Secondary | ICD-10-CM

## 2013-07-29 DIAGNOSIS — I4891 Unspecified atrial fibrillation: Secondary | ICD-10-CM

## 2013-07-29 LAB — POCT INR: INR: 3

## 2013-07-29 NOTE — Patient Instructions (Signed)
Take 6mg  today then resume with 6mg  everyday except on Tuesday and Friday 3mg . Recheck in 4 weeks

## 2013-07-29 NOTE — Progress Notes (Signed)
Pre-visit discussion using our clinic review tool. No additional management support is needed unless otherwise documented below in the visit note.  

## 2013-07-30 ENCOUNTER — Encounter: Payer: Self-pay | Admitting: General Practice

## 2013-08-12 ENCOUNTER — Encounter: Payer: Self-pay | Admitting: Family Medicine

## 2013-08-26 ENCOUNTER — Ambulatory Visit (INDEPENDENT_AMBULATORY_CARE_PROVIDER_SITE_OTHER): Payer: Commercial Managed Care - HMO | Admitting: *Deleted

## 2013-08-26 VITALS — BP 132/60 | HR 63 | Temp 98.6°F | Wt 191.0 lb

## 2013-08-26 DIAGNOSIS — I4891 Unspecified atrial fibrillation: Secondary | ICD-10-CM

## 2013-08-26 DIAGNOSIS — Z7901 Long term (current) use of anticoagulants: Secondary | ICD-10-CM

## 2013-08-26 LAB — POCT INR
INR: 1.7
INR: 1.7

## 2013-08-26 NOTE — Patient Instructions (Signed)
Take 6mg  everyday. Recheck in 2 weeks

## 2013-09-09 ENCOUNTER — Ambulatory Visit (INDEPENDENT_AMBULATORY_CARE_PROVIDER_SITE_OTHER): Payer: Commercial Managed Care - HMO

## 2013-09-09 VITALS — BP 152/98 | HR 63 | Temp 98.1°F | Wt 190.4 lb

## 2013-09-09 DIAGNOSIS — Z7901 Long term (current) use of anticoagulants: Secondary | ICD-10-CM

## 2013-09-09 DIAGNOSIS — I4891 Unspecified atrial fibrillation: Secondary | ICD-10-CM

## 2013-09-09 LAB — POCT INR: INR: 4

## 2013-09-09 NOTE — Patient Instructions (Signed)
Take 3mg  (1 tab) this evening(Wed) resume with 6mg  (2 tabs) every evening except Tues and Fri take 3mg .  Recheck in 2 weeks. Wed 09/23/2013 at 9am.

## 2013-09-09 NOTE — Progress Notes (Signed)
Pre visit review using our clinic review tool, if applicable. No additional management support is needed unless otherwise documented below in the visit note. 

## 2013-09-23 ENCOUNTER — Ambulatory Visit (INDEPENDENT_AMBULATORY_CARE_PROVIDER_SITE_OTHER): Payer: Commercial Managed Care - HMO

## 2013-09-23 VITALS — BP 158/76 | HR 65 | Temp 98.4°F | Wt 191.0 lb

## 2013-09-23 DIAGNOSIS — I4891 Unspecified atrial fibrillation: Secondary | ICD-10-CM

## 2013-09-23 DIAGNOSIS — Z7901 Long term (current) use of anticoagulants: Secondary | ICD-10-CM

## 2013-09-23 LAB — POCT INR: INR: 1.4

## 2013-09-23 NOTE — Progress Notes (Signed)
Pre visit review using our clinic review tool, if applicable. No additional management support is needed unless otherwise documented below in the visit note. 

## 2013-09-23 NOTE — Patient Instructions (Addendum)
Take Coumadin 6 mg (2 tablets) every day. Recheck in 2 weeks ( 10/07/13 @ 9:15 am).

## 2013-10-07 ENCOUNTER — Ambulatory Visit (INDEPENDENT_AMBULATORY_CARE_PROVIDER_SITE_OTHER): Payer: Commercial Managed Care - HMO

## 2013-10-07 VITALS — BP 136/62 | HR 64 | Temp 98.4°F | Wt 186.4 lb

## 2013-10-07 DIAGNOSIS — I4891 Unspecified atrial fibrillation: Secondary | ICD-10-CM

## 2013-10-07 DIAGNOSIS — Z7901 Long term (current) use of anticoagulants: Secondary | ICD-10-CM

## 2013-10-07 LAB — POCT INR: INR: 3.4

## 2013-10-07 NOTE — Patient Instructions (Signed)
Hold Coumadin today.  Then take Coumadin 6 mg (2 tablets) every day except on Sat and Sun take 3 mg (1 tablet).   Recheck in 2 weeks (10/21/13 @ 0900).

## 2013-10-21 ENCOUNTER — Ambulatory Visit (INDEPENDENT_AMBULATORY_CARE_PROVIDER_SITE_OTHER): Payer: Commercial Managed Care - HMO

## 2013-10-21 VITALS — BP 138/64 | HR 62 | Temp 97.6°F | Wt 189.0 lb

## 2013-10-21 DIAGNOSIS — Z7901 Long term (current) use of anticoagulants: Secondary | ICD-10-CM

## 2013-10-21 DIAGNOSIS — I4891 Unspecified atrial fibrillation: Secondary | ICD-10-CM

## 2013-10-21 LAB — POCT INR: INR: 2

## 2013-10-21 NOTE — Progress Notes (Signed)
Pre-visit discussion using our clinic review tool. No additional management support is needed unless otherwise documented below in the visit note.  

## 2013-10-21 NOTE — Patient Instructions (Signed)
Take  (2tabs) all days except Sat and Sun take  (1tab)  Recheck in two weeks.

## 2013-11-04 ENCOUNTER — Ambulatory Visit (INDEPENDENT_AMBULATORY_CARE_PROVIDER_SITE_OTHER): Payer: Commercial Managed Care - HMO

## 2013-11-04 VITALS — BP 132/64 | HR 68 | Temp 98.5°F | Wt 189.6 lb

## 2013-11-04 DIAGNOSIS — Z7901 Long term (current) use of anticoagulants: Secondary | ICD-10-CM

## 2013-11-04 DIAGNOSIS — I4891 Unspecified atrial fibrillation: Secondary | ICD-10-CM

## 2013-11-04 LAB — POCT INR: INR: 2.2

## 2013-11-04 NOTE — Patient Instructions (Signed)
Continue to take Coumadin 6 mg (2 tablets) every day except on Sat and Sun take 3 mg (1 tablet).   Recheck in 4 weeks (12/01/13 @ 9:15 am).

## 2013-11-04 NOTE — Progress Notes (Signed)
Pre visit review using our clinic review tool, if applicable. No additional management support is needed unless otherwise documented below in the visit note. 

## 2013-12-01 ENCOUNTER — Ambulatory Visit (INDEPENDENT_AMBULATORY_CARE_PROVIDER_SITE_OTHER): Payer: Commercial Managed Care - HMO

## 2013-12-01 VITALS — BP 136/72 | HR 55 | Temp 98.5°F | Wt 185.6 lb

## 2013-12-01 DIAGNOSIS — Z7901 Long term (current) use of anticoagulants: Secondary | ICD-10-CM

## 2013-12-01 LAB — POCT INR: INR: 2.1

## 2013-12-01 NOTE — Patient Instructions (Signed)
Your INR today was 2.1 which is ok per Dr.Tabori. She wants you to continue to take coumadin 6 mg (2 tablet) daily except Sat and Sun take 3 mg (1 tablet) and recheck levels in 4 weeks or sooner if needed.

## 2013-12-10 ENCOUNTER — Other Ambulatory Visit: Payer: Self-pay | Admitting: General Practice

## 2013-12-11 ENCOUNTER — Other Ambulatory Visit: Payer: Self-pay | Admitting: General Practice

## 2013-12-11 DIAGNOSIS — E785 Hyperlipidemia, unspecified: Secondary | ICD-10-CM

## 2013-12-11 MED ORDER — SIMVASTATIN 40 MG PO TABS: 20.0000 mg | ORAL_TABLET | Freq: Every day | ORAL | Status: DC

## 2013-12-11 MED ORDER — WARFARIN SODIUM 3 MG PO TABS: ORAL_TABLET | ORAL | Status: DC

## 2013-12-18 ENCOUNTER — Ambulatory Visit (INDEPENDENT_AMBULATORY_CARE_PROVIDER_SITE_OTHER): Payer: Commercial Managed Care - HMO | Admitting: Family Medicine

## 2013-12-18 ENCOUNTER — Encounter: Payer: Self-pay | Admitting: Family Medicine

## 2013-12-18 VITALS — BP 132/80 | HR 74 | Temp 97.9°F | Resp 16 | Wt 183.2 lb

## 2013-12-18 DIAGNOSIS — I10 Essential (primary) hypertension: Secondary | ICD-10-CM

## 2013-12-18 DIAGNOSIS — E785 Hyperlipidemia, unspecified: Secondary | ICD-10-CM

## 2013-12-18 DIAGNOSIS — E1121 Type 2 diabetes mellitus with diabetic nephropathy: Secondary | ICD-10-CM

## 2013-12-18 DIAGNOSIS — Z7901 Long term (current) use of anticoagulants: Secondary | ICD-10-CM

## 2013-12-18 DIAGNOSIS — I480 Paroxysmal atrial fibrillation: Secondary | ICD-10-CM

## 2013-12-18 LAB — BASIC METABOLIC PANEL
BUN: 38 mg/dL — ABNORMAL HIGH (ref 6–23)
CALCIUM: 10 mg/dL (ref 8.4–10.5)
CHLORIDE: 109 meq/L (ref 96–112)
CO2: 26 meq/L (ref 19–32)
Creatinine, Ser: 1.4 mg/dL — ABNORMAL HIGH (ref 0.4–1.2)
GFR: 38.85 mL/min — ABNORMAL LOW (ref >60.00)
GLUCOSE: 125 mg/dL — AB (ref 70–99)
POTASSIUM: 4.3 meq/L (ref 3.5–5.1)
SODIUM: 145 meq/L (ref 135–145)

## 2013-12-18 LAB — CBC WITH DIFFERENTIAL/PLATELET
BASOS ABS: 0 10*3/uL (ref 0.0–0.1)
Basophils Relative: 0.4 % (ref 0.0–3.0)
EOS ABS: 0.1 10*3/uL (ref 0.0–0.7)
Eosinophils Relative: 1.4 % (ref 0.0–5.0)
HCT: 45.4 % (ref 36.0–46.0)
Hemoglobin: 14.7 g/dL (ref 12.0–15.0)
Lymphocytes Relative: 16.5 % (ref 12.0–46.0)
Lymphs Abs: 1.1 10*3/uL (ref 0.7–4.0)
MCHC: 32.4 g/dL (ref 30.0–36.0)
MCV: 90.3 fl (ref 78.0–100.0)
MONOS PCT: 5.4 % (ref 3.0–12.0)
Monocytes Absolute: 0.4 10*3/uL (ref 0.1–1.0)
NEUTROS PCT: 76.3 % (ref 43.0–77.0)
Neutro Abs: 5.1 10*3/uL (ref 1.4–7.7)
Platelets: 149 10*3/uL — ABNORMAL LOW (ref 150.0–400.0)
RBC: 5.02 Mil/uL (ref 3.87–5.11)
RDW: 15.4 % (ref 11.5–15.5)
WBC: 6.6 10*3/uL (ref 4.0–10.5)

## 2013-12-18 LAB — LIPID PANEL
CHOLESTEROL: 166 mg/dL (ref 0–200)
HDL: 43.2 mg/dL (ref >39.00)
LDL Cholesterol: 99 mg/dL (ref 0–99)
NonHDL: 122.8
Total CHOL/HDL Ratio: 4
Triglycerides: 117 mg/dL (ref 0.0–149.0)
VLDL: 23.4 mg/dL (ref 0.0–40.0)

## 2013-12-18 LAB — HEPATIC FUNCTION PANEL
ALT: 21 U/L (ref 0–35)
AST: 25 U/L (ref 0–37)
Albumin: 4 g/dL (ref 3.5–5.2)
Alkaline Phosphatase: 73 U/L (ref 39–117)
BILIRUBIN DIRECT: 0.1 mg/dL (ref 0.0–0.3)
TOTAL PROTEIN: 7.9 g/dL (ref 6.0–8.3)
Total Bilirubin: 0.9 mg/dL (ref 0.2–1.2)

## 2013-12-18 LAB — HEMOGLOBIN A1C: Hgb A1c MFr Bld: 6.1 % (ref 4.6–6.5)

## 2013-12-18 LAB — POCT INR: INR: 2.3

## 2013-12-18 NOTE — Assessment & Plan Note (Signed)
INR at goal.  No med changes.  Recheck in 4 weeks

## 2013-12-18 NOTE — Progress Notes (Signed)
Pre visit review using our clinic review tool, if applicable. No additional management support is needed unless otherwise documented below in the visit note. 

## 2013-12-18 NOTE — Assessment & Plan Note (Signed)
Chronic problem.  Diet controlled.  Pt has eliminated bread from diet.  Due for eye exam- pt plans to schedule.  Check labs.  Start meds prn.

## 2013-12-18 NOTE — Progress Notes (Signed)
   Subjective:    Patient ID: [name], female    DOB: [birth year], 80 y.o.   MRN: [id number]  HPI HTN- chronic problem.  Well controlled since stopping meds.  Denies CP, SOB, HAs, visual changes, edema.  DM- chronic problem, now controlling w/ healthy diet.  Not currently on meds.  'all i have done is quit eating bread'.  Has lost 2 lbs in 3 weeks.  Due for eye exam- had one 1 year ago.  Denies numbness/tingling in hands/ feet.  Hyperlipidemia- chronic problem, on Simvastatin.  Denies abd pain, N/V, myalgias.   Review of Systems For ROS see HPI     Objective:   Physical Exam  Vitals reviewed. Constitutional: She is oriented to person, place, and time. She appears well-developed and well-nourished. No distress.  HENT:  Head: Normocephalic and atraumatic.  Eyes: Conjunctivae and EOM are normal. Pupils are equal, round, and reactive to light.  Neck: Normal range of motion. Neck supple. No thyromegaly present.  Cardiovascular: Normal rate, regular rhythm, normal heart sounds and intact distal pulses.   No murmur heard. Pulmonary/Chest: Effort normal and breath sounds normal. No respiratory distress.  Abdominal: Soft. She exhibits no distension. There is no tenderness.  Musculoskeletal: She exhibits no edema.  Lymphadenopathy:    She has no cervical adenopathy.  Neurological: She is alert and oriented to person, place, and time.  Skin: Skin is warm and dry.  Psychiatric: She has a normal mood and affect. Her behavior is normal.          Assessment & Plan:

## 2013-12-18 NOTE — Assessment & Plan Note (Signed)
Chronic problem.  Tolerating statin w/o difficulty.  Check labs.  Adjust meds prn  

## 2013-12-18 NOTE — Assessment & Plan Note (Signed)
Chronic problem.  Seeing cards.  Continues coumadin.  At goal today.  No med changes at this time.  Recheck in 1 week.

## 2013-12-18 NOTE — Patient Instructions (Signed)
Schedule your complete physical in 6 months We'll notify you of your lab results and make any changes if needed Keep up the good work on healthy diet- you look great! Call and schedule your eye exam at your convenience Call with any questions or concerns Happy Fall!!!

## 2013-12-18 NOTE — Assessment & Plan Note (Signed)
Pt w/ hx of this.  BP is actually better since stopping HCTZ.  At goal today.  No need for meds at this time.

## 2014-01-08 ENCOUNTER — Telehealth: Payer: Self-pay | Admitting: Family Medicine

## 2014-01-08 NOTE — Telephone Encounter (Signed)
Transferred pt to [facility] due to blood in her stool pt states its due to her warfarin (COUMADIN) medication.

## 2014-01-08 NOTE — Telephone Encounter (Signed)
Pt is currently at work.  Stated bleeding is only present whenever she has a bowel movement.  She just recently went to the bathroom and no blood was present.  She denied feeling weak/fatigue or short of breath more than normal.  Appointments at another office was offered, pt declined.  Saturday clinic was offered, pt declined.  An appointment with another provider here on Monday was offered, pt declined stating she only wants to see Dr. Beverely Lowabori.  Next available appointment for Dr. Beverely Lowabori was offered on Thursday, November 19 th at 1:15 pm, pt accepted.  Encouraged pt to monitor bleed, if bleeding returns or become progressively worse, she was advised to go to ER.  Pt stated understanding and agreed.

## 2014-01-08 NOTE — Telephone Encounter (Signed)
Patient Information:  Caller Name: Burna MortimerWanda  Phone: [phone]  Patient: [name], [name]  Gender: Female  DOB: [birth year]  Age: 80 Years  PCP: Sheliah Hatchabori, Katherine E.  Office Follow Up:  Does the office need to follow up with this patient?: Yes  Instructions For The Office: Please call and advise. Pt is at work at [phone] today until 2:30pm. After that she will available at [phone] from 3 pm on. Her mobile is [phone]   Symptoms  Reason For Call & Symptoms: Pt passed  bright red blood with a bm yesterday. When she wiped herself it was the toilet paper and again today.  Today it was at the end of the stool and there was enough that turned the toilet red. She is on a blood thinner. She is shaky and nervous.  Reviewed Health History In EMR: Yes  Reviewed Medications In EMR: Yes  Reviewed Allergies In EMR: Yes  Reviewed Surgeries / Procedures: Yes  Date of Onset of Symptoms: 01/07/2014  Guideline(s) Used:  Rectal Bleeding  Disposition Per Guideline:   Go to ED Now (or to Office with PCP Approval)  Reason For Disposition Reached:   Bloody, black, or tarry bowel movements  Advice Given:  N/A  RN Overrode Recommendation:  Follow Up With Office Later  Could MD please advise?

## 2014-01-08 NOTE — Telephone Encounter (Signed)
Pt needs appt for evaluation either today or at Saturday clinic if bleeding has stopped.  If bleeding continues, needs to go to ER.

## 2014-01-13 ENCOUNTER — Telehealth: Payer: Self-pay | Admitting: Family Medicine

## 2014-01-13 ENCOUNTER — Telehealth: Payer: Self-pay | Admitting: General Practice

## 2014-01-13 NOTE — Telephone Encounter (Signed)
Pt returned call back states she can come in 01/14/14 at 1pm.

## 2014-01-13 NOTE — Telephone Encounter (Signed)
appt changed

## 2014-01-13 NOTE — Telephone Encounter (Signed)
Called pt and LMOVM. Wanted to know if pt could come in at 1pm and have a 30 minute appt with Tabori.

## 2014-01-14 ENCOUNTER — Ambulatory Visit (INDEPENDENT_AMBULATORY_CARE_PROVIDER_SITE_OTHER): Payer: Commercial Managed Care - HMO | Admitting: Family Medicine

## 2014-01-14 ENCOUNTER — Ambulatory Visit: Payer: Commercial Managed Care - HMO | Admitting: Family Medicine

## 2014-01-14 ENCOUNTER — Encounter: Payer: Self-pay | Admitting: Family Medicine

## 2014-01-14 VITALS — BP 132/80 | HR 64 | Temp 98.1°F | Resp 16 | Wt 182.1 lb

## 2014-01-14 DIAGNOSIS — Z7901 Long term (current) use of anticoagulants: Secondary | ICD-10-CM

## 2014-01-14 DIAGNOSIS — K625 Hemorrhage of anus and rectum: Secondary | ICD-10-CM | POA: Insufficient documentation

## 2014-01-14 DIAGNOSIS — R251 Tremor, unspecified: Secondary | ICD-10-CM | POA: Insufficient documentation

## 2014-01-14 LAB — CBC WITH DIFFERENTIAL/PLATELET
Basophils Absolute: 0 10*3/uL (ref 0.0–0.1)
Basophils Relative: 0.4 % (ref 0.0–3.0)
EOS ABS: 0 10*3/uL (ref 0.0–0.7)
Eosinophils Relative: 0.3 % (ref 0.0–5.0)
HCT: 44.8 % (ref 36.0–46.0)
Hemoglobin: 14.4 g/dL (ref 12.0–15.0)
LYMPHS PCT: 17.2 % (ref 12.0–46.0)
Lymphs Abs: 0.9 10*3/uL (ref 0.7–4.0)
MCHC: 32.2 g/dL (ref 30.0–36.0)
MCV: 91 fl (ref 78.0–100.0)
MONOS PCT: 6.6 % (ref 3.0–12.0)
Monocytes Absolute: 0.3 10*3/uL (ref 0.1–1.0)
NEUTROS ABS: 3.9 10*3/uL (ref 1.4–7.7)
NEUTROS PCT: 75.5 % (ref 43.0–77.0)
Platelets: 147 10*3/uL — ABNORMAL LOW (ref 150.0–400.0)
RBC: 4.93 Mil/uL (ref 3.87–5.11)
RDW: 15.4 % (ref 11.5–15.5)
WBC: 5.2 10*3/uL (ref 4.0–10.5)

## 2014-01-14 LAB — BASIC METABOLIC PANEL
BUN: 29 mg/dL — ABNORMAL HIGH (ref 6–23)
CHLORIDE: 105 meq/L (ref 96–112)
CO2: 29 meq/L (ref 19–32)
CREATININE: 1.5 mg/dL — AB (ref 0.4–1.2)
Calcium: 9.8 mg/dL (ref 8.4–10.5)
GFR: 36.69 mL/min — ABNORMAL LOW (ref >60.00)
Glucose, Bld: 135 mg/dL — ABNORMAL HIGH (ref 70–99)
POTASSIUM: 4.8 meq/L (ref 3.5–5.1)
Sodium: 144 mEq/L (ref 135–145)

## 2014-01-14 LAB — POCT INR: INR: 3.1

## 2014-01-14 MED ORDER — HYDROCORTISONE ACE-PRAMOXINE 2.5-1 % RE CREA: 1.0000 "application " | TOPICAL_CREAM | Freq: Three times a day (TID) | RECTAL | Status: DC

## 2014-01-14 NOTE — Patient Instructions (Signed)
Schedule a PT check in      Weeks We'll notify you of your lab results and make any changes if needed Drink plenty of fluids OTC stool softener Use the Analpram twice daily until you no longer feel the hemorrhoid Make sure you are eating regularly- your tremor is due to low blood sugar Call with any questions or concerns Happy Thanksgiving!!!

## 2014-01-14 NOTE — Progress Notes (Signed)
   Subjective:    Patient ID: [name], female    DOB: 10-07-1929, 80 y.o.   MRN: [id number]  HPI Hemorrhoid- pt notes BRB after large BM last week.  Has not seen blood in bowl since but there has been 'tiny specks' on the paper.  Pt felt 'soft and puffy area' at rectal opening.  On coumadin.  Tremor- occurring in both hands x3 weeks.  Pt will skip breakfast, have apple/banana near noon and then dinner at 4-5pm.  Denies dizziness, leg weakness, no nausea/vomiting.  Pt recently stopped eating bread to lose weight.   PT/INR- pt due for coumadin check.   Review of Systems For ROS see HPI     Objective:   Physical Exam  Constitutional: She is oriented to person, place, and time. She appears well-developed and well-nourished. No distress.  HENT:  Head: Normocephalic and atraumatic.  Eyes: Conjunctivae and EOM are normal. Pupils are equal, round, and reactive to light.  Neck: Normal range of motion. Neck supple. No thyromegaly present.  Cardiovascular: Normal rate, regular rhythm, normal heart sounds and intact distal pulses.   No murmur heard. Pulmonary/Chest: Effort normal and breath sounds normal. No respiratory distress.  Abdominal: Soft. She exhibits no distension. There is no tenderness.  Genitourinary: Guaiac positive stool (no gross blood but heme +).  Small external hemorrhoid noted  Musculoskeletal: She exhibits no edema.  Lymphadenopathy:    She has no cervical adenopathy.  Neurological: She is alert and oriented to person, place, and time. She has normal reflexes. No cranial nerve deficit. Coordination normal.  No tremor noted  Skin: Skin is warm and dry.  Psychiatric: She has a normal mood and affect. Her behavior is normal.  Vitals reviewed.         Assessment & Plan:

## 2014-01-14 NOTE — Progress Notes (Signed)
Pre visit review using our clinic review tool, if applicable. No additional management support is needed unless otherwise documented below in the visit note. 

## 2014-01-15 ENCOUNTER — Other Ambulatory Visit: Payer: Self-pay | Admitting: Family Medicine

## 2014-01-15 ENCOUNTER — Telehealth: Payer: Self-pay | Admitting: Family Medicine

## 2014-01-15 DIAGNOSIS — N289 Disorder of kidney and ureter, unspecified: Secondary | ICD-10-CM

## 2014-01-15 NOTE — Telephone Encounter (Signed)
Pt notified of results

## 2014-01-15 NOTE — Telephone Encounter (Signed)
Pt returning your call

## 2014-01-17 NOTE — Assessment & Plan Note (Signed)
New.  No evidence on PE today.  Pt's sxs sound consistent w/ hypoglycemia- particularly since she is going through most of her day w/o eating.  Encouraged her to start eating more regularly.  Check labs to r/o electrolyte abnormality.  Will follow.

## 2014-01-17 NOTE — Assessment & Plan Note (Signed)
New.  Pt w/ small external hemorrhoid.  No gross blood, stool heme +.  Pt to monitor BMs for additional bleeding.  Check CBC.  If bleeding continues, will need GI referral.  Pt expressed understanding and is in agreement w/ plan.

## 2014-01-17 NOTE — Assessment & Plan Note (Signed)
Chronic problem.  Pt's INR just slightly high today.  Hold today's coumadin dose and resume normal dosing tomorrow, rechecking in 2 weeks.

## 2014-01-19 ENCOUNTER — Ambulatory Visit: Payer: Commercial Managed Care - HMO

## 2014-01-29 ENCOUNTER — Ambulatory Visit (INDEPENDENT_AMBULATORY_CARE_PROVIDER_SITE_OTHER): Payer: Commercial Managed Care - HMO

## 2014-01-29 VITALS — BP 130/82 | HR 64 | Temp 98.4°F | Wt 186.0 lb

## 2014-01-29 DIAGNOSIS — I4891 Unspecified atrial fibrillation: Secondary | ICD-10-CM

## 2014-01-29 LAB — POCT INR: INR: 2.1

## 2014-01-29 NOTE — Progress Notes (Signed)
Pre visit review using our clinic review tool, if applicable. No additional management support is needed unless otherwise documented below in the visit note. 

## 2014-01-29 NOTE — Patient Instructions (Signed)
Continue to take Coumadin 6 mg (2 tablets) every day except on Sat and Sun take 3 mg (1 tabletn 4 weeks.).   Recheck in 4 weeks.

## 2014-03-03 ENCOUNTER — Ambulatory Visit (INDEPENDENT_AMBULATORY_CARE_PROVIDER_SITE_OTHER): Payer: Commercial Managed Care - HMO

## 2014-03-03 VITALS — BP 162/74 | HR 69 | Temp 98.4°F | Wt 184.6 lb

## 2014-03-03 DIAGNOSIS — Z7901 Long term (current) use of anticoagulants: Secondary | ICD-10-CM

## 2014-03-03 DIAGNOSIS — I4891 Unspecified atrial fibrillation: Secondary | ICD-10-CM

## 2014-03-03 LAB — POCT INR: INR: 2.1

## 2014-03-03 NOTE — Patient Instructions (Signed)
Continue to take Coumadin 6 mg (2 tablets) every day except on Sat and Sun take 3 mg.  Recheck in 4 weeks.  

## 2014-03-23 DIAGNOSIS — N183 Chronic kidney disease, stage 3 (moderate): Secondary | ICD-10-CM | POA: Diagnosis not present

## 2014-03-23 DIAGNOSIS — E1129 Type 2 diabetes mellitus with other diabetic kidney complication: Secondary | ICD-10-CM | POA: Diagnosis not present

## 2014-03-23 DIAGNOSIS — I10 Essential (primary) hypertension: Secondary | ICD-10-CM | POA: Diagnosis not present

## 2014-03-24 ENCOUNTER — Other Ambulatory Visit: Payer: Self-pay | Admitting: Nephrology

## 2014-03-24 DIAGNOSIS — N183 Chronic kidney disease, stage 3 unspecified: Secondary | ICD-10-CM

## 2014-03-24 DIAGNOSIS — I10 Essential (primary) hypertension: Secondary | ICD-10-CM

## 2014-03-25 ENCOUNTER — Ambulatory Visit
Admission: RE | Admit: 2014-03-25 | Discharge: 2014-03-25 | Disposition: A | Discharge status: Home / Self Care | Payer: Commercial Managed Care - HMO | Source: Ambulatory Visit | Attending: Nephrology | Admitting: Nephrology

## 2014-03-25 DIAGNOSIS — I10 Essential (primary) hypertension: Secondary | ICD-10-CM | POA: Diagnosis not present

## 2014-03-25 DIAGNOSIS — N189 Chronic kidney disease, unspecified: Secondary | ICD-10-CM | POA: Diagnosis not present

## 2014-03-25 DIAGNOSIS — N183 Chronic kidney disease, stage 3 unspecified: Secondary | ICD-10-CM

## 2014-03-31 ENCOUNTER — Ambulatory Visit: Payer: Commercial Managed Care - HMO

## 2014-04-01 ENCOUNTER — Ambulatory Visit (INDEPENDENT_AMBULATORY_CARE_PROVIDER_SITE_OTHER): Payer: Commercial Managed Care - HMO | Admitting: *Deleted

## 2014-04-01 VITALS — BP 172/81 | HR 73 | Temp 97.9°F | Resp 16 | Wt 187.2 lb

## 2014-04-01 DIAGNOSIS — I4891 Unspecified atrial fibrillation: Secondary | ICD-10-CM

## 2014-04-01 LAB — POCT INR: INR: 2.7

## 2014-04-01 NOTE — Patient Instructions (Signed)
Continue to take Coumadin 6 mg (2 tablets) every day except on Sat and Sun take 3 mg.   Recheck in 4 weeks.

## 2014-04-01 NOTE — Progress Notes (Signed)
Pre visit review using our clinic review tool, if applicable. No additional management support is needed unless otherwise documented below in the visit note. 

## 2014-04-19 ENCOUNTER — Ambulatory Visit: Payer: Commercial Managed Care - HMO | Admitting: Medical

## 2014-04-26 DIAGNOSIS — I10 Essential (primary) hypertension: Secondary | ICD-10-CM | POA: Diagnosis not present

## 2014-04-26 DIAGNOSIS — N183 Chronic kidney disease, stage 3 (moderate): Secondary | ICD-10-CM | POA: Diagnosis not present

## 2014-04-29 ENCOUNTER — Ambulatory Visit (INDEPENDENT_AMBULATORY_CARE_PROVIDER_SITE_OTHER): Payer: Commercial Managed Care - HMO | Admitting: *Deleted

## 2014-04-29 VITALS — BP 148/82 | HR 60 | Temp 98.3°F | Resp 16 | Wt 187.6 lb

## 2014-04-29 DIAGNOSIS — I4891 Unspecified atrial fibrillation: Secondary | ICD-10-CM

## 2014-04-29 LAB — POCT INR: INR: 3.3

## 2014-04-29 NOTE — Progress Notes (Signed)
Pre visit review using our clinic review tool, if applicable. No additional management support is needed unless otherwise documented below in the visit note. 

## 2014-04-29 NOTE — Patient Instructions (Signed)
Continue to take Coumadin 6 mg (2 tablets) every day except on Sat and Sun take 3 mg.   Start eating some brussel sprouts.  Recheck in 2 weeks.

## 2014-05-10 DIAGNOSIS — I10 Essential (primary) hypertension: Secondary | ICD-10-CM | POA: Diagnosis not present

## 2014-05-11 ENCOUNTER — Other Ambulatory Visit: Payer: Self-pay | Admitting: Family Medicine

## 2014-05-11 NOTE — Telephone Encounter (Signed)
Med filled.  

## 2014-05-13 ENCOUNTER — Ambulatory Visit (INDEPENDENT_AMBULATORY_CARE_PROVIDER_SITE_OTHER): Payer: Commercial Managed Care - HMO | Admitting: *Deleted

## 2014-05-13 VITALS — BP 136/64 | HR 60 | Temp 98.4°F | Resp 16 | Wt 184.2 lb

## 2014-05-13 DIAGNOSIS — I4891 Unspecified atrial fibrillation: Secondary | ICD-10-CM | POA: Diagnosis not present

## 2014-05-13 LAB — POCT INR: INR: 2.2

## 2014-05-13 NOTE — Patient Instructions (Signed)
Continue to take Coumadin 6 mg (2 tablets) every day except on Sat and Sun take 3 mg.  Recheck in 2 weeks.

## 2014-05-13 NOTE — Progress Notes (Signed)
Pre visit review using our clinic review tool, if applicable. No additional management support is needed unless otherwise documented below in the visit note. 

## 2014-05-20 ENCOUNTER — Encounter: Payer: Self-pay | Admitting: Family Medicine

## 2014-05-27 ENCOUNTER — Ambulatory Visit: Payer: Commercial Managed Care - HMO

## 2014-05-27 DIAGNOSIS — N183 Chronic kidney disease, stage 3 (moderate): Secondary | ICD-10-CM | POA: Diagnosis not present

## 2014-05-27 DIAGNOSIS — I10 Essential (primary) hypertension: Secondary | ICD-10-CM | POA: Diagnosis not present

## 2014-05-28 ENCOUNTER — Ambulatory Visit (INDEPENDENT_AMBULATORY_CARE_PROVIDER_SITE_OTHER): Payer: Commercial Managed Care - HMO | Admitting: *Deleted

## 2014-05-28 VITALS — BP 120/64 | HR 62 | Temp 98.2°F | Resp 16 | Wt 185.0 lb

## 2014-05-28 DIAGNOSIS — I4891 Unspecified atrial fibrillation: Secondary | ICD-10-CM

## 2014-05-28 LAB — POCT INR: INR: 2.5

## 2014-05-28 NOTE — Patient Instructions (Signed)
Continue to take Coumadin 6 mg (2 tablets) every day except on Sat and Sun take 3 mg.  Recheck in 4 weeks.

## 2014-05-28 NOTE — Progress Notes (Signed)
Pre visit review using our clinic review tool, if applicable. No additional management support is needed unless otherwise documented below in the visit note. 

## 2014-06-01 ENCOUNTER — Telehealth: Payer: Self-pay | Admitting: Family Medicine

## 2014-06-01 NOTE — Telephone Encounter (Signed)
Pre Visit letter sent  °

## 2014-06-21 ENCOUNTER — Encounter: Payer: Commercial Managed Care - HMO | Admitting: Family Medicine

## 2014-06-25 ENCOUNTER — Ambulatory Visit (INDEPENDENT_AMBULATORY_CARE_PROVIDER_SITE_OTHER): Payer: Commercial Managed Care - HMO | Admitting: *Deleted

## 2014-06-25 VITALS — BP 142/66 | HR 62 | Temp 98.4°F | Resp 16 | Wt 187.2 lb

## 2014-06-25 DIAGNOSIS — I4891 Unspecified atrial fibrillation: Secondary | ICD-10-CM

## 2014-06-25 LAB — POCT INR: INR: 2.4

## 2014-06-25 NOTE — Patient Instructions (Signed)
Continue to take Coumadin 6 mg (2 tablets) every day except on Sat and Sun take 3 mg.  Recheck in 4 weeks.

## 2014-06-25 NOTE — Progress Notes (Signed)
Pre visit review using our clinic review tool, if applicable. No additional management support is needed unless otherwise documented below in the visit note.  Patient's left leg is more swollen than the right.  Patient states it is not painful (other than normal stiffness in bilateral legs during ambulation).  There is no redness.  Notified Dr. Beverely Lowabori who stated that patient has history of surgery on this leg and with weather changes swelling could manifest in this extremity.  Per Dr. Beverely Lowabori, notified patient to elevate leg, go to ED if swelling worsens or becomes painful, call office to be seen if swelling does not improve.  Patient stated understanding and agreed.

## 2014-07-21 ENCOUNTER — Other Ambulatory Visit: Payer: Self-pay | Admitting: Nephrology

## 2014-07-21 DIAGNOSIS — I4891 Unspecified atrial fibrillation: Secondary | ICD-10-CM | POA: Diagnosis not present

## 2014-07-21 DIAGNOSIS — N183 Chronic kidney disease, stage 3 (moderate): Secondary | ICD-10-CM | POA: Diagnosis not present

## 2014-07-21 DIAGNOSIS — R609 Edema, unspecified: Secondary | ICD-10-CM

## 2014-07-21 DIAGNOSIS — I10 Essential (primary) hypertension: Secondary | ICD-10-CM | POA: Diagnosis not present

## 2014-07-22 ENCOUNTER — Other Ambulatory Visit: Payer: Commercial Managed Care - HMO

## 2014-07-23 ENCOUNTER — Ambulatory Visit
Admission: RE | Admit: 2014-07-23 | Discharge: 2014-07-23 | Disposition: A | Discharge status: Home / Self Care | Payer: Commercial Managed Care - HMO | Source: Ambulatory Visit | Attending: Nephrology | Admitting: Nephrology

## 2014-07-23 ENCOUNTER — Ambulatory Visit: Payer: Commercial Managed Care - HMO

## 2014-07-23 DIAGNOSIS — M7989 Other specified soft tissue disorders: Secondary | ICD-10-CM | POA: Diagnosis not present

## 2014-07-23 DIAGNOSIS — R609 Edema, unspecified: Secondary | ICD-10-CM

## 2014-07-23 DIAGNOSIS — M79605 Pain in left leg: Secondary | ICD-10-CM | POA: Diagnosis not present

## 2014-08-19 ENCOUNTER — Telehealth: Payer: Self-pay | Admitting: Family Medicine

## 2014-08-19 NOTE — Telephone Encounter (Signed)
pre visit letter mailed 08/19/14 °

## 2014-08-24 ENCOUNTER — Other Ambulatory Visit: Payer: Self-pay | Admitting: Family Medicine

## 2014-08-24 NOTE — Telephone Encounter (Signed)
Med filled.  

## 2014-09-08 ENCOUNTER — Telehealth: Payer: Self-pay | Admitting: *Deleted

## 2014-09-08 NOTE — Telephone Encounter (Signed)
Appointment confirmed with patient.

## 2014-09-09 ENCOUNTER — Encounter: Payer: Self-pay | Admitting: Family Medicine

## 2014-09-09 ENCOUNTER — Ambulatory Visit (INDEPENDENT_AMBULATORY_CARE_PROVIDER_SITE_OTHER): Payer: Commercial Managed Care - HMO | Admitting: Family Medicine

## 2014-09-09 VITALS — BP 132/64 | HR 52 | Temp 97.6°F | Resp 18 | Ht 60.0 in | Wt 173.4 lb

## 2014-09-09 DIAGNOSIS — E1121 Type 2 diabetes mellitus with diabetic nephropathy: Secondary | ICD-10-CM

## 2014-09-09 DIAGNOSIS — Z0000 Encounter for general adult medical examination without abnormal findings: Secondary | ICD-10-CM

## 2014-09-09 DIAGNOSIS — I480 Paroxysmal atrial fibrillation: Secondary | ICD-10-CM | POA: Diagnosis not present

## 2014-09-09 DIAGNOSIS — M8580 Other specified disorders of bone density and structure, unspecified site: Secondary | ICD-10-CM | POA: Diagnosis not present

## 2014-09-09 DIAGNOSIS — E785 Hyperlipidemia, unspecified: Secondary | ICD-10-CM

## 2014-09-09 DIAGNOSIS — I10 Essential (primary) hypertension: Secondary | ICD-10-CM | POA: Diagnosis not present

## 2014-09-09 LAB — HEPATIC FUNCTION PANEL
ALBUMIN: 4.2 g/dL (ref 3.5–5.2)
ALK PHOS: 61 U/L (ref 39–117)
ALT: 21 U/L (ref 0–35)
AST: 21 U/L (ref 0–37)
BILIRUBIN DIRECT: 0.2 mg/dL (ref 0.0–0.3)
TOTAL PROTEIN: 7 g/dL (ref 6.0–8.3)
Total Bilirubin: 0.7 mg/dL (ref 0.2–1.2)

## 2014-09-09 LAB — LIPID PANEL
CHOL/HDL RATIO: 3
Cholesterol: 141 mg/dL (ref 0–200)
HDL: 40.5 mg/dL (ref >39.00)
LDL Cholesterol: 83 mg/dL (ref 0–99)
NonHDL: 100.5
Triglycerides: 88 mg/dL (ref 0.0–149.0)
VLDL: 17.6 mg/dL (ref 0.0–40.0)

## 2014-09-09 LAB — CBC WITH DIFFERENTIAL/PLATELET
Basophils Absolute: 0 10*3/uL (ref 0.0–0.1)
Basophils Relative: 0.4 % (ref 0.0–3.0)
EOS ABS: 0.1 10*3/uL (ref 0.0–0.7)
Eosinophils Relative: 1 % (ref 0.0–5.0)
HCT: 42.8 % (ref 36.0–46.0)
Hemoglobin: 14.1 g/dL (ref 12.0–15.0)
LYMPHS ABS: 1 10*3/uL (ref 0.7–4.0)
Lymphocytes Relative: 17.1 % (ref 12.0–46.0)
MCHC: 33.1 g/dL (ref 30.0–36.0)
MCV: 85.4 fl (ref 78.0–100.0)
MONOS PCT: 8.4 % (ref 3.0–12.0)
Monocytes Absolute: 0.5 10*3/uL (ref 0.1–1.0)
Neutro Abs: 4.1 10*3/uL (ref 1.4–7.7)
Neutrophils Relative %: 73.1 % (ref 43.0–77.0)
PLATELETS: 149 10*3/uL — AB (ref 150.0–400.0)
RBC: 5.01 Mil/uL (ref 3.87–5.11)
RDW: 17.1 % — AB (ref 11.5–15.5)
WBC: 5.6 10*3/uL (ref 4.0–10.5)

## 2014-09-09 LAB — BASIC METABOLIC PANEL
BUN: 44 mg/dL — AB (ref 6–23)
CO2: 29 mEq/L (ref 19–32)
CREATININE: 1.31 mg/dL — AB (ref 0.40–1.20)
Calcium: 9.6 mg/dL (ref 8.4–10.5)
Chloride: 104 mEq/L (ref 96–112)
GFR: 41.18 mL/min — ABNORMAL LOW (ref >60.00)
GLUCOSE: 108 mg/dL — AB (ref 70–99)
Potassium: 3.9 mEq/L (ref 3.5–5.1)
SODIUM: 142 meq/L (ref 135–145)

## 2014-09-09 LAB — HEMOGLOBIN A1C: Hgb A1c MFr Bld: 6.2 % (ref 4.6–6.5)

## 2014-09-09 LAB — MICROALBUMIN / CREATININE URINE RATIO
CREATININE, U: 132.6 mg/dL
MICROALB/CREAT RATIO: 0.6 mg/g (ref 0.0–30.0)
Microalb, Ur: 0.8 mg/dL (ref 0.0–1.9)

## 2014-09-09 LAB — PROTIME-INR
INR: 2.3 ratio — AB (ref 0.8–1.0)
Prothrombin Time: 24.6 s — ABNORMAL HIGH (ref 9.6–13.1)

## 2014-09-09 LAB — TSH: TSH: 3.48 u[IU]/mL (ref 0.35–4.50)

## 2014-09-09 LAB — VITAMIN D 25 HYDROXY (VIT D DEFICIENCY, FRACTURES): VITD: 53.8 ng/mL (ref 30.00–100.00)

## 2014-09-09 NOTE — Progress Notes (Signed)
Pre visit review using our clinic review tool, if applicable. No additional management support is needed unless otherwise documented below in the visit note. 

## 2014-09-09 NOTE — Progress Notes (Signed)
   Subjective:    Patient ID: [name], female    DOB: Dec 06, 1929, 81 y.o.   MRN: [id number]  HPI Here today for CPE.  Risk Factors: Afib- chronic problem, following w/ Cards (Dr Ladona Ridgelaylor).  Asymptomatic.  On Coumadin HTN- chronic problem, on Lasix, HCTZ, amlodipine.  Well controlled today.  Denies CP, SOB, HAs, visual changes. Hyperlipidemia- chronic problem, on Simvastatin.  Denies abd pain, N/V. DM- chronic problem, controlled w/ diet and cinnamon.  Pt has lost 15 lbs by 'i quit eating bread'.  Due for eye exam- last done 2 yrs ago. Osteopenia- UTD on DEXA.  Physical Activity: limited due to chronic leg pain Fall Risk: moderate- pt uses cane for ambulation Depression: denies current sxs Hearing: decreased to conversational tones and whispered voice ADL's: independent Cognitive: normal linear thought process, memory and attention intact Home Safety: safe at home Height, Weight, BMI, Visual Acuity: see vitals, vision corrected to 20/20 w/ glasses Counseling: Due for mammo- [facility] called pt but she doesn't have the money, refusing colonoscopy.  Declines all vaccines- 'no shots'. Labs Ordered: See A&P Care Plan: See A&P    Review of Systems Patient reports no vision/ hearing changes, adenopathy,fever, weight change,  persistant/recurrent hoarseness , swallowing issues, chest pain, palpitations, edema, persistant/recurrent cough, hemoptysis, dyspnea (rest/exertional/paroxysmal nocturnal), gastrointestinal bleeding (melena, rectal bleeding), abdominal pain, significant heartburn, bowel changes, GU symptoms (dysuria, hematuria, incontinence), Gyn symptoms (abnormal  bleeding, pain),  syncope, focal weakness, memory loss, numbness & tingling, skin/hair/nail changes, abnormal bruising or bleeding, anxiety, or depression.     Objective:   Physical Exam General Appearance:    Alert, cooperative, no distress, appears stated age  Head:    Normocephalic, without obvious abnormality, atraumatic   Eyes:    PERRL, conjunctiva/corneas clear, EOM's intact, fundi    benign, both eyes  Ears:    Normal TM's and external ear canals, both ears  Nose:   Nares normal, septum midline, mucosa normal, no drainage    or sinus tenderness  Throat:   Lips, mucosa, and tongue normal; teeth and gums normal  Neck:   Supple, symmetrical, trachea midline, no adenopathy;    Thyroid: no enlargement/tenderness/nodules  Back:     Symmetric, no curvature, ROM normal, no CVA tenderness  Lungs:     Clear to auscultation bilaterally, respirations unlabored  Chest Wall:    No tenderness or deformity   Heart:    Regular rate and rhythm, S1 and S2 normal, no murmur, rub   or gallop  Breast Exam:    Deferred to GYN  Abdomen:     Soft, non-tender, bowel sounds active all four quadrants,    no masses, no organomegaly  Genitalia:    Deferred to GYN  Rectal:    Extremities:   Extremities normal, atraumatic, no cyanosis or edema  Pulses:   2+ and symmetric all extremities  Skin:   Skin color, texture, turgor normal, no rashes or lesions  Lymph nodes:   Cervical, supraclavicular, and axillary nodes normal  Neurologic:   CNII-XII intact, normal strength, sensation and reflexes    throughout          Assessment & Plan:

## 2014-09-09 NOTE — Patient Instructions (Signed)
Follow up in 6 months to recheck BP and cholesterol We'll notify you of your lab results and make any changes if needed (including when to follow up for your coumadin check) Keep up the good work!  You look great! Please schedule your mammogram when you are able Call with any questions or concerns Have a great summer!!!

## 2014-09-12 NOTE — Assessment & Plan Note (Signed)
Chronic problem.  On anticoagulation w/ coumadin.  Check PT/INR today.  Adjust meds prn.  Currently reg S1/S2, no reported palpitations.  Rate controlled.  Will continue to follow.

## 2014-09-12 NOTE — Assessment & Plan Note (Signed)
Pt's PE unchanged from previous.  Due for colonoscopy but pt refuses.  Also refuses all vaccines.  Due for mammo- pt aware but doesn't currently have the money for this.  Plans to schedule.  Check labs.  Anticipatory guidance provided.

## 2014-09-12 NOTE — Assessment & Plan Note (Signed)
Check Vit D, replete prn.

## 2014-09-12 NOTE — Assessment & Plan Note (Signed)
Chronic problem.  On Simvastatin w/o difficulty.  Check labs.  Adjust meds prn  

## 2014-09-12 NOTE — Assessment & Plan Note (Addendum)
Chronic problem.  Well controlled on HCTZ, amlodipine, Lasix.  Asymptomatic.  Check labs.  No anticipated med changes.

## 2014-09-12 NOTE — Assessment & Plan Note (Signed)
Diet controlled.  Pt has recently stopped eating bread and has lost 15 lbs.  Due for eye exam- pt plans to schedule when she has the money.  Not on ACE/ARB due to renal insufficiency.  Check labs.  Initiate medication prn.

## 2014-09-22 DIAGNOSIS — I10 Essential (primary) hypertension: Secondary | ICD-10-CM | POA: Diagnosis not present

## 2014-09-22 DIAGNOSIS — N183 Chronic kidney disease, stage 3 (moderate): Secondary | ICD-10-CM | POA: Diagnosis not present

## 2014-12-14 DIAGNOSIS — N183 Chronic kidney disease, stage 3 (moderate): Secondary | ICD-10-CM | POA: Diagnosis not present

## 2014-12-14 DIAGNOSIS — I10 Essential (primary) hypertension: Secondary | ICD-10-CM | POA: Diagnosis not present

## 2014-12-28 ENCOUNTER — Telehealth: Payer: Self-pay | Admitting: Family Medicine

## 2014-12-28 ENCOUNTER — Other Ambulatory Visit: Payer: Self-pay | Admitting: Family Medicine

## 2014-12-28 NOTE — Telephone Encounter (Signed)
Medication filled to pharmacy as requested.   

## 2014-12-28 NOTE — Telephone Encounter (Signed)
Caller name: Burna MortimerWanda   Relationship to patient: Self   Can be reached: [phone]  Pharmacy: [facility] [address], [place] - [id number]  Reason for call: pt need a refill on her simvastatin Rx.

## 2014-12-29 NOTE — Telephone Encounter (Signed)
This medication was filled on 11/1

## 2014-12-29 NOTE — Telephone Encounter (Signed)
LVM informing pt that medication is ready for pick up

## 2015-01-11 ENCOUNTER — Telehealth: Payer: Self-pay | Admitting: Family Medicine

## 2015-01-11 NOTE — Telephone Encounter (Signed)
Patient states at end of the month she saw kidney Dr. And her Potassium was low. Patient states she would like to have labwork done in this office. States she needs to re-start her Coumadin/INR checks. States she takes Warfarin 6 mg qhs. Patient states an appointment has already been scheduled for her to see provider tomorrow.

## 2015-01-11 NOTE — Telephone Encounter (Signed)
Pt called in stating concern about not being contacted for coumadin checks. She said she was to be coming in every 2 weeks. She said that she needs a check on her A1C (sugars) and potassium. Pt said that she was told by another doctor she has low potassium and to take a supplement. She said she is shaking and was told by other people it is because of her sugars so she ate raisins. Pt is concerned and wanting call back from Dr. Beverely Lowabori or nurse.  Best # [phone]

## 2015-01-11 NOTE — Telephone Encounter (Signed)
Left message for return call to discuss issues patient mentioned

## 2015-01-12 ENCOUNTER — Ambulatory Visit (INDEPENDENT_AMBULATORY_CARE_PROVIDER_SITE_OTHER): Payer: Commercial Managed Care - HMO | Admitting: Family Medicine

## 2015-01-12 ENCOUNTER — Encounter: Payer: Self-pay | Admitting: Family Medicine

## 2015-01-12 VITALS — BP 142/80 | HR 62 | Temp 98.0°F | Resp 16 | Ht 60.0 in | Wt 178.1 lb

## 2015-01-12 DIAGNOSIS — I10 Essential (primary) hypertension: Secondary | ICD-10-CM

## 2015-01-12 DIAGNOSIS — R251 Tremor, unspecified: Secondary | ICD-10-CM | POA: Diagnosis not present

## 2015-01-12 DIAGNOSIS — E1121 Type 2 diabetes mellitus with diabetic nephropathy: Secondary | ICD-10-CM

## 2015-01-12 MED ORDER — PROPRANOLOL HCL 20 MG PO TABS: 20.0000 mg | ORAL_TABLET | Freq: Two times a day (BID) | ORAL | Status: DC

## 2015-01-12 NOTE — Assessment & Plan Note (Signed)
Chronic problem.  Pt has been <6.5 w/ healthy diet.  Stressed that her tremor was not likely related to her blood sugars but we would certainly check.  Will continue to follow.

## 2015-01-12 NOTE — Assessment & Plan Note (Signed)
Ongoing issue for pt.  She is only on Hydralazine, Lasix- no longer on Amlodipine due to swelling or HCTZ.  Due to mildly elevated BP in the setting of tremor, will start low dose Beta blocker.  Cautioned pt on sxs of orthostatic hypotension given low HR.  Will follow closely.

## 2015-01-12 NOTE — Patient Instructions (Signed)
Follow up in 1 month to recheck BP and tremor We'll notify you of your lab results and make any changes if needed START the Propranolol twice daily (in addition to your Hydralazine and Lasix) Drink plenty of fluids Rest as needed Call with any questions or concerns If you want to join us at the new [place] office, any scheduled appointments will automatically transfer and we will see you at [address], [place], [place] [id number] (OPENING 03/01/15) Move your physical appt to a time that's more convenient for you Happy Thanksgiving!

## 2015-01-12 NOTE — Progress Notes (Signed)
Pre visit review using our clinic review tool, if applicable. No additional management support is needed unless otherwise documented below in the visit note. 

## 2015-01-12 NOTE — Assessment & Plan Note (Signed)
Ongoing issue for pt but she states this is worsening.  She has a high amplitude tremor of hands bilaterally w/ purposeful movement.  Suspect an intention tremor.  Will start low dose propranolol and monitor for improvement as pt does not want to see neuro at this time.  Pt expressed understanding and is in agreement w/ plan.

## 2015-01-12 NOTE — Progress Notes (Signed)
   Subjective:    Patient ID: [name], female    DOB: December 11, 1929, 81 y.o.   MRN: [id number]  HPI HTN- chronic problem, currently on hydralazine and lasix.  BP at [facility] on 11/7 was 140/60.  Denies CP, SOB, HAs, visual changes.  K+ was 3.3 on 11/7- has been eating daily banana, raisins.  Shaking- pt reports shaking w/ activity.  Shaking improves when she eats raisins. No shaking when she is sitting still.  Shaking means tremor of arms bilaterally, L>R.  Shaking occurs w/ carrying objects, attempting to write.  Pt has hx of tremor   Review of Systems For ROS see HPI     Objective:   Physical Exam  Constitutional: She is oriented to person, place, and time. She appears well-developed and well-nourished. No distress.  HENT:  Head: Normocephalic and atraumatic.  Eyes: Conjunctivae and EOM are normal. Pupils are equal, round, and reactive to light.  Neck: Normal range of motion. Neck supple. No thyromegaly present.  Cardiovascular: Normal rate, normal heart sounds and intact distal pulses.   No murmur heard. Irregular S1/S2  Pulmonary/Chest: Effort normal and breath sounds normal. No respiratory distress.  Abdominal: Soft. She exhibits no distension. There is no tenderness.  Musculoskeletal: She exhibits no edema.  Lymphadenopathy:    She has no cervical adenopathy.  Neurological: She is alert and oriented to person, place, and time. She has normal reflexes.  Bilateral high amplitude intention tremor of hands  Skin: Skin is warm and dry.  Psychiatric: She has a normal mood and affect. Her behavior is normal.  Vitals reviewed.         Assessment & Plan:

## 2015-01-13 LAB — BASIC METABOLIC PANEL
BUN: 37 mg/dL — AB (ref 6–23)
CO2: 30 mEq/L (ref 19–32)
CREATININE: 1.47 mg/dL — AB (ref 0.40–1.20)
Calcium: 9.7 mg/dL (ref 8.4–10.5)
Chloride: 101 mEq/L (ref 96–112)
GFR: 36.02 mL/min — AB (ref >60.00)
GLUCOSE: 90 mg/dL (ref 70–99)
POTASSIUM: 4.6 meq/L (ref 3.5–5.1)
Sodium: 143 mEq/L (ref 135–145)

## 2015-01-13 LAB — HEMOGLOBIN A1C: Hgb A1c MFr Bld: 6.1 % (ref 4.6–6.5)

## 2015-02-09 ENCOUNTER — Other Ambulatory Visit: Payer: Self-pay | Admitting: Family Medicine

## 2015-02-09 NOTE — Telephone Encounter (Signed)
Medication filled to pharmacy as requested.   

## 2015-02-11 ENCOUNTER — Ambulatory Visit (INDEPENDENT_AMBULATORY_CARE_PROVIDER_SITE_OTHER): Payer: Commercial Managed Care - HMO | Admitting: Family Medicine

## 2015-02-11 ENCOUNTER — Encounter: Payer: Self-pay | Admitting: Family Medicine

## 2015-02-11 VITALS — BP 138/78 | HR 86 | Temp 97.9°F | Resp 16 | Ht 60.0 in | Wt 170.4 lb

## 2015-02-11 DIAGNOSIS — R251 Tremor, unspecified: Secondary | ICD-10-CM | POA: Diagnosis not present

## 2015-02-11 DIAGNOSIS — I10 Essential (primary) hypertension: Secondary | ICD-10-CM

## 2015-02-11 NOTE — Patient Instructions (Signed)
Change your physical to an afternoon appt since this is more convenient for you Continue your current medications- no changes at this time Keep up the good work!  You look great! Call with any questions or concerns Hang in there! Happy Holidays!!!

## 2015-02-11 NOTE — Progress Notes (Signed)
Pre visit review using our clinic review tool, if applicable. No additional management support is needed unless otherwise documented below in the visit note. 

## 2015-02-11 NOTE — Progress Notes (Signed)
   Subjective:    Patient ID: [name], female    DOB: Jan 16, 1930, 81 y.o.   MRN: [id number]  HPI HTN- chronic problem, on Amlodipine, Lasix, Hydralazine, HCTZ.  Propranolol was added at last visit.  BP is better today.  Pt has lost 8 more lbs since last visit.  No CP.  + SOB w/ exertion.  No HAs, visual changes, edema.  Tremors- pt reports 'shaking is better' since starting the propranolol.  Pt reports shaking is always L handed when it occurs. (was previously bilateral)   Review of Systems For ROS see HPI     Objective:   Physical Exam  Constitutional: [name] is oriented to person, place, and time. [name] appears well-developed and well-nourished. No distress.  HENT:  Head: Normocephalic and atraumatic.  Eyes: Conjunctivae and EOM are normal. Pupils are equal, round, and reactive to light.  Neck: Normal range of motion. Neck supple. No thyromegaly present.  Cardiovascular: Normal rate, regular rhythm, normal heart sounds and intact distal pulses.   No murmur heard. Pulmonary/Chest: Effort normal and breath sounds normal. No respiratory distress.  Abdominal: Soft. [name] exhibits no distension. There is no tenderness.  Musculoskeletal: [name] exhibits no edema.  Lymphadenopathy:    [name] has no cervical adenopathy.  Neurological: [name] is alert and oriented to person, place, and time.  No tremor noted on PE today  Skin: Skin is warm and dry.  Psychiatric: [name] has a normal mood and affect. Her behavior is normal.  Vitals reviewed.         Assessment & Plan:

## 2015-02-14 NOTE — Assessment & Plan Note (Signed)
Improved since starting Propranolol.  No tremor noted on PE.  No side effects from beta blocker.  Continue meds.

## 2015-02-14 NOTE — Assessment & Plan Note (Signed)
Chronic problem.  Stable BP today since adding propranolol.  Asymptomatic.  No med changes at this time.  Will follow.

## 2015-03-12 ENCOUNTER — Emergency Department (HOSPITAL_COMMUNITY): Payer: Commercial Managed Care - HMO

## 2015-03-12 ENCOUNTER — Encounter (HOSPITAL_COMMUNITY): Payer: Self-pay

## 2015-03-12 ENCOUNTER — Inpatient Hospital Stay (HOSPITAL_COMMUNITY)
Admission: EM | Admit: 2015-03-12 | Discharge: 2015-03-15 | DRG: 549 | Disposition: A | Discharge status: Home Health | Payer: Commercial Managed Care - HMO | Attending: Internal Medicine | Admitting: Internal Medicine

## 2015-03-12 DIAGNOSIS — E119 Type 2 diabetes mellitus without complications: Secondary | ICD-10-CM | POA: Diagnosis not present

## 2015-03-12 DIAGNOSIS — L0889 Other specified local infections of the skin and subcutaneous tissue: Secondary | ICD-10-CM | POA: Diagnosis not present

## 2015-03-12 DIAGNOSIS — E669 Obesity, unspecified: Secondary | ICD-10-CM | POA: Diagnosis present

## 2015-03-12 DIAGNOSIS — F0390 Unspecified dementia without behavioral disturbance: Secondary | ICD-10-CM | POA: Diagnosis present

## 2015-03-12 DIAGNOSIS — Z6833 Body mass index (BMI) 33.0-33.9, adult: Secondary | ICD-10-CM | POA: Diagnosis not present

## 2015-03-12 DIAGNOSIS — N189 Chronic kidney disease, unspecified: Secondary | ICD-10-CM | POA: Diagnosis not present

## 2015-03-12 DIAGNOSIS — I130 Hypertensive heart and chronic kidney disease with heart failure and stage 1 through stage 4 chronic kidney disease, or unspecified chronic kidney disease: Secondary | ICD-10-CM | POA: Diagnosis present

## 2015-03-12 DIAGNOSIS — I5032 Chronic diastolic (congestive) heart failure: Secondary | ICD-10-CM | POA: Diagnosis not present

## 2015-03-12 DIAGNOSIS — M25531 Pain in right wrist: Secondary | ICD-10-CM | POA: Diagnosis present

## 2015-03-12 DIAGNOSIS — Z8249 Family history of ischemic heart disease and other diseases of the circulatory system: Secondary | ICD-10-CM | POA: Diagnosis not present

## 2015-03-12 DIAGNOSIS — M009 Pyogenic arthritis, unspecified: Secondary | ICD-10-CM | POA: Diagnosis not present

## 2015-03-12 DIAGNOSIS — E785 Hyperlipidemia, unspecified: Secondary | ICD-10-CM | POA: Diagnosis present

## 2015-03-12 DIAGNOSIS — J45909 Unspecified asthma, uncomplicated: Secondary | ICD-10-CM | POA: Diagnosis present

## 2015-03-12 DIAGNOSIS — E1122 Type 2 diabetes mellitus with diabetic chronic kidney disease: Secondary | ICD-10-CM | POA: Diagnosis not present

## 2015-03-12 DIAGNOSIS — Z7901 Long term (current) use of anticoagulants: Secondary | ICD-10-CM

## 2015-03-12 DIAGNOSIS — Z452 Encounter for adjustment and management of vascular access device: Secondary | ICD-10-CM | POA: Diagnosis not present

## 2015-03-12 DIAGNOSIS — I4891 Unspecified atrial fibrillation: Secondary | ICD-10-CM | POA: Diagnosis present

## 2015-03-12 DIAGNOSIS — I10 Essential (primary) hypertension: Secondary | ICD-10-CM | POA: Diagnosis present

## 2015-03-12 DIAGNOSIS — L03113 Cellulitis of right upper limb: Secondary | ICD-10-CM | POA: Diagnosis present

## 2015-03-12 DIAGNOSIS — E1121 Type 2 diabetes mellitus with diabetic nephropathy: Secondary | ICD-10-CM | POA: Diagnosis present

## 2015-03-12 DIAGNOSIS — L0390 Cellulitis, unspecified: Secondary | ICD-10-CM | POA: Diagnosis present

## 2015-03-12 DIAGNOSIS — B9689 Other specified bacterial agents as the cause of diseases classified elsewhere: Secondary | ICD-10-CM | POA: Diagnosis not present

## 2015-03-12 DIAGNOSIS — M00831 Arthritis due to other bacteria, right wrist: Secondary | ICD-10-CM | POA: Diagnosis not present

## 2015-03-12 DIAGNOSIS — K219 Gastro-esophageal reflux disease without esophagitis: Secondary | ICD-10-CM | POA: Diagnosis present

## 2015-03-12 DIAGNOSIS — I480 Paroxysmal atrial fibrillation: Secondary | ICD-10-CM | POA: Diagnosis not present

## 2015-03-12 DIAGNOSIS — N183 Chronic kidney disease, stage 3 unspecified: Secondary | ICD-10-CM | POA: Diagnosis present

## 2015-03-12 DIAGNOSIS — Z8639 Personal history of other endocrine, nutritional and metabolic disease: Secondary | ICD-10-CM | POA: Diagnosis present

## 2015-03-12 DIAGNOSIS — I129 Hypertensive chronic kidney disease with stage 1 through stage 4 chronic kidney disease, or unspecified chronic kidney disease: Secondary | ICD-10-CM | POA: Diagnosis not present

## 2015-03-12 DIAGNOSIS — Z79899 Other long term (current) drug therapy: Secondary | ICD-10-CM | POA: Diagnosis not present

## 2015-03-12 DIAGNOSIS — Z792 Long term (current) use of antibiotics: Secondary | ICD-10-CM

## 2015-03-12 DIAGNOSIS — M6289 Other specified disorders of muscle: Secondary | ICD-10-CM | POA: Diagnosis not present

## 2015-03-12 LAB — PROTIME-INR
INR: 2.23 — ABNORMAL HIGH (ref 0.00–1.49)
PROTHROMBIN TIME: 24.5 s — AB (ref 11.6–15.2)

## 2015-03-12 LAB — I-STAT CHEM 8, ED
BUN: 38 mg/dL — ABNORMAL HIGH (ref 6–20)
CALCIUM ION: 1.1 mmol/L — AB (ref 1.13–1.30)
CREATININE: 1.4 mg/dL — AB (ref 0.44–1.00)
Chloride: 103 mmol/L (ref 101–111)
GLUCOSE: 125 mg/dL — AB (ref 65–99)
HCT: 43 % (ref 36.0–46.0)
HEMOGLOBIN: 14.6 g/dL (ref 12.0–15.0)
POTASSIUM: 4 mmol/L (ref 3.5–5.1)
Sodium: 143 mmol/L (ref 135–145)
TCO2: 26 mmol/L (ref 0–100)

## 2015-03-12 LAB — CBC WITH DIFFERENTIAL/PLATELET
BASOS ABS: 0 10*3/uL (ref 0.0–0.1)
Basophils Relative: 0 %
EOS PCT: 0 %
Eosinophils Absolute: 0 10*3/uL (ref 0.0–0.7)
HEMATOCRIT: 42 % (ref 36.0–46.0)
Hemoglobin: 13.7 g/dL (ref 12.0–15.0)
LYMPHS PCT: 12 %
Lymphs Abs: 1.3 10*3/uL (ref 0.7–4.0)
MCH: 29 pg (ref 26.0–34.0)
MCHC: 32.6 g/dL (ref 30.0–36.0)
MCV: 89 fL (ref 78.0–100.0)
MONO ABS: 1.4 10*3/uL — AB (ref 0.1–1.0)
MONOS PCT: 13 %
Neutro Abs: 8.4 10*3/uL — ABNORMAL HIGH (ref 1.7–7.7)
Neutrophils Relative %: 75 %
PLATELETS: 158 10*3/uL (ref 150–400)
RBC: 4.72 MIL/uL (ref 3.87–5.11)
RDW: 15.9 % — AB (ref 11.5–15.5)
WBC: 11.2 10*3/uL — ABNORMAL HIGH (ref 4.0–10.5)

## 2015-03-12 LAB — CBG MONITORING, ED: GLUCOSE-CAPILLARY: 109 mg/dL — AB (ref 65–99)

## 2015-03-12 LAB — GLUCOSE, CAPILLARY: GLUCOSE-CAPILLARY: 168 mg/dL — AB (ref 65–99)

## 2015-03-12 MED ORDER — FUROSEMIDE 40 MG PO TABS
40.0000 mg | ORAL_TABLET | Freq: Every day | ORAL | Status: DC
  Administered 2015-03-14 – 2015-03-15 (×2): 40 mg via ORAL
  Filled 2015-03-12 (×3): qty 1

## 2015-03-12 MED ORDER — SENNA 8.6 MG PO TABS
1.0000 | ORAL_TABLET | Freq: Two times a day (BID) | ORAL | Status: DC
  Administered 2015-03-14 (×2): 8.6 mg via ORAL
  Filled 2015-03-12 (×2): qty 1

## 2015-03-12 MED ORDER — TETANUS-DIPHTH-ACELL PERTUSSIS 5-2.5-18.5 LF-MCG/0.5 IM SUSP
0.5000 mL | Freq: Once | INTRAMUSCULAR | Status: DC
  Filled 2015-03-12: qty 0.5

## 2015-03-12 MED ORDER — PHYTONADIONE 5 MG PO TABS
2.5000 mg | ORAL_TABLET | Freq: Once | ORAL | Status: AC
  Administered 2015-03-12: 2.5 mg via ORAL
  Filled 2015-03-12: qty 1

## 2015-03-12 MED ORDER — ALBUTEROL SULFATE (2.5 MG/3ML) 0.083% IN NEBU: 3.0000 mL | INHALATION_SOLUTION | RESPIRATORY_TRACT | Status: DC | PRN

## 2015-03-12 MED ORDER — TRAMADOL HCL 50 MG PO TABS
50.0000 mg | ORAL_TABLET | Freq: Four times a day (QID) | ORAL | Status: DC | PRN
Start: 2015-03-12 — End: 2015-03-16

## 2015-03-12 MED ORDER — ONDANSETRON HCL 4 MG PO TABS: 4.0000 mg | ORAL_TABLET | Freq: Four times a day (QID) | ORAL | Status: DC | PRN

## 2015-03-12 MED ORDER — ACETAMINOPHEN 325 MG PO TABS: 650.0000 mg | ORAL_TABLET | Freq: Four times a day (QID) | ORAL | Status: DC | PRN

## 2015-03-12 MED ORDER — LIDOCAINE HCL (PF) 1 % IJ SOLN
5.0000 mL | Freq: Once | INTRAMUSCULAR | Status: AC
  Administered 2015-03-12: 5 mL
  Filled 2015-03-12: qty 5

## 2015-03-12 MED ORDER — INSULIN ASPART 100 UNIT/ML ~~LOC~~ SOLN
0.0000 [IU] | SUBCUTANEOUS | Status: DC
  Administered 2015-03-13: 2 [IU] via SUBCUTANEOUS
  Administered 2015-03-13 – 2015-03-14 (×2): 1 [IU] via SUBCUTANEOUS

## 2015-03-12 MED ORDER — CLINDAMYCIN PHOSPHATE 600 MG/50ML IV SOLN
600.0000 mg | Freq: Once | INTRAVENOUS | Status: AC
  Administered 2015-03-12: 600 mg via INTRAVENOUS
  Filled 2015-03-12: qty 50

## 2015-03-12 MED ORDER — SIMVASTATIN 20 MG PO TABS
20.0000 mg | ORAL_TABLET | Freq: Every day | ORAL | Status: DC
  Administered 2015-03-12 – 2015-03-14 (×3): 20 mg via ORAL
  Filled 2015-03-12 (×4): qty 1

## 2015-03-12 MED ORDER — AMLODIPINE BESYLATE 10 MG PO TABS
10.0000 mg | ORAL_TABLET | Freq: Every day | ORAL | Status: DC
  Administered 2015-03-12 – 2015-03-14 (×2): 10 mg via ORAL
  Filled 2015-03-12 (×4): qty 1

## 2015-03-12 MED ORDER — VANCOMYCIN HCL IN DEXTROSE 1-5 GM/200ML-% IV SOLN
1000.0000 mg | INTRAVENOUS | Status: DC
  Administered 2015-03-12 – 2015-03-13 (×2): 1000 mg via INTRAVENOUS
  Filled 2015-03-12 (×2): qty 200

## 2015-03-12 MED ORDER — HYDROCODONE-ACETAMINOPHEN 5-325 MG PO TABS: 1.0000 | ORAL_TABLET | ORAL | Status: DC | PRN

## 2015-03-12 MED ORDER — HYDRALAZINE HCL 50 MG PO TABS
50.0000 mg | ORAL_TABLET | Freq: Every evening | ORAL | Status: DC
  Administered 2015-03-12: 50 mg via ORAL
  Filled 2015-03-12 (×3): qty 1

## 2015-03-12 MED ORDER — ACETAMINOPHEN 650 MG RE SUPP: 650.0000 mg | Freq: Four times a day (QID) | RECTAL | Status: DC | PRN

## 2015-03-12 MED ORDER — DOCUSATE SODIUM 100 MG PO CAPS
100.0000 mg | ORAL_CAPSULE | Freq: Two times a day (BID) | ORAL | Status: DC
  Administered 2015-03-12 – 2015-03-15 (×5): 100 mg via ORAL
  Filled 2015-03-12 (×7): qty 1

## 2015-03-12 MED ORDER — LIDOCAINE HCL 2 % IJ SOLN
INTRAMUSCULAR | Status: AC
  Administered 2015-03-12: 400 mg
  Filled 2015-03-12: qty 20

## 2015-03-12 MED ORDER — PROPRANOLOL HCL 20 MG PO TABS
20.0000 mg | ORAL_TABLET | Freq: Two times a day (BID) | ORAL | Status: DC
  Administered 2015-03-12 – 2015-03-15 (×4): 20 mg via ORAL
  Filled 2015-03-12 (×7): qty 1

## 2015-03-12 MED ORDER — ONDANSETRON HCL 4 MG/2ML IJ SOLN: 4.0000 mg | Freq: Four times a day (QID) | INTRAMUSCULAR | Status: DC | PRN

## 2015-03-12 MED ORDER — HYDRALAZINE HCL 25 MG PO TABS
25.0000 mg | ORAL_TABLET | Freq: Every morning | ORAL | Status: DC
  Administered 2015-03-14 – 2015-03-15 (×2): 25 mg via ORAL
  Filled 2015-03-12 (×3): qty 2

## 2015-03-12 MED ORDER — PIPERACILLIN-TAZOBACTAM 3.375 G IVPB
3.3750 g | Freq: Three times a day (TID) | INTRAVENOUS | Status: DC
  Administered 2015-03-12 – 2015-03-13 (×2): 3.375 g via INTRAVENOUS
  Filled 2015-03-12 (×3): qty 50

## 2015-03-12 MED ORDER — TRAMADOL HCL 50 MG PO TABS
50.0000 mg | ORAL_TABLET | Freq: Once | ORAL | Status: AC
  Administered 2015-03-12: 50 mg via ORAL
  Filled 2015-03-12: qty 1

## 2015-03-12 NOTE — Consult Note (Signed)
Reason for Consult:pain R wrist Referring Physician: ER  CC:My right hurts  HPI:  [name] is an 82 y.o. right handed female who presents with red swollen R dorsal wrist, denies trauma, denies recent infections; awoke with wrist hurting ~3days ago, has become worse, hurts to move fingers.       .   Pain is rated at   10 /10 and is described as sharp.  Pain is constant.  Pain is made better by rest/immobilization, worse with motion.   Associated signs/symptoms:redness, pain, swelling Previous treatment:    Past Medical History  Diagnosis Date  . DIABETES-TYPE 2   . Other and unspecified hyperlipidemia   . OBESITY   . HYPERTENSION   . Intrinsic asthma, unspecified   . GERD   . KNEE PAIN   . Edema   . DYSPNEA ON EXERTION   . Atrial fibrillation [facility](HCC)     Past Surgical History  Procedure Laterality Date  . Abdominal hysterectomy      Family History  Problem Relation Age of Onset  . Coronary artery disease Mother   . Hypertension Mother   . Hypertension Father   . Prostate cancer Father     Social History:  reports that she has never smoked. She does not have any smokeless tobacco history on file. She reports that she does not drink alcohol or use illicit drugs.  Allergies: No Known Allergies  Medications: I have reviewed the patient's current medications.  Results for orders placed or performed during the hospital encounter of 03/12/15 (from the past 48 hour(s))  CBG monitoring, ED     Status: Abnormal   Collection Time: 03/12/15  3:45 PM  Result Value Ref Range   Glucose-Capillary 109 (H) 65 - 99 mg/dL  CBC with Differential     Status: Abnormal   Collection Time: 03/12/15  4:16 PM  Result Value Ref Range   WBC 11.2 (H) 4.0 - 10.5 K/uL   RBC 4.72 3.87 - 5.11 MIL/uL   Hemoglobin 13.7 12.0 - 15.0 g/dL   HCT 40.942.0 81.136.0 - 91.446.0 %   MCV 89.0 78.0 - 100.0 fL   MCH 29.0 26.0 - 34.0 pg   MCHC 32.6 30.0 - 36.0 g/dL   RDW 78.215.9 (H) 95.611.5 - 21.315.5 %   Platelets 158 150 -  400 K/uL   Neutrophils Relative % 75 %   Neutro Abs 8.4 (H) 1.7 - 7.7 K/uL   Lymphocytes Relative 12 %   Lymphs Abs 1.3 0.7 - 4.0 K/uL   Monocytes Relative 13 %   Monocytes Absolute 1.4 (H) 0.1 - 1.0 K/uL   Eosinophils Relative 0 %   Eosinophils Absolute 0.0 0.0 - 0.7 K/uL   Basophils Relative 0 %   Basophils Absolute 0.0 0.0 - 0.1 K/uL  Protime-INR     Status: Abnormal   Collection Time: 03/12/15  4:16 PM  Result Value Ref Range   Prothrombin Time 24.5 (H) 11.6 - 15.2 seconds   INR 2.23 (H) 0.00 - 1.49  I-stat Chem 8, ED     Status: Abnormal   Collection Time: 03/12/15  4:25 PM  Result Value Ref Range   Sodium 143 135 - 145 mmol/L   Potassium 4.0 3.5 - 5.1 mmol/L   Chloride 103 101 - 111 mmol/L   BUN 38 (H) 6 - 20 mg/dL   Creatinine, Ser 0.861.40 (H) 0.44 - 1.00 mg/dL   Glucose, Bld 578125 (H) 65 - 99 mg/dL   Calcium, Ion 4.691.10 (  L) 1.13 - 1.30 mmol/L   TCO2 26 0 - 100 mmol/L   Hemoglobin 14.6 12.0 - 15.0 g/dL   HCT 16.1 09.6 - 04.5 %    Dg Wrist Complete Right  03/12/2015  CLINICAL DATA:  Gradually increasing RIGHT wrist pain and tenderness since Thursday, awoke with pain, no known injury, history type II diabetes mellitus, obesity, hypertension, asthma, atrial fibrillation EXAM: RIGHT WRIST - COMPLETE 3+ VIEW COMPARISON:  None FINDINGS: Bones demineralized. Degenerative changes at first [facility] joint. Minimal radiocarpal joint space narrowing. Cystic degenerative changes at lunate and triquetrum. Remain joint spaces preserved. No acute fracture, dislocation or additional bone destruction. IMPRESSION: Degenerative changes at first [facility] joint and minimally radiocarpal joint. Additional cystic degenerative changes at the lunatotriquetral joint. No other focal osseous abnormalities. Electronically Signed   By: Ulyses Southward M.D.   On: 03/12/2015 16:45    Pertinent items are noted in HPI. Temp:  [98.8 F (37.1 C)] 98.8 F (37.1 C) (01/14 1425) Pulse Rate:  [58-85] 85 (01/14 1655) Resp:  [16-18]  16 (01/14 1655) BP: (143-159)/(66-72) 159/72 mmHg (01/14 1655) SpO2:  [97 %-98 %] 97 % (01/14 1655) General appearance: alert and cooperative Resp: clear to auscultation bilaterally Cardio: irregularly irregular rhythm Extremities: R dorsal wrist warm to touch, painful to palpation pain to movement, pain with finger motion   Assessment: R wrist swelling ? Joint infection Plan: Will aspirate wrist, will need antibiotics I have discussed this treatment plan in detail with patient and family, including the risks of the recommended treatment or surgery, the benefits and the alternatives.  The patient and caregiver understands that additional treatment may be necessary.  Kella Splinter CHRISTOPHER 03/12/2015, 5:41 PM

## 2015-03-12 NOTE — Progress Notes (Signed)
ANTIBIOTIC CONSULT NOTE - INITIAL  Pharmacy Consult for vancomycin/Zosyn Indication: septic joint  No Known Allergies  Patient Measurements: Height: 5' (152.4 cm) Weight: 169 lb (76.658 kg) IBW/kg (Calculated) : 45.5 Adjusted Body Weight:   Vital Signs: Temp: 98.6 F (37 C) (01/14 2022) Temp Source: Oral (01/14 2022) BP: 156/63 mmHg (01/14 2022) Pulse Rate: 51 (01/14 2022) Intake/Output from previous day:   Intake/Output from this shift:    Labs:  Recent Labs  03/12/15 1616 03/12/15 1625  WBC 11.2*  --   HGB 13.7 14.6  PLT 158  --   CREATININE  --  1.40*   Estimated Creatinine Clearance: 27.9 mL/min (by C-G formula based on Cr of 1.4). No results for input(s): VANCOTROUGH, VANCOPEAK, VANCORANDOM, GENTTROUGH, GENTPEAK, GENTRANDOM, TOBRATROUGH, TOBRAPEAK, TOBRARND, AMIKACINPEAK, AMIKACINTROU, AMIKACIN in the last 72 hours.   Microbiology: No results found for this or any previous visit (from the past 720 hour(s)).  Medical History: Past Medical History  Diagnosis Date  . DIABETES-TYPE 2   . Other and unspecified hyperlipidemia   . OBESITY   . HYPERTENSION   . Intrinsic asthma, unspecified   . GERD   . KNEE PAIN   . Edema   . DYSPNEA ON EXERTION   . Atrial fibrillation (HCC)     Medications:  Scheduled:  . amLODipine  10 mg Oral QHS  . docusate sodium  100 mg Oral BID  . [START ON 03/13/2015] furosemide  40 mg Oral Daily  . [START ON 03/13/2015] hydrALAZINE  25-50 mg Oral q morning - 10a  . hydrALAZINE  50 mg Oral QPM  . [START ON 03/13/2015] insulin aspart  0-9 Units Subcutaneous 6 times per day  . phytonadione  2.5 mg Oral Once  . piperacillin-tazobactam (ZOSYN)  IV  3.375 g Intravenous Q8H  . propranolol  20 mg Oral BID  . senna  1 tablet Oral BID  . simvastatin  20 mg Oral QHS  . Tdap  0.5 mL Intramuscular Once  . vancomycin  1,000 mg Intravenous Q24H   Assessment: Pt is a 82 yo female who presented with 3 day history of gradually increasing  right wrist pain and swelling with some redness. In ED orthopedics consult has been called for wrist joint aspiration.  Dr.Coralee came to evaluate patient. Joint aspiration was done results currently not available. Clinically Dr. Teodoro Kiloralee suspects septic joint. Will initiate IV antibiotics and admitted to medicine if infection does not improve he'll likely need to go to OR tomorrow  ( 1/14) for washout.   1/14 >> vancomycin >> 1/14/ >> Zosyn >>     Cultures: 1/14 body fluid ( wrist):     Goal of Therapy:  Vancomycin trough level 15-20 mcg/ml  Plan:  Measure antibiotic drug levels at steady state  Zosyn 3.375 gr IV q8h EI Vancomycin 1000 mg IV q24h  Adalberto ColeNikola Lauretta Sallas, PharmD, BCPS Pager [phone] 03/12/2015 9:08 PM

## 2015-03-12 NOTE — ED Notes (Signed)
She c/o gradually increasing right hand pain and redness since Thurs.  She is in no distress.

## 2015-03-12 NOTE — ED Provider Notes (Signed)
CSN: [id number]     Arrival date & time 03/12/15  1411 History   First MD Initiated Contact with Patient 03/12/15 1501     Chief Complaint  Patient presents with  . Hand Problem     (Consider location/radiation/quality/duration/timing/severity/associated sxs/prior Treatment) HPI Comments: Patient presents with a 2 day history of worsening pain and swelling to her right wrist. She denies any known injuries. She has a history of diet controlled, borderline diabetes, hypertension and atrial for relation on Coumadin. She denies any recent injuries to the wrist. She states she has constant throbbing pain that at times shoots up to her hand and down towards her elbow. She states it feels better if she keeps it elevated. She denies any known fevers. No nausea or vomiting. She denies any history of gout or other joint disease. She does not believe that she's had a tetanus shot in the last 5 years.   Past Medical History  Diagnosis Date  . DIABETES-TYPE 2   . Other and unspecified hyperlipidemia   . OBESITY   . HYPERTENSION   . Intrinsic asthma, unspecified   . GERD   . KNEE PAIN   . Edema   . DYSPNEA ON EXERTION   . Atrial fibrillation [facility](HCC)    Past Surgical History  Procedure Laterality Date  . Abdominal hysterectomy     Family History  Problem Relation Age of Onset  . Coronary artery disease Mother   . Hypertension Mother   . Hypertension Father   . Prostate cancer Father    Social History  Substance Use Topics  . Smoking status: Never Smoker   . Smokeless tobacco: None  . Alcohol Use: No   OB History    No data available     Review of Systems  Constitutional: Negative for fever.  Gastrointestinal: Negative for nausea and vomiting.  Musculoskeletal: Positive for joint swelling and arthralgias. Negative for back pain and neck pain.  Skin: Negative for wound.  Neurological: Negative for weakness, numbness and headaches.      Allergies  Review of patient's allergies  indicates no known allergies.  Home Medications   Prior to Admission medications   Medication Sig Start Date End Date Taking? Authorizing Provider  amLODipine (NORVASC) 5 MG tablet Take 10 mg by mouth at bedtime.    Yes Historical Provider, MD  furosemide (LASIX) 40 MG tablet Take 40 mg by mouth daily.   Yes Historical Provider, MD  hydrALAZINE (APRESOLINE) 25 MG tablet Pt takes 1 tablet in the am and 2 tablets in the evening   Yes Historical Provider, MD  propranolol (INDERAL) 20 MG tablet TAKE ONE TABLET BY MOUTH TWICE DAILY 02/09/15  Yes Sheliah HatchKatherine E Tabori, MD  simvastatin (ZOCOR) 40 MG tablet TAKE ONE-HALF TABLET BY MOUTH AT BEDTIME 12/28/14  Yes Sheliah HatchKatherine E Tabori, MD  warfarin (COUMADIN) 3 MG tablet TAKE TWO TABLETS BY MOUTH ONCE DAILY 08/24/14  Yes Sheliah HatchKatherine E Tabori, MD  albuterol (VENTOLIN HFA) 108 (90 BASE) MCG/ACT inhaler INHALE TWO PUFFS EVERY 4 HOURS AS NEEDED FOR COUGH AND  WHEEZING 06/05/13   Sheliah HatchKatherine E Tabori, MD   BP 159/72 mmHg  Pulse 85  Temp(Src) 98.8 F (37.1 C) (Oral)  Resp 16  SpO2 97% Physical Exam  Constitutional: She is oriented to person, place, and time. She appears well-developed and well-nourished.  HENT:  Head: Normocephalic and atraumatic.  Neck: Normal range of motion. Neck supple.  Cardiovascular: Normal rate.   Pulmonary/Chest: Effort normal.  Musculoskeletal: She exhibits  edema and tenderness.  Positive swelling, warmth and erythema to the right wrist. There is pain on range of motion of the wrist. No wounds are noted. No rashes are noted. She has normal sensation motor function in the hand. Radial pulses are intact.  Neurological: She is alert and oriented to person, place, and time.  Skin: Skin is warm and dry.  Psychiatric: She has a normal mood and affect.    ED Course  Procedures (including critical care time) Labs Review Labs Reviewed  CBC WITH DIFFERENTIAL/PLATELET - Abnormal; Notable for the following:    WBC 11.2 (*)    RDW 15.9 (*)     Neutro Abs 8.4 (*)    Monocytes Absolute 1.4 (*)    All other components within normal limits  PROTIME-INR - Abnormal; Notable for the following:    Prothrombin Time 24.5 (*)    INR 2.23 (*)    All other components within normal limits  CBG MONITORING, ED - Abnormal; Notable for the following:    Glucose-Capillary 109 (*)    All other components within normal limits  I-STAT CHEM 8, ED - Abnormal; Notable for the following:    BUN 38 (*)    Creatinine, Ser 1.40 (*)    Glucose, Bld 125 (*)    Calcium, Ion 1.10 (*)    All other components within normal limits  BODY FLUID CULTURE  BODY FLUID CELL COUNT WITH DIFFERENTIAL    Imaging Review Dg Wrist Complete Right  03/12/2015  CLINICAL DATA:  Gradually increasing RIGHT wrist pain and tenderness since Thursday, awoke with pain, no known injury, history type II diabetes mellitus, obesity, hypertension, asthma, atrial fibrillation EXAM: RIGHT WRIST - COMPLETE 3+ VIEW COMPARISON:  None FINDINGS: Bones demineralized. Degenerative changes at first [facility] joint. Minimal radiocarpal joint space narrowing. Cystic degenerative changes at lunate and triquetrum. Remain joint spaces preserved. No acute fracture, dislocation or additional bone destruction. IMPRESSION: Degenerative changes at first [facility] joint and minimally radiocarpal joint. Additional cystic degenerative changes at the lunatotriquetral joint. No other focal osseous abnormalities. Electronically Signed   By: Ulyses Southward M.D.   On: 03/12/2015 16:45   I have personally reviewed and evaluated these images and lab results as part of my medical decision-making.   EKG Interpretation None      MDM   Final diagnoses:  Cellulitis of right upper extremity    Patient with concern for septic arthritis of the right wrist. Given that the patient is on Coumadin, I consulted with Dr. Teodoro Kil who came and evaluated the patient. He did a joint aspiration. He advised to admit the patient to the  hospitalist service for IV antibiotics. He feels that if the infection is not improving with antibiotics, he will go to the operating room tomorrow for joint washout.  I spoke with Dr. Adela Glimpse who will admit the patient to a MedSurg bed.    Rolan Bucco, MD 03/12/15 [phone]

## 2015-03-12 NOTE — H&P (Signed)
PCP:   Neena Rhymes, MD  [facility]  Referring provider Dr.Belfi   Chief Complaint: Right Wrist pain  HPI: [name] is a 82 y.o. female   has a past medical history of DIABETES-TYPE 2; Other and unspecified hyperlipidemia; OBESITY; HYPERTENSION; Intrinsic asthma, unspecified; GERD; KNEE PAIN; Edema; DYSPNEA ON EXERTION; and Atrial fibrillation (HCC).   Presented with 3 day history of gradually increasing right wrist pain and swelling with some redness. He did not injure it in anyway. Pain started in her hand and now she could not move the wrist. Patient did not endorse any fevers no nausea no vomiting no chest pain or shortness of breath. Patient denied any history of gout. Progressive pain and swelling she presented to emergency department was seen today. Blood cell, was noted to be elevated at 11.2. INR therapeutic at 2.21 Orthopedics consult has been called for wrist joint aspiration.  Dr.Coralee came to evaluate patient. Joint aspiration was done results currently not available. Clinically Dr. Teodoro Kil suspects septic joint. Will initiate IV antibiotics and admitted to medicine if infection does not improve he'll likely need to go to OR tomorrow for washout. Patient was given clindamycin in emergency department.  Patient has past history of atrial fibrillation on chronic anticoagulation rate controlled with propranolol this is followed by her primary care provider. Reports history of diet-controlled diabetes has history of chronic kidney disease baseline creatinine 1.4. Family endorses mild dementia she is able to pay bills, family cooks and shops for her.  Hospitalist was called for admission for septic joint  Review of Systems:    Pertinent positives include: chills, fatigue, right joint pain and swelling  Constitutional:  No weight loss, night sweats, Fevers,  weight loss  HEENT:  No headaches, Difficulty swallowing,Tooth/dental problems,Sore throat,  No  sneezing, itching, ear ache, nasal congestion, post nasal drip,  Cardio-vascular:  No chest pain, Orthopnea, PND, anasarca, dizziness, palpitations.no Bilateral lower extremity swelling  GI:  No heartburn, indigestion, abdominal pain, nausea, vomiting, diarrhea, change in bowel habits, loss of appetite, melena, blood in stool, hematemesis Resp:  no shortness of breath at rest. No dyspnea on exertion, No excess mucus, no productive cough, No non-productive cough, No coughing up of blood.No change in color of mucus.No wheezing. Skin:  no rash or lesions. No jaundice GU:  no dysuria, change in color of urine, no urgency or frequency. No straining to urinate.  No flank pain.  Musculoskeletal:    No back pain.  Psych:  No change in mood or affect. No depression or anxiety. No memory loss.  Neuro: no localizing neurological complaints, no tingling, no weakness, no double vision, no gait abnormality, no slurred speech, no confusion  Otherwise ROS are negative except for above, 10 systems were reviewed  Past Medical History: Past Medical History  Diagnosis Date  . DIABETES-TYPE 2   . Other and unspecified hyperlipidemia   . OBESITY   . HYPERTENSION   . Intrinsic asthma, unspecified   . GERD   . KNEE PAIN   . Edema   . DYSPNEA ON EXERTION   . Atrial fibrillation [facility])    Past Surgical History  Procedure Laterality Date  . Abdominal hysterectomy       Medications: Prior to Admission medications   Medication Sig Start Date End Date Taking? Authorizing Provider  amLODipine (NORVASC) 5 MG tablet Take 10 mg by mouth at bedtime.    Yes Historical Provider, MD  furosemide (LASIX) 40 MG tablet Take 40 mg by  mouth daily.   Yes Historical Provider, MD  hydrALAZINE (APRESOLINE) 25 MG tablet Pt takes 1 tablet in the am and 2 tablets in the evening   Yes Historical Provider, MD  propranolol (INDERAL) 20 MG tablet TAKE ONE TABLET BY MOUTH TWICE DAILY 02/09/15  Yes Sheliah Hatch, MD    simvastatin (ZOCOR) 40 MG tablet TAKE ONE-HALF TABLET BY MOUTH AT BEDTIME 12/28/14  Yes Sheliah Hatch, MD  warfarin (COUMADIN) 3 MG tablet TAKE TWO TABLETS BY MOUTH ONCE DAILY 08/24/14  Yes Sheliah Hatch, MD  albuterol (VENTOLIN HFA) 108 (90 BASE) MCG/ACT inhaler INHALE TWO PUFFS EVERY 4 HOURS AS NEEDED FOR COUGH AND  WHEEZING 06/05/13   Sheliah Hatch, MD    Allergies:  No Known Allergies  Social History:  Ambulatory independently  Or  Cane,  Lives at home alone,        reports that she has never smoked. She does not have any smokeless tobacco history on file. She reports that she does not drink alcohol or use illicit drugs.     Family History: family history includes Coronary artery disease in her mother; Hypertension in her father and mother; Prostate cancer in her father.    Physical Exam: Patient Vitals for the past 24 hrs:  BP Temp Temp src Pulse Resp SpO2  03/12/15 1655 159/72 mmHg - - 85 16 97 %  03/12/15 1425 143/66 mmHg 98.8 F (37.1 C) Oral (!) 58 18 98 %    1. General:  in No Acute distress 2. Psychological: Alert and   Oriented to self, year, situation but not President.  3. Head/ENT:    Dry Mucous Membranes                          Head Non traumatic, neck supple                          Normal  Dentition 4. SKIN: normal  Skin turgor,  Skin clean Dry and intact red inflamed right wrist 5. Heart: Regular rate and rhythm no Murmur, Rub or gallop 6. Lungs: Clear to auscultation bilaterally, no wheezes or crackles   7. Abdomen: Soft, non-tender, Non distended 8. Lower extremities: no clubbing, cyanosis, or edema 9. Neurologically Grossly intact, moving all 4 extremities equally 10. MSK: Normal range of motion  body mass index is unknown because there is no weight on file.   Labs on Admission:   Results for orders placed or performed during the hospital encounter of 03/12/15 (from the past 24 hour(s))  CBG monitoring, ED     Status: Abnormal    Collection Time: 03/12/15  3:45 PM  Result Value Ref Range   Glucose-Capillary 109 (H) 65 - 99 mg/dL  CBC with Differential     Status: Abnormal   Collection Time: 03/12/15  4:16 PM  Result Value Ref Range   WBC 11.2 (H) 4.0 - 10.5 K/uL   RBC 4.72 3.87 - 5.11 MIL/uL   Hemoglobin 13.7 12.0 - 15.0 g/dL   HCT 47.8 29.5 - 62.1 %   MCV 89.0 78.0 - 100.0 fL   MCH 29.0 26.0 - 34.0 pg   MCHC 32.6 30.0 - 36.0 g/dL   RDW 30.8 (H) 65.7 - 84.6 %   Platelets 158 150 - 400 K/uL   Neutrophils Relative % 75 %   Neutro Abs 8.4 (H) 1.7 - 7.7 K/uL   Lymphocytes  Relative 12 %   Lymphs Abs 1.3 0.7 - 4.0 K/uL   Monocytes Relative 13 %   Monocytes Absolute 1.4 (H) 0.1 - 1.0 K/uL   Eosinophils Relative 0 %   Eosinophils Absolute 0.0 0.0 - 0.7 K/uL   Basophils Relative 0 %   Basophils Absolute 0.0 0.0 - 0.1 K/uL  Protime-INR     Status: Abnormal   Collection Time: 03/12/15  4:16 PM  Result Value Ref Range   Prothrombin Time 24.5 (H) 11.6 - 15.2 seconds   INR 2.23 (H) 0.00 - 1.49  I-stat Chem 8, ED     Status: Abnormal   Collection Time: 03/12/15  4:25 PM  Result Value Ref Range   Sodium 143 135 - 145 mmol/L   Potassium 4.0 3.5 - 5.1 mmol/L   Chloride 103 101 - 111 mmol/L   BUN 38 (H) 6 - 20 mg/dL   Creatinine, Ser 4.541.40 (H) 0.44 - 1.00 mg/dL   Glucose, Bld 098125 (H) 65 - 99 mg/dL   Calcium, Ion 1.191.10 (L) 1.13 - 1.30 mmol/L   TCO2 26 0 - 100 mmol/L   Hemoglobin 14.6 12.0 - 15.0 g/dL   HCT 14.743.0 82.936.0 - 56.246.0 %    UA not obtained  Lab Results  Component Value Date   HGBA1C 6.1 01/12/2015    CrCl cannot be calculated (Unknown ideal weight.).  BNP (last 3 results) No results for input(s): PROBNP in the last 8760 hours.  Other results:  I have pearsonaly reviewed this: ECG REPORT not obtained   There were no vitals filed for this visit.   Cultures:    Component Value Date/Time   SDES URINE, CATHETERIZED 12/12/2010 1032   SDES URINE, CATHETERIZED 12/12/2010 1032   SPECREQUEST PATIENT  ON FOLLOWING LEVAQUIN 12/12/2010 1032   SPECREQUEST PATIENT ON FOLLOWING LEVAQUIN 12/12/2010 1032   CULT NO GROWTH 12/12/2010 1032   REPTSTATUS 12/13/2010 FINAL 12/12/2010 1032   REPTSTATUS 12/13/2010 FINAL 12/12/2010 1032     Radiological Exams on Admission: Dg Wrist Complete Right  03/12/2015  CLINICAL DATA:  Gradually increasing RIGHT wrist pain and tenderness since Thursday, awoke with pain, no known injury, history type II diabetes mellitus, obesity, hypertension, asthma, atrial fibrillation EXAM: RIGHT WRIST - COMPLETE 3+ VIEW COMPARISON:  None FINDINGS: Bones demineralized. Deg[facility]ative [facility]. Minimal radiocarpal joint space narrowing. Cystic degenerative changes at lunate and triquetrum. Remain joint spaces preserved. No acute fracture, dislocation or additional bone destruction. IMPRESSION: Degenerative [facility] and minimally radiocarpal joint. Additional cystic degenerative changes at the lunatotriquetral joint. No other focal osseous abnormalities. Electronically Signed   By: Ulyses SouthwardMark  Boles M.D.   On: 03/12/2015 16:45    Chart has been reviewed  Family  at  Bedside  plan of care was discussed with   Daughter Neale BurlyOrena  Murray  Assessment/Plan  82 year old female history of atrial fibrillation on Coumadin and essential hypertension diet controlled diabetes and chronic kidney disease presents with right wrist pain likely secondary to septic joint will admit for IV antibiotics with orthopedics consult and possible washout in OR in the morning  Present on Admission:  . Septic joint of right wrist (HCC) as per orthopedics may need to have washout or CHF in the morning. For now continue broad-spectrum antibiotics given septic joint will start broadly Vanco and Zosyn and once cultures available we'll change accordingly.  Because on a small amount of fluid was able to be obtained lab states they may not be obtain  cell count but will do culture  . Atrial  fibrillation (HCC) - continue propranolol discontinue Coumadin as patient will likely need intervention. Check INR in the morning given that patient may need early intervention will give 2.5 of vitamin K to ensure subtherapeutic INR by tomorrow. CHA2DS2 vasc score is 6 once Coumadin is subtherapeutic and okayed by orthopedics can initiate heparin versus Lovenox bridge to coumadin  . Essential hypertension-continue propranolol ovoid rebound tachycardia  . Type II diabetes mellitus with nephropathy (HCC)- order sliding scale  . CKD stage 3 due to type 2 diabetes mellitus (HCC)  - currently at baseline continue to monitor avoid renal toxic medications Chronic diastolic heart failure currently appears to be euvolemic avoid fluid overload, continue lasix Dyspnea patient has chronic dyspnea with exertion has been unchanged. We'll continue to monitor would recommend prior to discharge measure pulse oximetry with ambulation Dementia  - mild will monitor for worsening while hospitalized Prophylaxis: SCD  Was on coumadin will give vitamin K  CODE STATUS:  FULL CODE   as per patient    Disposition:   To home once workup is complete and patient is stable  Other plan as per orders.  I have spent a total of 65 min on this admission  Dannon Nguyenthi 03/12/2015, 8:02 PM    [facility]  Pager [phone]   after 2 AM please page floor coverage PA If 7AM-7PM, please contact the day team taking care of the patient  [email or web address]  Password [email or web address]

## 2015-03-12 NOTE — ED Notes (Signed)
Pt awaiting transport by throughput to 1512.

## 2015-03-13 ENCOUNTER — Encounter (HOSPITAL_COMMUNITY)
Admission: EM | Disposition: A | Discharge status: Home Health | Payer: Self-pay | Source: Home / Self Care | Attending: Internal Medicine

## 2015-03-13 ENCOUNTER — Inpatient Hospital Stay (HOSPITAL_COMMUNITY): Payer: Commercial Managed Care - HMO | Admitting: Anesthesiology

## 2015-03-13 DIAGNOSIS — L03113 Cellulitis of right upper limb: Secondary | ICD-10-CM | POA: Diagnosis not present

## 2015-03-13 DIAGNOSIS — I480 Paroxysmal atrial fibrillation: Secondary | ICD-10-CM

## 2015-03-13 DIAGNOSIS — N183 Chronic kidney disease, stage 3 (moderate): Secondary | ICD-10-CM

## 2015-03-13 DIAGNOSIS — E1122 Type 2 diabetes mellitus with diabetic chronic kidney disease: Secondary | ICD-10-CM

## 2015-03-13 DIAGNOSIS — Z7901 Long term (current) use of anticoagulants: Secondary | ICD-10-CM

## 2015-03-13 DIAGNOSIS — I5032 Chronic diastolic (congestive) heart failure: Secondary | ICD-10-CM

## 2015-03-13 DIAGNOSIS — E1121 Type 2 diabetes mellitus with diabetic nephropathy: Secondary | ICD-10-CM

## 2015-03-13 DIAGNOSIS — M009 Pyogenic arthritis, unspecified: Principal | ICD-10-CM

## 2015-03-13 HISTORY — PX: I&D EXTREMITY: SHX5045

## 2015-03-13 LAB — COMPREHENSIVE METABOLIC PANEL
ALBUMIN: 3.6 g/dL (ref 3.5–5.0)
ALK PHOS: 51 U/L (ref 38–126)
ALT: 14 U/L (ref 14–54)
AST: 21 U/L (ref 15–41)
Anion gap: 12 (ref 5–15)
BUN: 45 mg/dL — ABNORMAL HIGH (ref 6–20)
CALCIUM: 9.1 mg/dL (ref 8.9–10.3)
CHLORIDE: 105 mmol/L (ref 101–111)
CO2: 26 mmol/L (ref 22–32)
CREATININE: 1.67 mg/dL — AB (ref 0.44–1.00)
GFR calc Af Amer: 32 mL/min — ABNORMAL LOW (ref >60)
GFR calc non Af Amer: 27 mL/min — ABNORMAL LOW (ref >60)
GLUCOSE: 117 mg/dL — AB (ref 65–99)
Potassium: 4.1 mmol/L (ref 3.5–5.1)
SODIUM: 143 mmol/L (ref 135–145)
Total Bilirubin: 1.5 mg/dL — ABNORMAL HIGH (ref 0.3–1.2)
Total Protein: 6.7 g/dL (ref 6.5–8.1)

## 2015-03-13 LAB — TSH: TSH: 4.025 u[IU]/mL (ref 0.350–4.500)

## 2015-03-13 LAB — GLUCOSE, CAPILLARY
GLUCOSE-CAPILLARY: 113 mg/dL — AB (ref 65–99)
GLUCOSE-CAPILLARY: 140 mg/dL — AB (ref 65–99)
GLUCOSE-CAPILLARY: 141 mg/dL — AB (ref 65–99)
GLUCOSE-CAPILLARY: 158 mg/dL — AB (ref 65–99)
Glucose-Capillary: 105 mg/dL — ABNORMAL HIGH (ref 65–99)
Glucose-Capillary: 114 mg/dL — ABNORMAL HIGH (ref 65–99)
Glucose-Capillary: 116 mg/dL — ABNORMAL HIGH (ref 65–99)

## 2015-03-13 LAB — MAGNESIUM: MAGNESIUM: 2.3 mg/dL (ref 1.7–2.4)

## 2015-03-13 LAB — CBC
HCT: 37.9 % (ref 36.0–46.0)
HEMOGLOBIN: 12 g/dL (ref 12.0–15.0)
MCH: 28.4 pg (ref 26.0–34.0)
MCHC: 31.7 g/dL (ref 30.0–36.0)
MCV: 89.6 fL (ref 78.0–100.0)
Platelets: 148 10*3/uL — ABNORMAL LOW (ref 150–400)
RBC: 4.23 MIL/uL (ref 3.87–5.11)
RDW: 16.2 % — ABNORMAL HIGH (ref 11.5–15.5)
WBC: 6.6 10*3/uL (ref 4.0–10.5)

## 2015-03-13 LAB — MRSA PCR SCREENING: MRSA by PCR: NEGATIVE

## 2015-03-13 LAB — PHOSPHORUS: Phosphorus: 3.9 mg/dL (ref 2.5–4.6)

## 2015-03-13 LAB — PROTIME-INR
INR: 2.5 — ABNORMAL HIGH (ref 0.00–1.49)
PROTHROMBIN TIME: 26.6 s — AB (ref 11.6–15.2)

## 2015-03-13 SURGERY — IRRIGATION AND DEBRIDEMENT EXTREMITY
Anesthesia: General | Site: Wrist | Laterality: Right

## 2015-03-13 MED ORDER — LIDOCAINE HCL (PF) 2 % IJ SOLN
INTRAMUSCULAR | Status: DC | PRN
  Administered 2015-03-13: 60 mg via INTRADERMAL

## 2015-03-13 MED ORDER — BUPIVACAINE HCL (PF) 0.25 % IJ SOLN
INTRAMUSCULAR | Status: AC
  Filled 2015-03-13: qty 30

## 2015-03-13 MED ORDER — PROMETHAZINE HCL 25 MG/ML IJ SOLN: 6.2500 mg | INTRAMUSCULAR | Status: DC | PRN

## 2015-03-13 MED ORDER — LACTATED RINGERS IV SOLN
INTRAVENOUS | Status: DC | PRN
  Administered 2015-03-13: 12:00:00 via INTRAVENOUS

## 2015-03-13 MED ORDER — ONDANSETRON HCL 4 MG/2ML IJ SOLN
INTRAMUSCULAR | Status: DC | PRN
  Administered 2015-03-13: 4 mg via INTRAVENOUS

## 2015-03-13 MED ORDER — ARTIFICIAL TEARS OP OINT
TOPICAL_OINTMENT | OPHTHALMIC | Status: AC
  Filled 2015-03-13: qty 3.5

## 2015-03-13 MED ORDER — ONDANSETRON HCL 4 MG/2ML IJ SOLN
INTRAMUSCULAR | Status: AC
  Filled 2015-03-13: qty 2

## 2015-03-13 MED ORDER — BUPIVACAINE HCL (PF) 0.25 % IJ SOLN
INTRAMUSCULAR | Status: DC | PRN
  Administered 2015-03-13: 20 mL

## 2015-03-13 MED ORDER — FENTANYL CITRATE (PF) 100 MCG/2ML IJ SOLN
INTRAMUSCULAR | Status: DC | PRN
  Administered 2015-03-13 (×2): 50 ug via INTRAVENOUS

## 2015-03-13 MED ORDER — PROPOFOL 10 MG/ML IV BOLUS
INTRAVENOUS | Status: AC
  Filled 2015-03-13: qty 20

## 2015-03-13 MED ORDER — LIDOCAINE HCL (CARDIAC) 20 MG/ML IV SOLN
INTRAVENOUS | Status: AC
  Filled 2015-03-13: qty 5

## 2015-03-13 MED ORDER — FENTANYL CITRATE (PF) 100 MCG/2ML IJ SOLN: 25.0000 ug | INTRAMUSCULAR | Status: DC | PRN

## 2015-03-13 MED ORDER — GLYCOPYRROLATE 0.2 MG/ML IJ SOLN
INTRAMUSCULAR | Status: DC | PRN
  Administered 2015-03-13 (×2): 0.2 mg via INTRAVENOUS

## 2015-03-13 MED ORDER — PROPOFOL 10 MG/ML IV BOLUS
INTRAVENOUS | Status: DC | PRN
  Administered 2015-03-13: 150 mg via INTRAVENOUS

## 2015-03-13 MED ORDER — PIPERACILLIN-TAZOBACTAM IN DEX 2-0.25 GM/50ML IV SOLN
2.2500 g | Freq: Four times a day (QID) | INTRAVENOUS | Status: DC
  Administered 2015-03-13 – 2015-03-14 (×4): 2.25 g via INTRAVENOUS
  Filled 2015-03-13 (×5): qty 50

## 2015-03-13 MED ORDER — HYDROCODONE-ACETAMINOPHEN 7.5-325 MG PO TABS: 1.0000 | ORAL_TABLET | Freq: Once | ORAL | Status: DC | PRN

## 2015-03-13 MED ORDER — FENTANYL CITRATE (PF) 100 MCG/2ML IJ SOLN
INTRAMUSCULAR | Status: AC
  Filled 2015-03-13: qty 2

## 2015-03-13 MED ORDER — GLYCOPYRROLATE 0.2 MG/ML IJ SOLN
INTRAMUSCULAR | Status: AC
  Filled 2015-03-13: qty 2

## 2015-03-13 SURGICAL SUPPLY — 35 items
BAG ZIPLOCK 12X15 (MISCELLANEOUS) ×3 IMPLANT
BANDAGE ACE 3X5.8 VEL STRL LF (GAUZE/BANDAGES/DRESSINGS) ×3 IMPLANT
BNDG ELASTIC 2X5.8 VLCR STR LF (GAUZE/BANDAGES/DRESSINGS) ×3 IMPLANT
BNDG GAUZE ELAST 4 BULKY (GAUZE/BANDAGES/DRESSINGS) ×3 IMPLANT
CORDS BIPOLAR (ELECTRODE) ×3 IMPLANT
COVER SURGICAL LIGHT HANDLE (MISCELLANEOUS) ×3 IMPLANT
CUFF TOURN SGL QUICK 18 (TOURNIQUET CUFF) ×3 IMPLANT
CUFF TOURN SGL QUICK 24 (TOURNIQUET CUFF)
CUFF TRNQT CYL 24X4X40X1 (TOURNIQUET CUFF) IMPLANT
DRAIN PENROSE 18X1/2 LTX STRL (DRAIN) IMPLANT
DRAPE SURG 17X11 SM STRL (DRAPES) ×6 IMPLANT
ELECT REM PT RETURN 9FT ADLT (ELECTROSURGICAL) ×3
ELECTRODE REM PT RTRN 9FT ADLT (ELECTROSURGICAL) ×1 IMPLANT
GAUZE SPONGE 4X4 12PLY STRL (GAUZE/BANDAGES/DRESSINGS) ×3 IMPLANT
GLOVE BIOGEL M STRL SZ7.5 (GLOVE) ×3 IMPLANT
GLOVE BIOGEL PI IND STRL 6.5 (GLOVE) ×1 IMPLANT
GLOVE BIOGEL PI IND STRL 7.0 (GLOVE) ×1 IMPLANT
GLOVE BIOGEL PI IND STRL 7.5 (GLOVE) ×1 IMPLANT
GLOVE BIOGEL PI INDICATOR 6.5 (GLOVE) ×2
GLOVE BIOGEL PI INDICATOR 7.0 (GLOVE) ×2
GLOVE BIOGEL PI INDICATOR 7.5 (GLOVE) ×2
HANDPIECE INTERPULSE COAX TIP (DISPOSABLE) ×2
KIT BASIN OR (CUSTOM PROCEDURE TRAY) ×3 IMPLANT
NEEDLE HYPO 22GX1.5 SAFETY (NEEDLE) ×3 IMPLANT
NS IRRIG 1000ML POUR BTL (IV SOLUTION) ×3 IMPLANT
PACK ORTHO EXTREMITY (CUSTOM PROCEDURE TRAY) ×3 IMPLANT
POSITIONER SURGICAL ARM (MISCELLANEOUS) ×3 IMPLANT
SET HNDPC FAN SPRY TIP SCT (DISPOSABLE) ×1 IMPLANT
STOCKINETTE 6  STRL (DRAPES) ×2
STOCKINETTE 6 STRL (DRAPES) ×1 IMPLANT
SUT ETHILON 3 0 PS 1 (SUTURE) ×6 IMPLANT
SUT ETHILON 4 0 PS 2 18 (SUTURE) ×6 IMPLANT
SWAB COLLECTION DEVICE MRSA (MISCELLANEOUS) IMPLANT
SWAB CULTURE ESWAB REG 1ML (MISCELLANEOUS) IMPLANT
SYR CONTROL 10ML LL (SYRINGE) ×3 IMPLANT

## 2015-03-13 NOTE — Progress Notes (Signed)
PROGRESS NOTE  [name] ZOX:[id number] DOB: [birth year] DOA: 03/12/2015 PCP: Neena RhymesKatherine Tabori, MD   HPI: 82 yo F with DM2, HTN, admitted on 1/14 with 3 days of increasing right wrist pain / swelling without any associated trauma.   Subjective / 24 H Interval events - ongoing wrist pain this morning, able able to move it  Assessment/Plan: Active Problems:   Type II diabetes mellitus with nephropathy (HCC)   Essential hypertension   Atrial fibrillation (HCC)   Current use of long term anticoagulation   Cellulitis   Septic joint of right wrist (HCC)   CKD stage 3 due to type 2 diabetes mellitus (HCC)   Chronic diastolic CHF (congestive heart failure), NYHA class 1 (HCC)    Septic joint of right wrist (HCC)  - Dr. Izora Ribasoley consulted in the ED, s/p aspiration last night - OR today for further evaluation - continue antibiotics - consult ID if true septic arthritis   Atrial fibrillation (HCC)  - continue propranolol, keep off Coumadin for now until OR plans are clear - s/p Vit K on admission - CHA2DS2 vasc score is 6 once Coumadin is subtherapeutic and okayed by orthopedics can initiate heparin versus Lovenox bridge to coumadin  - INR 2.5 this morning  Essential hypertension - continue propranolol ovoid rebound tachycardia   Type II diabetes mellitus with nephropathy (HCC) - order sliding scale   CKD stage 3 due to type 2 diabetes mellitus (HCC)  - currently at baseline continue to monitor avoid renal toxic medications  Chronic diastolic heart failure  - currently appears to be euvolemic avoid fluid overload, continue lasix  Dyspnea patient has chronic dyspnea with exertion has been unchanged. We'll continue to monitor would recommend prior to discharge measure pulse oximetry with ambulation  Dementia  - mild will monitor for worsening while hospitalized   Diet:   Fluids: NS DVT Prophylaxis: SCD  Code Status: Full Code Family Communication: no family bedside    Disposition Plan: home when ready  Barriers to discharge: IV antibiotics, orthopedic consult  Consultants:  Orthopedic surgery   Procedures:  None    Antibiotics Vancomycin 1/14 >> Zosyn 1/14 >>   Studies  Dg Wrist Complete Right  03/12/2015  CLINICAL DATA:  Gradually increasing RIGHT wrist pain and tenderness since Thursday, awoke with pain, no known injury, history type II diabetes mellitus, obesity, hypertension, asthma, atrial fibrillation EXAM: RIGHT WRIST - COMPLETE 3+ VIEW COMPARISON:  None FINDINGS: Bones demineralized. Degenerative changes at [facility]. Minimal radiocarpal joint space narrowing. Cystic degenerative changes at lunate and triquetrum. Remain joint spaces preserved. No acute fracture, dislocation or additional bone destruction. IMPRESSION: Degenerative [facility] and minimally radiocarpal joint. Additional cystic degenerative changes at the lunatotriquetral joint. No other focal osseous abnormalities. Electronically Signed   By: Ulyses SouthwardMark  Boles M.D.   On: 03/12/2015 16:45   Objective  Filed Vitals:   03/12/15 2022 03/13/15 0203 03/13/15 0629 03/13/15 1117  BP: 156/63 115/45 126/48 127/63  Pulse: 51 65 56 57  Temp: 98.6 F (37 C) 98.7 F (37.1 C) 98.2 F (36.8 C)   TempSrc: Oral Oral Oral   Resp: 19 19 18 20   Height: 5' (1.524 m)     Weight: 76.658 kg (169 lb)     SpO2: 98% 95% 96% 98%    Intake/Output Summary (Last 24 hours) at 03/13/15 1229 Last data filed at 03/12/15 2230  Gross per 24 hour  Intake    120 ml  Output  0 ml  Net    120 ml   Filed Weights   03/12/15 2022  Weight: 76.658 kg (169 lb)   Exam:  GENERAL: NAD  HEENT: no scleral icterus, PERRL  NECK: supple, no LAD  LUNGS: CTA biL, no wheezing  HEART: RRR without MRG  ABDOMEN: soft, non tender  EXTREMITIES: no clubbing / cyanosis. Right wrist swollen, tender to palpation, tender with every movement.  NEUROLOGIC: non focal  Data Reviewed: Basic  Metabolic Panel:  Recent Labs Lab 03/12/15 1625 03/13/15 0531  NA 143 143  K 4.0 4.1  CL 103 105  CO2  --  26  GLUCOSE 125* 117*  BUN 38* 45*  CREATININE 1.40* 1.67*  CALCIUM  --  9.1  MG  --  2.3  PHOS  --  3.9   Liver Function Tests:  Recent Labs Lab 03/13/15 0531  AST 21  ALT 14  ALKPHOS 51  BILITOT 1.5*  PROT 6.7  ALBUMIN 3.6   No results for input(s): LIPASE, AMYLASE in the last 168 hours. No results for input(s): AMMONIA in the last 168 hours. CBC:  Recent Labs Lab 03/12/15 1616 03/12/15 1625 03/13/15 0531  WBC 11.2*  --  6.6  NEUTROABS 8.4*  --   --   HGB 13.7 14.6 12.0  HCT 42.0 43.0 37.9  MCV 89.0  --  89.6  PLT 158  --  148*   Cardiac Enzymes: No results for input(s): CKTOTAL, CKMB, CKMBINDEX, TROPONINI in the last 168 hours. BNP (last 3 results) No results for input(s): BNP in the last 8760 hours.  ProBNP (last 3 results) No results for input(s): PROBNP in the last 8760 hours.  CBG:  Recent Labs Lab 03/12/15 2014 03/13/15 0005 03/13/15 0410 03/13/15 0738 03/13/15 1122  GLUCAP 168* 113* 105* 114* 116*    Recent Results (from the past 240 hour(s))  Body fluid culture     Status: None (Preliminary result)   Collection Time: 03/12/15  6:04 PM  Result Value Ref Range Status   Specimen Description JOINT FLUID R WRIST  Final   Special Requests Normal  Final   Gram Stain   Final    ABUNDANT WBC PRESENT, PREDOMINANTLY PMN NO ORGANISMS SEEN CRITICAL RESULT CALLED TO, READ BACK BY AND VERIFIED WITHRogelia Rohrer [phone] WILDERK Performed at [facility]    Culture PENDING  Incomplete   Report Status PENDING  Incomplete  MRSA PCR Screening     Status: None   Collection Time: 03/13/15 12:08 AM  Result Value Ref Range Status   MRSA by PCR NEGATIVE NEGATIVE Final    Comment:        The GeneXpert MRSA Assay (FDA approved for NASAL specimens only), is one component of a comprehensive MRSA colonization surveillance program. It  is not intended to diagnose MRSA infection nor to guide or monitor treatment for MRSA infections.      Scheduled Meds: . [MAR Hold] amLODipine  10 mg Oral QHS  . [MAR Hold] docusate sodium  100 mg Oral BID  . [MAR Hold] furosemide  40 mg Oral Daily  . [MAR Hold] hydrALAZINE  25-50 mg Oral q morning - 10a  . [MAR Hold] hydrALAZINE  50 mg Oral QPM  . [MAR Hold] insulin aspart  0-9 Units Subcutaneous 6 times per day  . [MAR Hold] piperacillin-tazobactam (ZOSYN)  IV  2.25 g Intravenous Q6H  . [MAR Hold] propranolol  20 mg Oral BID  . [MAR Hold] senna  1  tablet Oral BID  . [MAR Hold] simvastatin  20 mg Oral QHS  . Tdap  0.5 mL Intramuscular Once  . [MAR Hold] vancomycin  1,000 mg Intravenous Q24H   Continuous Infusions:    Pamella Pert, MD [facility] Pager [phone]. If 7 PM - 7 AM, please contact night-coverage at [email or web address], password [facility] 03/13/2015, 12:29 PM  LOS: 1 day

## 2015-03-13 NOTE — Anesthesia Preprocedure Evaluation (Addendum)
Anesthesia Evaluation  Patient identified by MRN, date of birth, ID band Patient awake    Reviewed: Allergy & Precautions, NPO status , Patient's Chart, lab work & pertinent test results  Airway Mallampati: II  TM Distance: >3 FB Neck ROM: Full    Dental  (+) Dental Advisory Given   Pulmonary shortness of breath and with exertion, asthma ,    breath sounds clear to auscultation       Cardiovascular hypertension, Pt. on medications and Pt. on home beta blockers +CHF and + DOE  + dysrhythmias Atrial Fibrillation  Rhythm:Regular Rate:Normal     Neuro/Psych negative neurological ROS     GI/Hepatic Neg liver ROS, GERD  ,  Endo/Other  diabetes, Type 2  Renal/GU CRFRenal disease     Musculoskeletal  (+) Arthritis ,   Abdominal   Peds  Hematology negative hematology ROS (+)   Anesthesia Other Findings   Reproductive/Obstetrics                            Lab Results  Component Value Date   WBC 6.6 03/13/2015   HGB 12.0 03/13/2015   HCT 37.9 03/13/2015   MCV 89.6 03/13/2015   PLT 148* 03/13/2015   Lab Results  Component Value Date   CREATININE 1.67* 03/13/2015   BUN 45* 03/13/2015   NA 143 03/13/2015   K 4.1 03/13/2015   CL 105 03/13/2015   CO2 26 03/13/2015   Lab Results  Component Value Date   INR 2.50* 03/13/2015   INR 2.23* 03/12/2015   INR 2.3* 09/09/2014    Anesthesia Physical Anesthesia Plan  ASA: III  Anesthesia Plan: General   Post-op Pain Management:    Induction: Intravenous  Airway Management Planned: LMA  Additional Equipment:   Intra-op Plan:   Post-operative Plan: Extubation in OR  Informed Consent: I have reviewed the patients History and Physical, chart, labs and discussed the procedure including the risks, benefits and alternatives for the proposed anesthesia with the patient or authorized representative who has indicated his/her understanding and  acceptance.   Dental advisory given  Plan Discussed with: CRNA  Anesthesia Plan Comments:         Anesthesia Quick Evaluation

## 2015-03-13 NOTE — Transfer of Care (Signed)
Immediate Anesthesia Transfer of Care Note  Patient: [name]  Procedure(s) Performed: Procedure(s): IRRIGATION AND DEBRIDEMENT RIGHT WRIST (Right)  Patient Location: PACU  Anesthesia Type:General  Level of Consciousness: awake, sedated and responds to stimulation  Airway & Oxygen Therapy: Patient Spontanous Breathing and Patient connected to face mask oxygen  Post-op Assessment: Report given to RN and Post -op Vital signs reviewed and stable  Post vital signs: Reviewed and stable  Last Vitals:  Filed Vitals:   03/13/15 0629 03/13/15 1117  BP: 126/48 127/63  Pulse: 56 57  Temp: 36.8 C   Resp: 18 20    Complications: No apparent anesthesia complications

## 2015-03-13 NOTE — Progress Notes (Signed)
ANTIBIOTIC CONSULT NOTE - INITIAL  Pharmacy Consult for vancomycin/Zosyn Indication: septic joint  No Known Allergies  Patient Measurements: Height: 5' (152.4 cm) Weight: 169 lb (76.658 kg) IBW/kg (Calculated) : 45.5 Adjusted Body Weight:   Vital Signs: Temp: 98.2 F (36.8 C) (01/15 0629) Temp Source: Oral (01/15 0629) BP: 127/63 mmHg (01/15 1117) Pulse Rate: 57 (01/15 1117) Intake/Output from previous day: 01/14 0701 - 01/15 0700 In: 120 [P.O.:120] Out: -  Intake/Output from this shift:    Labs:  Recent Labs  03/12/15 1616 03/12/15 1625 03/13/15 0531  WBC 11.2*  --  6.6  HGB 13.7 14.6 12.0  PLT 158  --  148*  CREATININE  --  1.40* 1.67*   Estimated Creatinine Clearance: 23.4 mL/min (by C-G formula based on Cr of 1.67). No results for input(s): VANCOTROUGH, VANCOPEAK, VANCORANDOM, GENTTROUGH, GENTPEAK, GENTRANDOM, TOBRATROUGH, TOBRAPEAK, TOBRARND, AMIKACINPEAK, AMIKACINTROU, AMIKACIN in the last 72 hours.   Microbiology: Recent Results (from the past 720 hour(s))  Body fluid culture     Status: None (Preliminary result)   Collection Time: 03/12/15  6:04 PM  Result Value Ref Range Status   Specimen Description JOINT FLUID R WRIST  Final   Special Requests Normal  Final   Gram Stain   Final    ABUNDANT WBC PRESENT, PREDOMINANTLY PMN NO ORGANISMS SEEN CRITICAL RESULT CALLED TO, READ BACK BY AND VERIFIED WITHRogelia Rohrer: H PENG,RN [phone] WILDERK [facility]formed [facility]    Culture PENDING  Incomplete   Report Status PENDING  Incomplete  MRSA PCR Screening     Status: None   Collection Time: 03/13/15 12:08 AM  Result Value Ref Range Status   MRSA by PCR NEGATIVE NEGATIVE Final    Comment:        The GeneXpert MRSA Assay (FDA approved for NASAL specimens only), is one component of a comprehensive MRSA colonization surveillance program. It is not intended to diagnose MRSA infection nor to guide or monitor treatment for MRSA infections.     Medical  History: Past Medical History  Diagnosis Date  . DIABETES-TYPE 2   . Other and unspecified hyperlipidemia   . OBESITY   . HYPERTENSION   . Intrinsic asthma, unspecified   . GERD   . KNEE PAIN   . Edema   . DYSPNEA ON EXERTION   . Atrial fibrillation (HCC)     Medications:  Scheduled:  . amLODipine  10 mg Oral QHS  . docusate sodium  100 mg Oral BID  . furosemide  40 mg Oral Daily  . hydrALAZINE  25-50 mg Oral q morning - 10a  . hydrALAZINE  50 mg Oral QPM  . insulin aspart  0-9 Units Subcutaneous 6 times per day  . piperacillin-tazobactam (ZOSYN)  IV  2.25 g Intravenous Q6H  . propranolol  20 mg Oral BID  . senna  1 tablet Oral BID  . simvastatin  20 mg Oral QHS  . Tdap  0.5 mL Intramuscular Once  . vancomycin  1,000 mg Intravenous Q24H   Assessment: Pt is a 82 yo female who presented with 3 day history of gradually increasing right wrist pain and swelling with some redness. In ED orthopedics consult has been called for wrist joint aspiration.  Dr.Coley came to evaluate patient. Joint aspiration was done, results currently not available. Clinically Dr. Izora Ribasoley suspects septic joint. Will initiate IV antibiotics and admit to medicine, if infection does not improve she'll likely need to go to OR tomorrow  (1/15) for washout.  1/14 >> vancomycin >> 1/14/ >> Zosyn >>     Cultures: 1/14 body fluid ( wrist): ngtd, no organisms on gram stain  Goal of Therapy:  Vancomycin trough level 15-20 mcg/ml  Plan:   Continue Vancomycin 1gm q24  Reduce Zosyn from 3.375gm q8 to 2.25 gm q6hr  Follow renal function, cultures  Anticipate OR today  Otho Bellows PharmD Pager [phone] 03/13/2015, 11:34 AM

## 2015-03-13 NOTE — Anesthesia Procedure Notes (Signed)
Procedure Name: LMA Insertion Date/Time: 03/13/2015 12:15 PM Performed by: Elyn PeersALLEN, Rylynne Schicker J Pre-anesthesia Checklist: Patient identified, Emergency Drugs available, Suction available, Patient being monitored and Timeout performed Patient Re-evaluated:Patient Re-evaluated prior to inductionOxygen Delivery Method: Circle system utilized Preoxygenation: Pre-oxygenation with 100% oxygen Intubation Type: IV induction Ventilation: Mask ventilation without difficulty LMA: LMA inserted and LMA with gastric port inserted LMA Size: 4.0 Number of attempts: 1 Placement Confirmation: positive ETCO2 and breath sounds checked- equal and bilateral Tube secured with: Tape Dental Injury: Teeth and Oropharynx as per pre-operative assessment

## 2015-03-14 ENCOUNTER — Ambulatory Visit: Payer: Commercial Managed Care - HMO | Admitting: Family Medicine

## 2015-03-14 ENCOUNTER — Encounter (HOSPITAL_COMMUNITY): Payer: Self-pay | Admitting: General Surgery

## 2015-03-14 DIAGNOSIS — B9689 Other specified bacterial agents as the cause of diseases classified elsewhere: Secondary | ICD-10-CM

## 2015-03-14 DIAGNOSIS — M00831 Arthritis due to other bacteria, right wrist: Secondary | ICD-10-CM

## 2015-03-14 LAB — BASIC METABOLIC PANEL
Anion gap: 12 (ref 5–15)
BUN: 37 mg/dL — AB (ref 6–20)
CALCIUM: 8.9 mg/dL (ref 8.9–10.3)
CO2: 27 mmol/L (ref 22–32)
Chloride: 100 mmol/L — ABNORMAL LOW (ref 101–111)
Creatinine, Ser: 1.61 mg/dL — ABNORMAL HIGH (ref 0.44–1.00)
GFR calc Af Amer: 33 mL/min — ABNORMAL LOW (ref >60)
GFR, EST NON AFRICAN AMERICAN: 28 mL/min — AB (ref >60)
GLUCOSE: 118 mg/dL — AB (ref 65–99)
POTASSIUM: 4.3 mmol/L (ref 3.5–5.1)
Sodium: 139 mmol/L (ref 135–145)

## 2015-03-14 LAB — CBC
HCT: 36.7 % (ref 36.0–46.0)
Hemoglobin: 11.5 g/dL — ABNORMAL LOW (ref 12.0–15.0)
MCH: 28.7 pg (ref 26.0–34.0)
MCHC: 31.3 g/dL (ref 30.0–36.0)
MCV: 91.5 fL (ref 78.0–100.0)
PLATELETS: 142 10*3/uL — AB (ref 150–400)
RBC: 4.01 MIL/uL (ref 3.87–5.11)
RDW: 16.1 % — AB (ref 11.5–15.5)
WBC: 5.8 10*3/uL (ref 4.0–10.5)

## 2015-03-14 LAB — GLUCOSE, CAPILLARY
GLUCOSE-CAPILLARY: 116 mg/dL — AB (ref 65–99)
Glucose-Capillary: 102 mg/dL — ABNORMAL HIGH (ref 65–99)
Glucose-Capillary: 109 mg/dL — ABNORMAL HIGH (ref 65–99)
Glucose-Capillary: 114 mg/dL — ABNORMAL HIGH (ref 65–99)
Glucose-Capillary: 116 mg/dL — ABNORMAL HIGH (ref 65–99)
Glucose-Capillary: 132 mg/dL — ABNORMAL HIGH (ref 65–99)

## 2015-03-14 LAB — HEMOGLOBIN A1C
Hgb A1c MFr Bld: 6.2 % — ABNORMAL HIGH (ref 4.8–5.6)
Mean Plasma Glucose: 131 mg/dL

## 2015-03-14 LAB — C-REACTIVE PROTEIN: CRP: 6.2 mg/dL — ABNORMAL HIGH (ref <1.0)

## 2015-03-14 MED ORDER — PIPERACILLIN-TAZOBACTAM 3.375 G IVPB
3.3750 g | Freq: Three times a day (TID) | INTRAVENOUS | Status: DC
  Filled 2015-03-14: qty 50

## 2015-03-14 MED ORDER — WARFARIN SODIUM 6 MG PO TABS
6.0000 mg | ORAL_TABLET | Freq: Once | ORAL | Status: AC
Start: 2015-03-14 — End: 2015-03-14
  Administered 2015-03-14: 6 mg via ORAL
  Filled 2015-03-14: qty 1

## 2015-03-14 MED ORDER — VANCOMYCIN HCL IN DEXTROSE 750-5 MG/150ML-% IV SOLN
750.0000 mg | INTRAVENOUS | Status: DC
  Administered 2015-03-14 – 2015-03-15 (×2): 750 mg via INTRAVENOUS
  Filled 2015-03-14 (×2): qty 150

## 2015-03-14 MED ORDER — INSULIN ASPART 100 UNIT/ML ~~LOC~~ SOLN
0.0000 [IU] | Freq: Three times a day (TID) | SUBCUTANEOUS | Status: DC
  Administered 2015-03-15: 1 [IU] via SUBCUTANEOUS

## 2015-03-14 MED ORDER — WARFARIN - PHARMACIST DOSING INPATIENT: Freq: Every day | Status: DC

## 2015-03-14 NOTE — Op Note (Signed)
NAMReece Packer:  [name], Kathryn                 ACCOUNT NO.:  [id number]  MEDICAL RECORD NO.:  [id number]  LOCATION:  [facility]                         [facility]  PHYSICIAN:  Johnette AbrahamHarrill C Mersadez Linden, MD    DATE OF BIRTH:  08-Apr-1929  DATE OF PROCEDURE:  03/13/2015 DATE OF DISCHARGE:                              OPERATIVE REPORT   PREOPERATIVE DIAGNOSIS:  Presumed septic wrist joint of the right wrist.  POSTOPERATIVE DIAGNOSIS:  Presumed septic wrist joint of the right wrist.  PROCEDURE:  Arthrotomy with right wrist radial carpal wrist joint washout.  INDICATIONS:  [name] is an 82 year old female who presented to emergency room yesterday with signs and symptoms of septic wrist joint with pain, swelling, loss of motion, and an elevated white blood cell count.  She had no other source for this.  The wrist joint was aspirated in the emergency department yesterday which revealed some tan colored fluid, this was sent off for culture.  Cultures came back revealing numerous white blood cell count, gram stain was pending, cultures were pending.  In lieu of this, I felt the best option was to wash out the wrist joint.  Risks, benefits, and alternatives of this were discussed with her and her daughter.  She agreed with this plan and consent was obtained.  DESCRIPTION OF PROCEDURE:  The patient was taken to the operating room, placed supine on the operating table.  Time-out was performed.  General anesthesia was administered without difficulty.  The right upper extremity was prepped and draped in normal sterile fashion.  Two portals were created on the right wrist, one portal __________scaphoid-lunate junction.  A 16-gauge Angiocath was carefully inserted on the dorsal wrist and nice encounter of the joint without any difficulty.  A second portal was created with a 16-gauge Angiocath on the ulnar side of the wrist, this was placed without difficulty.  Upon entering, there was evidence again of some tan  colored synovial fluid.  There was no gross purulence.  There was so little fluid, however, this was unable to be aspirated to be sent off further.  Next, irrigation of the joint through the dorsal portal was performed allowing exit of the fluid along the ulnar wrist.  This was performed for approximately 500 mL.  Fluid remained clear.  There was a small amount of sediment, whether this was some white synovium or this represented some candy crystals.  However, irrigation of the wrist joint was then performed.  Afterwards, a small amount of Marcaine was infiltrated in the portals.  Portals were removed.  Compression was used to control hemostasis since the patient was on Coumadin, and sterile dressing was applied.  She will be continued on her IV antibiotics.  We will check her cultures later this afternoon and tomorrow, and then tailor her antibiotics, and I do not think she will require repeat wrist washout.     Johnette AbrahamHarrill C Toluwani Ruder, MD    HCC/MEDQ  D:  03/13/2015  T:  03/13/2015  Job:  [id number]

## 2015-03-14 NOTE — Care Management Note (Signed)
Case Management Note  Patient Details  Name: [name] MRN: [id number] Date of Birth: 1929-07-15  Subjective/Objective:  82 y/o f admitted w/wrist cellulitis. From home.                  Action/Plan:d/c plan home.   Expected Discharge Date:                 Expected Discharge Plan:  Home/Self Care  In-House Referral:     Discharge planning Services  CM Consult  Post Acute Care Choice:    Choice offered to:     DME Arranged:    DME Agency:     HH Arranged:    HH Agency:     Status of Service:  In process, will continue to follow  Medicare Important Message Given:    Date Medicare IM Given:    Medicare IM give by:    Date Additional Medicare IM Given:    Additional Medicare Important Message give by:     If discussed at Long Length of Stay Meetings, dates discussed:    Additional Comments:  Lanier ClamMahabir, Rogen Porte, RN 03/14/2015, 4:20 PM

## 2015-03-14 NOTE — Progress Notes (Signed)
S:denies hand pain, able to move fingers, wrist more  O:Blood pressure 136/54, pulse 57, temperature 98.1 F (36.7 C), temperature source Oral, resp. rate 18, height 5' (1.524 m), weight 76.658 kg (169 lb), SpO2 95 %.  Dressing removed; min bruising, some swelling from ace wrap, no erythema Cx: no organism A:s/p wrist joint wash out   P:cont loose ace wrap for comfort, will need to go home on oral abx. F/u with me if pt desires

## 2015-03-14 NOTE — Progress Notes (Addendum)
ANTIBIOTIC CONSULT NOTE - follow up  Pharmacy Consult for vancomycin/Zosyn Indication: septic joint  No Known Allergies  Patient Measurements: Height: 5' (152.4 cm) Weight: 169 lb (76.658 kg) IBW/kg (Calculated) : 45.5 Adjusted Body Weight:   Vital Signs: Temp: 98.1 F (36.7 C) (01/16 0645) Temp Source: Oral (01/16 0645) BP: 119/57 mmHg (01/16 0645) Pulse Rate: 48 (01/16 0645) Intake/Output from previous day: 01/15 0701 - 01/16 0700 In: 1180 [P.O.:480; I.V.:200; IV Piggyback:500] Out: -  Intake/Output from this shift:    Labs:  Recent Labs  03/12/15 1616 03/12/15 1625 03/13/15 0531 03/14/15 0505  WBC 11.2*  --  6.6 5.8  HGB 13.7 14.6 12.0 11.5*  PLT 158  --  148* 142*  CREATININE  --  1.40* 1.67* 1.61*   Estimated Creatinine Clearance: 24.2 mL/min (by C-G formula based on Cr of 1.61). No results for input(s): VANCOTROUGH, VANCOPEAK, VANCORANDOM, GENTTROUGH, GENTPEAK, GENTRANDOM, TOBRATROUGH, TOBRAPEAK, TOBRARND, AMIKACINPEAK, AMIKACINTROU, AMIKACIN in the last 72 hours.   Microbiology: Recent Results (from the past 720 hour(s))  Body fluid culture     Status: None (Preliminary result)   Collection Time: 03/12/15  6:04 PM  Result Value Ref Range Status   Specimen Description JOINT FLUID R WRIST  Final   Special Requests Normal  Final   Gram Stain   Final    ABUNDANT WBC PRESENT, PREDOMINANTLY PMN NO ORGANISMS SEEN CRITICAL RESULT CALLED TO, READ BACK BY AND VERIFIED WITHRogelia Rohrer: H PENG,RN [phone] WILDERK [facility]formed [facility]    Culture PENDING  Incomplete   Report Status PENDING  Incomplete  MRSA PCR Screening     Status: None   Collection Time: 03/13/15 12:08 AM  Result Value Ref Range Status   MRSA by PCR NEGATIVE NEGATIVE Final    Comment:        The GeneXpert MRSA Assay (FDA approved for NASAL specimens only), is one component of a comprehensive MRSA colonization surveillance program. It is not intended to diagnose MRSA infection nor to  guide or monitor treatment for MRSA infections.     Medical History: Past Medical History  Diagnosis Date  . DIABETES-TYPE 2   . Other and unspecified hyperlipidemia   . OBESITY   . HYPERTENSION   . Intrinsic asthma, unspecified   . GERD   . KNEE PAIN   . Edema   . DYSPNEA ON EXERTION   . Atrial fibrillation (HCC)     Medications:  Scheduled:  . amLODipine  10 mg Oral QHS  . docusate sodium  100 mg Oral BID  . furosemide  40 mg Oral Daily  . hydrALAZINE  25-50 mg Oral q morning - 10a  . hydrALAZINE  50 mg Oral QPM  . insulin aspart  0-9 Units Subcutaneous 6 times per day  . piperacillin-tazobactam (ZOSYN)  IV  2.25 g Intravenous Q6H  . propranolol  20 mg Oral BID  . senna  1 tablet Oral BID  . simvastatin  20 mg Oral QHS  . Tdap  0.5 mL Intramuscular Once  . vancomycin  1,000 mg Intravenous Q24H   Assessment: Pt is a 82 yo female who presented with 3 day history of gradually increasing right wrist pain and swelling with some redness. In ED orthopedics consult has been called for wrist joint aspiration.  Dr.Coley came to evaluate patient. Joint aspiration was done, results currently not available. Clinically Dr. Izora Ribasoley suspects septic joint. Will initiate IV antibiotics and admit to medicine, if infection does not improve she'll likely  need to go to OR tomorrow  (1/15) for washout.   1/14 >> vancomycin >> 1/14 >> Zosyn >>     SCr 1.61 CrCl 24  Afebrile  WBC improved, WNL  Cultures: 1/14 body fluid ( wrist): ngtd, no organisms on gram stain  Goal of Therapy:  Vancomycin trough level 15-20 mcg/ml  Plan:  Day 3 Abxs  Change vancomycin from 1g to 750mg  q24 due to CrCl 20-30 ml/min  Change Zosyn to 3.375gm q8 for CrCl > 20  Follow renal function, cultures   Hessie Knows, PharmD, BCPS Pager [phone] 03/14/2015 8:47 AM

## 2015-03-14 NOTE — Progress Notes (Signed)
ANTICOAGULATION CONSULT NOTE - Initial Consult  Pharmacy Consult for Warfarin Indication: atrial fibrillation  No Known Allergies  Patient Measurements: Height: 5' (152.4 cm) Weight: 169 lb (76.658 kg) IBW/kg (Calculated) : 45.5 Heparin Dosing Weight:   Vital Signs: Temp: 98.5 F (36.9 C) (01/16 1620) Temp Source: Oral (01/16 1620) BP: 119/53 mmHg (01/16 1620) Pulse Rate: 50 (01/16 1620)  Labs:  Recent Labs  03/12/15 1616 03/12/15 1625 03/13/15 0531 03/14/15 0505  HGB 13.7 14.6 12.0 11.5*  HCT 42.0 43.0 37.9 36.7  PLT 158  --  148* 142*  LABPROT 24.5*  --  26.6*  --   INR 2.23*  --  2.50*  --   CREATININE  --  1.40* 1.67* 1.61*    Estimated Creatinine Clearance: 24.2 mL/min (by C-G formula based on Cr of 1.61).   Medical History: Past Medical History  Diagnosis Date  . DIABETES-TYPE 2   . Other and unspecified hyperlipidemia   . OBESITY   . HYPERTENSION   . Intrinsic asthma, unspecified   . GERD   . KNEE PAIN   . Edema   . DYSPNEA ON EXERTION   . Atrial fibrillation [facility](HCC)       Assessment: 89 yoF admitted with septic joint of right wrist s/p aspiration on admission and OR for washout on 1/15.  Patient is on chronic warfarin therapy PTA for atrial fibrillation which was placed on hold for procedure.  Pharmacy asked to resume 1/16 PM.  PTA warfarin dose documented as 6 mg daily.  LD 1/13.  INR 2.23 (1/14), 2.50 (1/15).  INR increased yesterday despite holding warfarin.  Patient also received 2.5 mg Vitamin K on 1/14.  No INR today but would anticipate that the INR is trending down now.  Hgb 11.5, Platelets 142.  Goal of Therapy:  INR 2-3   Plan:  1.  Warfarin 6 mg once tonight. 2.  Daily INR.  Clance Bollunyon, Malaquias Lenker 03/14/2015,6:14 PM

## 2015-03-14 NOTE — Consult Note (Addendum)
  [facility]  Total days of antibiotics 3        Day 3 vanco/piptazo         Reason for Consult: right wrist septic arthritis   Referring Physician: gherghe  Active Problems:   Type II diabetes mellitus with nephropathy (HCC)   Essential hypertension   Atrial fibrillation (HCC)   Current use of long term anticoagulation   Cellulitis   Septic joint of right wrist (HCC)   CKD stage 3 due to type 2 diabetes mellitus (HCC)   Chronic diastolic CHF (congestive heart failure), NYHA class 1 (HCC)    HPI: [name] is a 82 y.o. female with hx of DM2, CKD3, afib on coumadin who was admitted on 1/14 for progressive right wrist pain, tenderness and swelling, erythema that progressed over 4-5days. No trauma or animal scratchs or any nearby skin lesions to the wrist that she recalls. She was evaluated by Dr. Colley where an aspirate of joint had cloudy fluid but cell count unable to be done. Fluid showed abundant WBC no organism. Cx is NGTD at 36hr. Patient had elevated WBC of 11K. She was started on vancomycin and piptazo. She underwent debridement on 1/15. ID consulted for abtx. She has remained afebrile during this admission. She states that since her surgery, her right wrist is feeling better, she is able to extend her hands fully. Less pain associated with her wrist.  She will be staying with her daughter after the surgery  Past Medical History  Diagnosis Date  . DIABETES-TYPE 2   . Other and unspecified hyperlipidemia   . OBESITY   . HYPERTENSION   . Intrinsic asthma, unspecified   . GERD   . KNEE PAIN   . Edema   . DYSPNEA ON EXERTION   . Atrial fibrillation (HCC)     Allergies: No Known Allergies   MEDICATIONS: . amLODipine  10 mg Oral QHS  . docusate sodium  100 mg Oral BID  . furosemide  40 mg Oral Daily  . hydrALAZINE  25-50 mg Oral q morning - 10a  . hydrALAZINE  50 mg Oral QPM  . insulin aspart  0-9 Units Subcutaneous 6 times per day  .  piperacillin-tazobactam (ZOSYN)  IV  3.375 g Intravenous Q8H  . propranolol  20 mg Oral BID  . senna  1 tablet Oral BID  . simvastatin  20 mg Oral QHS  . Tdap  0.5 mL Intramuscular Once  . vancomycin  750 mg Intravenous Q24H    Social History  Substance Use Topics  . Smoking status: Never Smoker   . Smokeless tobacco: None  . Alcohol Use: No    Family History  Problem Relation Age of Onset  . Coronary artery disease Mother   . Hypertension Mother   . Hypertension Father   . Prostate cancer Father      Review of Systems  Constitutional: Negative for fever, chills, diaphoresis, activity change, appetite change, fatigue and unexpected weight change.  HENT: Negative for congestion, sore throat, rhinorrhea, sneezing, trouble swallowing and sinus pressure.  Eyes: Negative for photophobia and visual disturbance.  Respiratory: Negative for cough, chest tightness, shortness of breath, wheezing and stridor.  Cardiovascular: Negative for chest pain, palpitations and leg swelling.  Gastrointestinal: Negative for nausea, vomiting, abdominal pain, diarrhea, constipation, blood in stool, abdominal distention and anal bleeding.  Genitourinary: Negative for dysuria, hematuria, flank pain and difficulty urinating.  Musculoskeletal: right wrist pain, swelling, tendnerness Skin: Negative   for color change, pallor, rash and wound.  Neurological: Negative for dizziness, tremors, weakness and light-headedness.  Hematological: Negative for adenopathy. Does not bruise/bleed easily.  Psychiatric/Behavioral: Negative for behavioral problems, confusion, sleep disturbance, dysphoric mood, decreased concentration and agitation.     OBJECTIVE: BP 136/54 mmHg  Pulse 57  Temp(Src) 98.1 F (36.7 C) (Oral)  Resp 18  Ht 5' (1.524 m)  Wt 169 lb (76.658 kg)  BMI 33.01 kg/m2  SpO2 95% Physical Exam  Constitutional:  oriented to person, place, and time. appears well-developed and well-nourished. No  distress.  HENT: [place]/AT, PERRLA, no scleral icterus Mouth/Throat: Oropharynx is clear and moist. No oropharyngeal exudate.  Cardiovascular: Normal rate, regular rhythm and normal heart sounds. Exam reveals no gallop and no friction rub.  No murmur heard.  Pulmonary/Chest: Effort normal and breath sounds normal. No respiratory distress.  has no wheezes.  Neck = supple, no nuchal rigidity Abdominal: Soft. Bowel sounds are normal.  exhibits no distension. There is no tenderness.  Lymphadenopathy: no cervical adenopathy. No axillary adenopathy Neurological: alert and oriented to person, place, and time.  Skin: Skin is warm and dry. No rash noted. No erythema.  Psychiatric: a normal mood and affect.  behavior is normal.  Ext: right wrist is wrapped LABS: Results for orders placed or performed during the hospital encounter of 03/12/15 (from the past 48 hour(s))  CBG monitoring, ED     Status: Abnormal   Collection Time: 03/12/15  3:45 PM  Result Value Ref Range   Glucose-Capillary 109 (H) 65 - 99 mg/dL  CBC with Differential     Status: Abnormal   Collection Time: 03/12/15  4:16 PM  Result Value Ref Range   WBC 11.2 (H) 4.0 - 10.5 K/uL   RBC 4.72 3.87 - 5.11 MIL/uL   Hemoglobin 13.7 12.0 - 15.0 g/dL   HCT 42.0 36.0 - 46.0 %   MCV 89.0 78.0 - 100.0 fL   MCH 29.0 26.0 - 34.0 pg   MCHC 32.6 30.0 - 36.0 g/dL   RDW 15.9 (H) 11.5 - 15.5 %   Platelets 158 150 - 400 K/uL   Neutrophils Relative % 75 %   Neutro Abs 8.4 (H) 1.7 - 7.7 K/uL   Lymphocytes Relative 12 %   Lymphs Abs 1.3 0.7 - 4.0 K/uL   Monocytes Relative 13 %   Monocytes Absolute 1.4 (H) 0.1 - 1.0 K/uL   Eosinophils Relative 0 %   Eosinophils Absolute 0.0 0.0 - 0.7 K/uL   Basophils Relative 0 %   Basophils Absolute 0.0 0.0 - 0.1 K/uL  Protime-INR     Status: Abnormal   Collection Time: 03/12/15  4:16 PM  Result Value Ref Range   Prothrombin Time 24.5 (H) 11.6 - 15.2 seconds   INR 2.23 (H) 0.00 - 1.49  I-stat Chem 8, ED      Status: Abnormal   Collection Time: 03/12/15  4:25 PM  Result Value Ref Range   Sodium 143 135 - 145 mmol/L   Potassium 4.0 3.5 - 5.1 mmol/L   Chloride 103 101 - 111 mmol/L   BUN 38 (H) 6 - 20 mg/dL   Creatinine, Ser 1.40 (H) 0.44 - 1.00 mg/dL   Glucose, Bld 125 (H) 65 - 99 mg/dL   Calcium, Ion 1.10 (L) 1.13 - 1.30 mmol/L   TCO2 26 0 - 100 mmol/L   Hemoglobin 14.6 12.0 - 15.0 g/dL   HCT 43.0 36.0 - 46.0 %  Body fluid culture       Status: None (Preliminary result)   Collection Time: 03/12/15  6:04 PM  Result Value Ref Range   Specimen Description JOINT FLUID R WRIST    Special Requests Normal    Gram Stain      ABUNDANT WBC PRESENT, PREDOMINANTLY PMN NO ORGANISMS SEEN CRITICAL RESULT CALLED TO, READ BACK BY AND VERIFIED WITH: H PENG,RN [id number] WILDERK    Culture      NO GROWTH 1 DAY Performed at [facility]    Report Status PENDING   Glucose, capillary     Status: Abnormal   Collection Time: 03/12/15  8:14 PM  Result Value Ref Range   Glucose-Capillary 168 (H) 65 - 99 mg/dL  Glucose, capillary     Status: Abnormal   Collection Time: 03/13/15 12:05 AM  Result Value Ref Range   Glucose-Capillary 113 (H) 65 - 99 mg/dL  MRSA PCR Screening     Status: None   Collection Time: 03/13/15 12:08 AM  Result Value Ref Range   MRSA by PCR NEGATIVE NEGATIVE    Comment:        The GeneXpert MRSA Assay (FDA approved for NASAL specimens only), is one component of a comprehensive MRSA colonization surveillance program. It is not intended to diagnose MRSA infection nor to guide or monitor treatment for MRSA infections.   Glucose, capillary     Status: Abnormal   Collection Time: 03/13/15  4:10 AM  Result Value Ref Range   Glucose-Capillary 105 (H) 65 - 99 mg/dL  Hemoglobin A1c     Status: Abnormal   Collection Time: 03/13/15  5:31 AM  Result Value Ref Range   Hgb A1c MFr Bld 6.2 (H) 4.8 - 5.6 %    Comment: (NOTE)         Pre-diabetes: 5.7 - 6.4         Diabetes:  >6.4         Glycemic control for adults with diabetes: <7.0    Mean Plasma Glucose 131 mg/dL    Comment: (NOTE) Performed At: [facility] [address] [place], [place] [id number] [place] William F MD Ph:[phone]   Magnesium     Status: None   Collection Time: 03/13/15  5:31 AM  Result Value Ref Range   Magnesium 2.3 1.7 - 2.4 mg/dL  Phosphorus     Status: None   Collection Time: 03/13/15  5:31 AM  Result Value Ref Range   Phosphorus 3.9 2.5 - 4.6 mg/dL  TSH     Status: None   Collection Time: 03/13/15  5:31 AM  Result Value Ref Range   TSH 4.025 0.350 - 4.500 uIU/mL  Comprehensive metabolic panel     Status: Abnormal   Collection Time: 03/13/15  5:31 AM  Result Value Ref Range   Sodium 143 135 - 145 mmol/L   Potassium 4.1 3.5 - 5.1 mmol/L   Chloride 105 101 - 111 mmol/L   CO2 26 22 - 32 mmol/L   Glucose, Bld 117 (H) 65 - 99 mg/dL   BUN 45 (H) 6 - 20 mg/dL   Creatinine, Ser 1.67 (H) 0.44 - 1.00 mg/dL   Calcium 9.1 8.9 - 10.3 mg/dL   Total Protein 6.7 6.5 - 8.1 g/dL   Albumin 3.6 3.5 - 5.0 g/dL   AST 21 15 - 41 U/L   ALT 14 14 - 54 U/L   Alkaline Phosphatase 51 38 - 126 U/L   Total Bilirubin 1.5 (H) 0.3 - 1.2 mg/dL   GFR   calc non Af Amer 27 (L) >60 mL/min   GFR calc Af Amer 32 (L) >60 mL/min    Comment: (NOTE) The eGFR has been calculated using the CKD EPI equation. This calculation has not been validated in all clinical situations. eGFR's persistently <60 mL/min signify possible Chronic Kidney Disease.    Anion gap 12 5 - 15  CBC     Status: Abnormal   Collection Time: 03/13/15  5:31 AM  Result Value Ref Range   WBC 6.6 4.0 - 10.5 K/uL   RBC 4.23 3.87 - 5.11 MIL/uL   Hemoglobin 12.0 12.0 - 15.0 g/dL   HCT 37.9 36.0 - 46.0 %   MCV 89.6 78.0 - 100.0 fL   MCH 28.4 26.0 - 34.0 pg   MCHC 31.7 30.0 - 36.0 g/dL   RDW 16.2 (H) 11.5 - 15.5 %   Platelets 148 (L) 150 - 400 K/uL  Protime-INR     Status: Abnormal   Collection Time: 03/13/15  5:31 AM  Result  Value Ref Range   Prothrombin Time 26.6 (H) 11.6 - 15.2 seconds   INR 2.50 (H) 0.00 - 1.49  Glucose, capillary     Status: Abnormal   Collection Time: 03/13/15  7:38 AM  Result Value Ref Range   Glucose-Capillary 114 (H) 65 - 99 mg/dL   Comment 1 Notify RN   Glucose, capillary     Status: Abnormal   Collection Time: 03/13/15 11:22 AM  Result Value Ref Range   Glucose-Capillary 116 (H) 65 - 99 mg/dL   Comment 1 Notify RN   Glucose, capillary     Status: Abnormal   Collection Time: 03/13/15  3:55 PM  Result Value Ref Range   Glucose-Capillary 158 (H) 65 - 99 mg/dL  Glucose, capillary     Status: Abnormal   Collection Time: 03/13/15  4:27 PM  Result Value Ref Range   Glucose-Capillary 141 (H) 65 - 99 mg/dL   Comment 1 Notify RN   Glucose, capillary     Status: Abnormal   Collection Time: 03/13/15  8:14 PM  Result Value Ref Range   Glucose-Capillary 140 (H) 65 - 99 mg/dL  Glucose, capillary     Status: Abnormal   Collection Time: 03/14/15 12:24 AM  Result Value Ref Range   Glucose-Capillary 109 (H) 65 - 99 mg/dL  Glucose, capillary     Status: Abnormal   Collection Time: 03/14/15  4:17 AM  Result Value Ref Range   Glucose-Capillary 116 (H) 65 - 99 mg/dL  CBC     Status: Abnormal   Collection Time: 03/14/15  5:05 AM  Result Value Ref Range   WBC 5.8 4.0 - 10.5 K/uL   RBC 4.01 3.87 - 5.11 MIL/uL   Hemoglobin 11.5 (L) 12.0 - 15.0 g/dL   HCT 36.7 36.0 - 46.0 %   MCV 91.5 78.0 - 100.0 fL   MCH 28.7 26.0 - 34.0 pg   MCHC 31.3 30.0 - 36.0 g/dL   RDW 16.1 (H) 11.5 - 15.5 %   Platelets 142 (L) 150 - 400 K/uL  Basic metabolic panel     Status: Abnormal   Collection Time: 03/14/15  5:05 AM  Result Value Ref Range   Sodium 139 135 - 145 mmol/L   Potassium 4.3 3.5 - 5.1 mmol/L   Chloride 100 (L) 101 - 111 mmol/L   CO2 27 22 - 32 mmol/L   Glucose, Bld 118 (H) 65 - 99 mg/dL   BUN 37 (H) 6 -  20 mg/dL   Creatinine, Ser 1.61 (H) 0.44 - 1.00 mg/dL   Calcium 8.9 8.9 - 10.3 mg/dL    GFR calc non Af Amer 28 (L) >60 mL/min   GFR calc Af Amer 33 (L) >60 mL/min    Comment: (NOTE) The eGFR has been calculated using the CKD EPI equation. This calculation has not been validated in all clinical situations. eGFR's persistently <60 mL/min signify possible Chronic Kidney Disease.    Anion gap 12 5 - 15  Glucose, capillary     Status: Abnormal   Collection Time: 03/14/15  7:44 AM  Result Value Ref Range   Glucose-Capillary 102 (H) 65 - 99 mg/dL  Glucose, capillary     Status: Abnormal   Collection Time: 03/14/15 12:03 PM  Result Value Ref Range   Glucose-Capillary 116 (H) 65 - 99 mg/dL    MICRO: 1/14 aspriate NGTD  IMAGING: Dg Wrist Complete Right  03/12/2015  CLINICAL DATA:  Gradually increasing RIGHT wrist pain and tenderness since Thursday, awoke with pain, no known injury, history type II diabetes mellitus, obesity, hypertension, asthma, atrial fibrillation EXAM: RIGHT WRIST - COMPLETE 3+ VIEW COMPARISON:  None FINDINGS: Bones demineralized. Degenerative changes at first [facility] joint. Minimal radiocarpal joint space narrowing. Cystic degenerative changes at lunate and triquetrum. Remain joint spaces preserved. No acute fracture, dislocation or additional bone destruction. IMPRESSION: Degenerative changes at first [facility] joint and minimally radiocarpal joint. Additional cystic degenerative changes at the lunatotriquetral joint. No other focal osseous abnormalities. Electronically Signed   By: Lavonia Dana M.D.   On: 03/12/2015 16:45    Assessment/Plan:  82yo F immunocompetent host with right wrist septic arthritis not associated with trauma,   - recommend to get central line so that she can be placed for IV vancomycin for up to 4 wk. We will see her in clinic in 2 wk to consider stopping early and keeping on oral abtx. - will d/c pip/tazo - will get baseline sed rate and crp - will follow up on culture results to see if need to change regimen.   ---------- for home health  orders-----------------  Diagnosis: Septic arthritis  Culture Result: currently no growth to date, but may change  No Known Allergies  Discharge antibiotics: Per pharmacy protocol vancomycin Duration: 4 wks End Date: Using 1/16 as a day 1, thus feb 13th is the last date of abtx. Please d/c picc line once it is done  [facility] Care Per Protocol: Labs weekly while on IV antibiotics: _x_ CBC with differential- weekly _x_ BMP - twice a week __ CRP - only last week __ ESR - only last week _x_ Vancomycin trough- twice a week  Fax weekly labs to [phone]  Clinic Follow Up Appt: 2-3 wk with Locke Barrell

## 2015-03-14 NOTE — Progress Notes (Signed)
PROGRESS NOTE  [name] UJW:[id number] DOB: 1929-10-13 DOA: 03/12/2015 PCP: Kathryn RhymesKatherine Tabori, MD   HPI: 82 yo F with DM2, HTN, admitted on 1/14 with 3 days of increasing right wrist pain / swelling without any associated trauma.   Subjective / 24 H Interval events - wrist pain improved, able to move her hand/fingers  Assessment/Plan: Active Problems:   Type II diabetes mellitus with nephropathy (HCC)   Essential hypertension   Atrial fibrillation (HCC)   Current use of long term anticoagulation   Cellulitis   Septic joint of right wrist (HCC)   CKD stage 3 due to type 2 diabetes mellitus (HCC)   Chronic diastolic CHF (congestive heart failure), NYHA class 1 (HCC)    Septic joint of right wrist (HCC)  - Dr. Izora [name] consulted in the ED, s/p aspiration on admission and OR for washout on 1/15 - ID consulted, recommend IV antibiotics for home. Discussed with Dr. Drue [name] - ask for PICC Line   Atrial fibrillation [facility](HCC)  - continue propranolol, resume Coumadin  Essential hypertension - continue propranolol ovoid rebound tachycardia  - BP well controlled today. Hold evening hydralazine due to low normal BP to avoid hypotension.  Type II diabetes mellitus with nephropathy (HCC) - sliding scale, continue, CBGs today 102-132, at goal  CKD stage 3 due to type 2 diabetes mellitus (HCC)  - currently at baseline continue to monitor avoid renal toxic medications  Chronic diastolic heart failure  - currently appears to be euvolemic avoid fluid overload, continue lasix  Dyspnea patient has chronic dyspnea with exertion has been unchanged. We'll continue to monitor would recommend prior to discharge measure pulse oximetry with ambulation  Dementia  - mild will monitor for worsening while hospitalized   Diet: Diet Carb Modified Fluid consistency:: Thin; Room service appropriate?: Yes Fluids: NS DVT Prophylaxis: SCD  Code Status: Full Code Family Communication: d/w daughter  bedside Disposition Plan: home when ready  Barriers to discharge: IV antibiotics, orthopedic consult  Consultants:  Orthopedic surgery   Procedures:  None    Antibiotics Vancomycin 1/14 >> Zosyn 1/14 >>   Studies  No results found. Objective  Filed Vitals:   03/14/15 0208 03/14/15 0645 03/14/15 1041 03/14/15 1620  BP: 125/60 119/57 136/54 119/53  Pulse: 50 48 57 50  Temp: 98 F (36.7 C) 98.1 F (36.7 C)  98.5 F (36.9 C)  TempSrc: Oral Oral  Oral  Resp: 19 18 18 18   Height:      Weight:      SpO2: 95% 97% 95% 98%    Intake/Output Summary (Last 24 hours) at 03/14/15 1807 Last data filed at 03/14/15 1100  Gross per 24 hour  Intake   1050 ml  Output      0 ml  Net   1050 ml   Filed Weights   03/12/15 2022  Weight: 76.658 kg (169 lb)   Exam:  GENERAL: NAD  HEENT: no scleral icterus, PERRL  NECK: supple, no LAD  LUNGS: CTA biL, no wheezing  HEART: RRR without MRG  ABDOMEN: soft, non tender  EXTREMITIES: no clubbing / cyanosis. Right wrist swollen, tender to palpation, tender with every movement.  NEUROLOGIC: non focal  Data Reviewed: Basic Metabolic Panel:  Recent Labs Lab 03/12/15 1625 03/13/15 0531 03/14/15 0505  NA 143 143 139  K 4.0 4.1 4.3  CL 103 105 100*  CO2  --  26 27  GLUCOSE 125* 117* 118*  BUN 38* 45* 37*  CREATININE 1.40* 1.67*  1.61*  CALCIUM  --  9.1 8.9  MG  --  2.3  --   PHOS  --  3.9  --    Liver Function Tests:  Recent Labs Lab 03/13/15 0531  AST 21  ALT 14  ALKPHOS 51  BILITOT 1.5*  PROT 6.7  ALBUMIN 3.6   CBC:  Recent Labs Lab 03/12/15 1616 03/12/15 1625 03/13/15 0531 03/14/15 0505  WBC 11.2*  --  6.6 5.8  NEUTROABS 8.4*  --   --   --   HGB 13.7 14.6 12.0 11.5*  HCT 42.0 43.0 37.9 36.7  MCV 89.0  --  89.6 91.5  PLT 158  --  148* 142*   CBG:  Recent Labs Lab 03/14/15 0024 03/14/15 0417 03/14/15 0744 03/14/15 1203 03/14/15 1659  GLUCAP 109* 116* 102* 116* 132*    Recent Results  (from the past 240 hour(s))  Body fluid culture     Status: None (Preliminary result)   Collection Time: 03/12/15  6:04 PM  Result Value Ref Range Status   Specimen Description JOINT FLUID R WRIST  Final   Special Requests Normal  Final   Gram Stain   Final    ABUNDANT WBC PRESENT, PREDOMINANTLY PMN NO ORGANISMS SEEN CRITICAL RESULT CALLED TO, READ BACK BY AND VERIFIED WITH: H PENG,RN [id number] WILDERK    Culture   Final    NO GROWTH 1 DAY Performed at [facility]    Report Status PENDING  Incomplete  MRSA PCR Screening     Status: None   Collection Time: 03/13/15 12:08 AM  Result Value Ref Range Status   MRSA by PCR NEGATIVE NEGATIVE Final    Comment:        The GeneXpert MRSA Assay (FDA approved for NASAL specimens only), is one component of a comprehensive MRSA colonization surveillance program. It is not intended to diagnose MRSA infection nor to guide or monitor treatment for MRSA infections.      Scheduled Meds: . amLODipine  10 mg Oral QHS  . docusate sodium  100 mg Oral BID  . furosemide  40 mg Oral Daily  . hydrALAZINE  25-50 mg Oral q morning - 10a  . insulin aspart  0-9 Units Subcutaneous 6 times per day  . propranolol  20 mg Oral BID  . senna  1 tablet Oral BID  . simvastatin  20 mg Oral QHS  . Tdap  0.5 mL Intramuscular Once  . vancomycin  750 mg Intravenous Q24H   Continuous Infusions:    Pamella Pert, MD [facility] Pager [phone]. If 7 PM - 7 AM, please contact night-coverage at [email or web address], password [facility] 03/14/2015, 6:07 PM  LOS: 2 days

## 2015-03-15 ENCOUNTER — Inpatient Hospital Stay (HOSPITAL_COMMUNITY): Payer: Commercial Managed Care - HMO

## 2015-03-15 DIAGNOSIS — I10 Essential (primary) hypertension: Secondary | ICD-10-CM

## 2015-03-15 LAB — PROTIME-INR
INR: 1.68 — AB (ref 0.00–1.49)
PROTHROMBIN TIME: 19.8 s — AB (ref 11.6–15.2)

## 2015-03-15 LAB — SEDIMENTATION RATE: Sed Rate: 50 mm/hr — ABNORMAL HIGH (ref 0–22)

## 2015-03-15 LAB — GLUCOSE, CAPILLARY
GLUCOSE-CAPILLARY: 117 mg/dL — AB (ref 65–99)
GLUCOSE-CAPILLARY: 126 mg/dL — AB (ref 65–99)
GLUCOSE-CAPILLARY: 113 mg/dL — AB (ref 65–99)

## 2015-03-15 LAB — VANCOMYCIN, TROUGH: VANCOMYCIN TR: 14 ug/mL (ref 10.0–20.0)

## 2015-03-15 MED ORDER — LIDOCAINE HCL 1 % IJ SOLN
INTRAMUSCULAR | Status: AC
  Filled 2015-03-15: qty 20

## 2015-03-15 MED ORDER — WARFARIN SODIUM 7.5 MG PO TABS
7.5000 mg | ORAL_TABLET | Freq: Once | ORAL | Status: AC
  Administered 2015-03-15: 7.5 mg via ORAL
  Filled 2015-03-15: qty 1

## 2015-03-15 MED ORDER — VANCOMYCIN HCL IN DEXTROSE 750-5 MG/150ML-% IV SOLN: 750.0000 mg | INTRAVENOUS | Status: DC

## 2015-03-15 NOTE — Progress Notes (Signed)
Pharmacy: Re-vancomycin  Patient's an 82 y.o F on vancomycin for septic joint with plan to treat for 4 weeks.  Vancomycin trough level now at at 14 (goal 15-20).   Plan: - Will anticipate level to be between 15-20 with accumulation of drug. Continue vancomycin 750 mg IV q24h.  Recommend this regimen discharge.  Dorna Leitz, PharmD, BCPS 03/15/2015 8:18 PM

## 2015-03-15 NOTE — Care Management Note (Signed)
Case Management Note  Patient Details  Name: [name] MRN: [id number] Date of Birth: Jan 18, 1930  Subjective/Objective:     82 yo admitted with Cellulitis               Action/Plan: Pt to DC to daughters home with [facility] for IV abx and HHPT  Expected Discharge Date:  03/14/15               Expected Discharge Plan:  Home w Home Health Services  In-House Referral:     Discharge planning Services  CM Consult  Post Acute Care Choice:  Home Health Choice offered to:  Patient  DME Arranged:  IV pump/equipment DME Agency:  [facility]  HH Arranged:  RN, PT [facility] Agency:  [facility]  Status of Service:  In process, will continue to follow  Medicare Important Message Given:  Yes Date Medicare IM Given:    Medicare IM give by:    Date Additional Medicare IM Given:    Additional Medicare Important Message give by:     If discussed at Long Length of Stay Meetings, dates discussed:    Additional Comments: This CM offered pt choice for [facility] services and pt chose [facility]. [facility] alerted of referral for PT and IV abx. Pt states she will go home with her daughter and her daughter can assist with administering IV abx.  Pt to dc home this evening after evening dose of Vanc given. No other CM needs communicated. Bartholome Bill, RN 03/15/2015, 3:59 PM

## 2015-03-15 NOTE — Progress Notes (Addendum)
ANTIBIOTIC CONSULT NOTE - follow up  Pharmacy Consult for vancomycin Indication: septic joint  No Known Allergies  Patient Measurements: Height: 5' (152.4 cm) Weight: 169 lb (76.658 kg) IBW/kg (Calculated) : 45.5 Adjusted Body Weight:   Vital Signs: Temp: 98.4 F (36.9 C) (01/17 0517) Temp Source: Oral (01/17 0517) BP: 113/45 mmHg (01/17 0517) Pulse Rate: 43 (01/17 0517) Intake/Output from previous day: 01/16 0701 - 01/17 0700 In: 510 [P.O.:360; IV Piggyback:150] Out: -  Intake/Output from this shift:    Labs:  Recent Labs  03/12/15 1616 03/12/15 1625 03/13/15 0531 03/14/15 0505  WBC 11.2*  --  6.6 5.8  HGB 13.7 14.6 12.0 11.5*  PLT 158  --  148* 142*  CREATININE  --  1.40* 1.67* 1.61*   Estimated Creatinine Clearance: 24.2 mL/min (by C-G formula based on Cr of 1.61). No results for input(s): VANCOTROUGH, VANCOPEAK, VANCORANDOM, GENTTROUGH, GENTPEAK, GENTRANDOM, TOBRATROUGH, TOBRAPEAK, TOBRARND, AMIKACINPEAK, AMIKACINTROU, AMIKACIN in the last 72 hours.   Microbiology: Recent Results (from the past 720 hour(s))  Body fluid culture     Status: None (Preliminary result)   Collection Time: 03/12/15  6:04 PM  Result Value Ref Range Status   Specimen Description JOINT FLUID R WRIST  Final   Special Requests Normal  Final   Gram Stain   Final    ABUNDANT WBC PRESENT, PREDOMINANTLY PMN NO ORGANISMS SEEN CRITICAL RESULT CALLED TO, READ BACK BY AND VERIFIED WITH: H PENG,RN [phone] WILDERK    Culture   Final    NO GROWTH 2 DAYS Performed at [facility]    Report Status PENDING  Incomplete  MRSA PCR Screening     Status: None   Collection Time: 03/13/15 12:08 AM  Result Value Ref Range Status   MRSA by PCR NEGATIVE NEGATIVE Final    Comment:        The GeneXpert MRSA Assay (FDA approved for NASAL specimens only), is one component of a comprehensive MRSA colonization surveillance program. It is not intended to diagnose MRSA infection nor to guide  or monitor treatment for MRSA infections.     Medical History: Past Medical History  Diagnosis Date  . DIABETES-TYPE 2   . Other and unspecified hyperlipidemia   . OBESITY   . HYPERTENSION   . Intrinsic asthma, unspecified   . GERD   . KNEE PAIN   . Edema   . DYSPNEA ON EXERTION   . Atrial fibrillation (HCC)     Medications:  Scheduled:  . amLODipine  10 mg Oral QHS  . docusate sodium  100 mg Oral BID  . furosemide  40 mg Oral Daily  . hydrALAZINE  25-50 mg Oral q morning - 10a  . insulin aspart  0-9 Units Subcutaneous TID AC & HS  . propranolol  20 mg Oral BID  . senna  1 tablet Oral BID  . simvastatin  20 mg Oral QHS  . Tdap  0.5 mL Intramuscular Once  . vancomycin  750 mg Intravenous Q24H  . warfarin  7.5 mg Oral ONCE-1800  . Warfarin - Pharmacist Dosing Inpatient   Does not apply q1800   Assessment: Pt is a 82 yo female who presented with 3 day history of gradually increasing right wrist pain and swelling with some redness. In ED orthopedics consult has been called for wrist joint aspiration.  Dr.Coley came to evaluate patient. Joint aspiration was done, results currently not available. Clinically Dr. Izora Ribas suspects septic joint. Will initiate IV antibiotics and admit  to medicine, if infection does not improve she'll likely need to go to OR tomorrow  (1/15) for washout.   1/14 >> vancomycin >> 1/14 >> Zosyn >>  1/16   SCr 1.61 CrCl 24  Afebrile  WBC improved, WNL  Cultures: 1/14 body fluid ( wrist): ngtd, no organisms on gram stain  Goal of Therapy:  Vancomycin trough level 15-20 mcg/ml  Plan:  Day 4 Abxs 1) With plan for patient to get PICC line and be discharged home with 4 weeks of vancomycin per ID, will go ahead and get vanc trough tonight prior to next dose of  tonight at 2200.  2) Continue vancomycin  IV q24 for now   Hessie Knows, PharmD, BCPS Pager [phone] 03/15/2015 12:26  PM     --------------------------------------------------------------------------------------------------------------------- Addendum:  A/P: Received call from RN. Goal is for patient to try and go home today and home health would like if possible to move vancomycin schedule up slightly to help them out after patient is discharge. Will move vancomycin to be scheduled q24h at 8pm. Will go ahead and reschedule vanc trough tonight for 1900 but if patient gets discharged before then, would recommend for home health to draw trough prior to tomorrow's dose  Hessie Knows, PharmD, BCPS Pager [phone] 03/15/2015 2:26 PM

## 2015-03-15 NOTE — Discharge Summary (Signed)
Physician Discharge Summary  [name] CWU:[id number] DOB: 1929/11/24 DOA: 03/12/2015  PCP: Annye Asa, MD  Admit date: 03/12/2015 Discharge date: 03/15/2015  Time spent: > 30 minutes  Recommendations for Outpatient Follow-up:  1. Follow up with Dr. Baxter Flattery in 2 weeks 2. Continue Vancomycin for total of 4 weeks. Will need to see ID in clinic prior to stopping antibiotics.  3. Instruction for Home health  Discharge antibiotics: Per pharmacy protocol vancomycin Duration: 4 wks End Date: Using 1/16 as a day 1, thus feb 13th is the last date of abtx. Please d/c picc line once it is done  [facility] Care Per Protocol: Labs weekly while on IV antibiotics: _x_ CBC with differential- weekly _x_ BMP - twice a week __ CRP - only last week __ ESR - only last week _x_ Vancomycin trough- twice a week  Fax weekly labs to [phone]  Clinic Follow Up Appt: 2-3 wk with snider  Discharge Diagnoses:  Active Problems:   Type II diabetes mellitus with nephropathy (HCC)   Essential hypertension   Atrial fibrillation (HCC)   Current use of long term anticoagulation   Cellulitis   Septic joint of right wrist (HCC)   CKD stage 3 due to type 2 diabetes mellitus (HCC)   Chronic diastolic CHF (congestive heart failure), NYHA class 1 ([place])  Discharge Condition: stable  Diet recommendation: regular  Filed Weights   03/12/15 2022  Weight: 76.658 kg (169 lb)    History of present illness:  See H&P, Labs, Consult and Test reports for all details in brief, patient is a 82 yo F with DM2, HTN, admitted on 1/14 with 3 days of increasing right wrist pain / swelling without any associated trauma.   Hospital Course:  Septic joint of right wrist [facility]) - Dr. Lenon Curt consulted in the ED, s/p aspiration on admission and OR for washout on 1/15. Cultures pending at the time of discharge. ID consulted, recommend IV antibiotics for home with Vancomycin.  Atrial fibrillation ([place])  - continue  propranolol, resume Coumadin Essential hypertension  Type II diabetes mellitus with nephropathy ([place])  CKD stage 3 due to type 2 diabetes mellitus ([place])- currently at baseline continue to monitor  Chronic diastolic heart failure - currently appears to be euvolemic avoid fluid overload, continue lasix Dyspnea patient has chronic dyspnea with exertion has been unchanged. We'll continue to monitor would recommend prior to discharge measure pulse oximetry with ambulation  Procedures:  I&D right wrist   Consultations:  Orthopedic surgery - hand  ID  Discharge Exam: Filed Vitals:   03/14/15 1620 03/14/15 2114 03/15/15 0517 03/15/15 1348  BP: 119/53 130/49 113/45 121/44  Pulse: 50 101 43 48  Temp: 98.5 F (36.9 C) 98 F (36.7 C) 98.4 F (36.9 C) 98.9 F (37.2 C)  TempSrc: Oral Oral Oral Oral  Resp: '18 18 18 18  '$ Height:      Weight:      SpO2: 98% 95% 96% 95%   General: NAD Cardiovascular: RRR Respiratory: CTA biL  Discharge Instructions Activity:  As tolerated   Get Medicines reviewed and adjusted: Please take all your medications with you for your next visit with your Primary MD  Please request your Primary MD to go over all hospital tests and procedure/radiological results at the follow up, please ask your Primary MD to get all Hospital records sent to his/her office.  If you experience worsening of your admission symptoms, develop shortness of breath, life threatening emergency, suicidal or homicidal thoughts  you must seek medical attention immediately by calling 911 or calling your MD immediately if symptoms less severe.  You must read complete instructions/literature along with all the possible adverse reactions/side effects for all the Medicines you take and that have been prescribed to you. Take any new Medicines after you have completely understood and accpet all the possible adverse reactions/side effects.   Do not drive when taking Pain medications.   Do not  take more than prescribed Pain, Sleep and Anxiety Medications  Special Instructions: If you have smoked or chewed Tobacco in the last 2 yrs please stop smoking, stop any regular Alcohol and or any Recreational drug use.  Wear Seat belts while driving.  Please note  You were cared for by a hospitalist during your hospital stay. Once you are discharged, your primary care physician will handle any further medical issues. Please note that NO REFILLS for any discharge medications will be authorized once you are discharged, as it is imperative that you return to your primary care physician (or establish a relationship with a primary care physician if you do not have one) for your aftercare needs so that they can reassess your need for medications and monitor your lab values.    Medication List    TAKE these medications        albuterol 108 (90 Base) MCG/ACT inhaler  Commonly known as:  VENTOLIN HFA  INHALE TWO PUFFS EVERY 4 HOURS AS NEEDED FOR COUGH AND  WHEEZING     amLODipine 5 MG tablet  Commonly known as:  NORVASC  Take 10 mg by mouth at bedtime.     furosemide 40 MG tablet  Commonly known as:  LASIX  Take 40 mg by mouth daily.     hydrALAZINE 25 MG tablet  Commonly known as:  APRESOLINE  Pt takes 1 tablet in the am and 2 tablets in the evening     propranolol 20 MG tablet  Commonly known as:  INDERAL  TAKE ONE TABLET BY MOUTH TWICE DAILY     simvastatin 40 MG tablet  Commonly known as:  ZOCOR  TAKE ONE-HALF TABLET BY MOUTH AT BEDTIME     Vancomycin 750 MG/150ML Soln  Commonly known as:  VANCOCIN  Inject 150 mLs (750 mg total) into the vein daily.     warfarin 3 MG tablet  Commonly known as:  COUMADIN  TAKE TWO TABLETS BY MOUTH ONCE DAILY           Follow-up Information    Follow up with [facility].   Contact information:   [address] [place] [address] [phone]       The results of significant diagnostics from this  hospitalization (including imaging, microbiology, ancillary and laboratory) are listed below for reference.    Significant Diagnostic Studies: Dg Wrist Complete Right  03/12/2015  CLINICAL DATA:  Gradually increasing RIGHT wrist pain and tenderness since Thursday, awoke with pain, no known injury, history type II diabetes mellitus, obesity, hypertension, asthma, atrial fibrillation EXAM: RIGHT WRIST - COMPLETE 3+ VIEW COMPARISON:  None FINDINGS: Bones demineralized. Degenerative changes at first [facility] joint. Minimal radiocarpal joint space narrowing. Cystic degenerative changes at lunate and triquetrum. Remain joint spaces preserved. No acute fracture, dislocation or additional bone destruction. IMPRESSION: Degenerative changes at first [facility] joint and minimally radiocarpal joint. Additional cystic degenerative changes at the lunatotriquetral joint. No other focal osseous abnormalities. Electronically Signed   By: Lavonia Dana M.D.   On: 03/12/2015 16:45  Microbiology: Recent Results (from the past 240 hour(s))  Body fluid culture     Status: None (Preliminary result)   Collection Time: 03/12/15  6:04 PM  Result Value Ref Range Status   Specimen Description JOINT FLUID R WRIST  Final   Special Requests Normal  Final   Gram Stain   Final    ABUNDANT WBC PRESENT, PREDOMINANTLY PMN NO ORGANISMS SEEN CRITICAL RESULT CALLED TO, READ BACK BY AND VERIFIED WITH: H PENG,RN [id number] 0150 WILDERK    Culture   Final    NO GROWTH 2 DAYS Performed at [facility]    Report Status PENDING  Incomplete  MRSA PCR Screening     Status: None   Collection Time: 03/13/15 12:08 AM  Result Value Ref Range Status   MRSA by PCR NEGATIVE NEGATIVE Final    Comment:        The GeneXpert MRSA Assay (FDA approved for NASAL specimens only), is one component of a comprehensive MRSA colonization surveillance program. It is not intended to diagnose MRSA infection nor to guide or monitor treatment for MRSA  infections.      Labs: Basic Metabolic Panel:  Recent Labs Lab 03/12/15 1625 03/13/15 0531 03/14/15 0505  NA 143 143 139  K 4.0 4.1 4.3  CL 103 105 100*  CO2  --  26 27  GLUCOSE 125* 117* 118*  BUN 38* 45* 37*  CREATININE 1.40* 1.67* 1.61*  CALCIUM  --  9.1 8.9  MG  --  2.3  --   PHOS  --  3.9  --    Liver Function Tests:  Recent Labs Lab 03/13/15 0531  AST 21  ALT 14  ALKPHOS 51  BILITOT 1.5*  PROT 6.7  ALBUMIN 3.6   CBC:  Recent Labs Lab 03/12/15 1616 03/12/15 1625 03/13/15 0531 03/14/15 0505  WBC 11.2*  --  6.6 5.8  NEUTROABS 8.4*  --   --   --   HGB 13.7 14.6 12.0 11.5*  HCT 42.0 43.0 37.9 36.7  MCV 89.0  --  89.6 91.5  PLT 158  --  148* 142*   CBG:  Recent Labs Lab 03/14/15 1203 03/14/15 1659 03/14/15 2113 03/15/15 0804 03/15/15 1140  GLUCAP 116* 132* 114* 117* 126*   Signed:  Amarion Portell  [facility] 03/15/2015, 4:44 PM

## 2015-03-15 NOTE — Progress Notes (Signed)
Pt needs to receive IV antibiotic prior to being D/C home.  Spoke w/ pharmacy, soonest we can give antibiotic is 2000. Plan to D/C home after dose has been given.

## 2015-03-15 NOTE — Care Management Important Message (Signed)
Important Message  Patient Details  Name: [name] MRN: [id number] Date of Birth: [birth year]   Medicare Important Message Given:  Yes    Haskell Flirt 03/15/2015, 10:13 AMImportant Message  Patient Details  Name: [name] MRN: [id number] Date of Birth: 08-Jul-1929   Medicare Important Message Given:  Yes    Haskell Flirt 03/15/2015, 10:13 AM

## 2015-03-15 NOTE — Progress Notes (Signed)
ANTICOAGULATION CONSULT NOTE - Follow Up  Pharmacy Consult for Warfarin Indication: atrial fibrillation  No Known Allergies  Patient Measurements: Height: 5' (152.4 cm) Weight: 169 lb (76.658 kg) IBW/kg (Calculated) : 45.5 Heparin Dosing Weight:   Vital Signs: Temp: 98.4 F (36.9 C) (01/17 0517) Temp Source: Oral (01/17 0517) BP: 113/45 mmHg (01/17 0517) Pulse Rate: 43 (01/17 0517)  Labs:  Recent Labs  03/12/15 1616 03/12/15 1625 03/13/15 0531 03/14/15 0505 03/15/15 0530  HGB 13.7 14.6 12.0 11.5*  --   HCT 42.0 43.0 37.9 36.7  --   PLT 158  --  148* 142*  --   LABPROT 24.5*  --  26.6*  --  19.8*  INR 2.23*  --  2.50*  --  1.68*  CREATININE  --  1.40* 1.67* 1.61*  --     Estimated Creatinine Clearance: 24.2 mL/min (by C-G formula based on Cr of 1.61).   Medical History: Past Medical History  Diagnosis Date  . DIABETES-TYPE 2   . Other and unspecified hyperlipidemia   . OBESITY   . HYPERTENSION   . Intrinsic asthma, unspecified   . GERD   . KNEE PAIN   . Edema   . DYSPNEA ON EXERTION   . Atrial fibrillation [facility])    Assessment: 39 yoF admitted with septic joint of right wrist s/p aspiration on admission and OR for washout on 1/15.  Patient is on chronic warfarin therapy PTA for atrial fibrillation which was placed on hold for procedure.  Pharmacy asked to resume 1/16 PM.  PTA warfarin dose documented as 6 mg daily.  LD 1/13.  INR 2.23 (1/14), 2.50 (1/15).  INR increased yesterday despite holding warfarin.  Patient also received 2.5 mg Vitamin K on 1/14.  No INR today but would anticipate that the INR is trending down now.  Hgb 11.5, Platelets 142.  Today, 03/15/2015  INR subtherapeutic and dropping after  warfarin last PM  No reported bleeding  Goal of Therapy:  INR 2-3   Plan:  1) Warfarin 7.5mg  today at 6pm due to dropping INR 2) Daily INR   Hessie Knows, PharmD, BCPS Pager [phone] 03/15/2015 12:21 PM

## 2015-03-15 NOTE — Discharge Instructions (Signed)
Follow with Kathryn Tabori, MD in 5-7 days ° °Please get a complete blood count and chemistry panel checked by your Primary MD at your next visit, and again as instructed by your Primary MD. Please get your medications reviewed and adjusted by your Primary MD. ° °Please request your Primary MD to go over all Hospital Tests and Procedure/Radiological results at the follow up, please get all Hospital records sent to your Prim MD by signing hospital release before you go home. ° °If you had Pneumonia of Lung problems at the Hospital: °Please get a 2 view Chest X ray done in 6-8 weeks after hospital discharge or sooner if instructed by your Primary MD. ° °If you have Congestive Heart Failure: °Please call your Cardiologist or Primary MD anytime you have any of the following symptoms:  °1) 3 pound weight gain in 24 hours or 5 pounds in 1 week  °2) shortness of breath, with or without a dry hacking cough  °3) swelling in the hands, feet or stomach  °4) if you have to sleep on extra pillows at night in order to breathe ° °Follow cardiac low salt diet and 1.5 lit/day fluid restriction. ° °If you have diabetes °Accuchecks 4 times/day, Once in AM empty stomach and then before each meal. °Log in all results and show them to your primary doctor at your next visit. °If any glucose reading is under 80 or above 300 call your primary MD immediately. ° °If you have Seizure/Convulsions/Epilepsy: °Please do not drive, operate heavy machinery, participate in activities at heights or participate in high speed sports until you have seen by Primary MD or a Neurologist and advised to do so again. ° °If you had Gastrointestinal Bleeding: °Please ask your Primary MD to check a complete blood count within one week of discharge or at your next visit. Your endoscopic/colonoscopic biopsies that are pending at the time of discharge, will also need to followed by your Primary MD. ° °Get Medicines reviewed and adjusted. °Please take all your  medications with you for your next visit with your Primary MD ° °Please request your Primary MD to go over all hospital tests and procedure/radiological results at the follow up, please ask your Primary MD to get all Hospital records sent to his/her office. ° °If you experience worsening of your admission symptoms, develop shortness of breath, life threatening emergency, suicidal or homicidal thoughts you must seek medical attention immediately by calling 911 or calling your MD immediately  if symptoms less severe. ° °You must read complete instructions/literature along with all the possible adverse reactions/side effects for all the Medicines you take and that have been prescribed to you. Take any new Medicines after you have completely understood and accpet all the possible adverse reactions/side effects.  ° °Do not drive or operate heavy machinery when taking Pain medications.  ° °Do not take more than prescribed Pain, Sleep and Anxiety Medications ° °Special Instructions: If you have smoked or chewed Tobacco  in the last 2 yrs please stop smoking, stop any regular Alcohol  and or any Recreational drug use. ° °Wear Seat belts while driving. ° °Please note °You were cared for by a hospitalist during your hospital stay. If you have any questions about your discharge medications or the care you received while you were in the hospital after you are discharged, you can call the unit and asked to speak with the hospitalist on call if the hospitalist that took care of you is not available. Once   you are discharged, your primary care physician will handle any further medical issues. Please note that NO REFILLS for any discharge medications will be authorized once you are discharged, as it is imperative that you return to your primary care physician (or establish a relationship with a primary care physician if you do not have one) for your aftercare needs so that they can reassess your need for medications and monitor your  lab values. ° °You can reach the hospitalist office at phone [phone] or fax [phone] °  °If you do not have a primary care physician, you can call [phone] for a physician referral. ° °Activity: As tolerated with Full fall precautions use walker/cane & assistance as needed ° °Diet: regular ° °Disposition Home ° ° °

## 2015-03-15 NOTE — Progress Notes (Deleted)
PROGRESS NOTE  [name] YQM:[id number] DOB: 1929-11-11 DOA: 03/12/2015 PCP: Neena Rhymes, MD   HPI: 82 yo F with DM2, HTN, admitted on 1/14 with 3 days of increasing right wrist pain / swelling without any associated trauma.   Subjective / 24 H Interval events - wrist pain significantly better  Assessment/Plan: Active Problems:   Type II diabetes mellitus with nephropathy (HCC)   Essential hypertension   Atrial fibrillation (HCC)   Current use of long term anticoagulation   Cellulitis   Septic joint of right wrist (HCC)   CKD stage 3 due to type 2 diabetes mellitus (HCC)   Chronic diastolic CHF (congestive heart failure), NYHA class 1 (HCC)    Septic joint of right wrist (HCC)  - Dr. Izora Ribas consulted in the ED, s/p aspiration on admission and OR for washout on 1/15 - ID consulted, recommend IV antibiotics for home with Vancomycin. Discussed with Dr. Drue Second - IR to place line since she had CKD 3-4 - pending IR line, patient can be discharged home, [facility] ordered and vancomycin script provided  Atrial fibrillation (HCC)  - continue propranolol, resume Coumadin  Essential hypertension - continue propranolol ovoid rebound tachycardia  - BP well controlled today. Hold evening hydralazine due to low normal BP to avoid hypotension.  Type II diabetes mellitus with nephropathy (HCC) - sliding scale, continue, CBGs today 102-132, at goal  CKD stage 3 due to type 2 diabetes mellitus (HCC)  - currently at baseline continue to monitor avoid renal toxic medications  Chronic diastolic heart failure  - currently appears to be euvolemic avoid fluid overload, continue lasix  Dyspnea patient has chronic dyspnea with exertion has been unchanged. We'll continue to monitor would recommend prior to discharge measure pulse oximetry with ambulation  Dementia  - mild will monitor for worsening while hospitalized   Diet: Diet Carb Modified Fluid consistency:: Thin; Room service  appropriate?: Yes Fluids: NS DVT Prophylaxis: SCD  Code Status: Full Code Family Communication: d/w daughter bedside Disposition Plan: home when ready  Barriers to discharge: Central line placement.  Consultants:  Orthopedic surgery   ID  Procedures:  None    Antibiotics Vancomycin 1/14 >> Zosyn 1/14 >>   Studies  No results found. Objective  Filed Vitals:   03/14/15 1620 03/14/15 2114 03/15/15 0517 03/15/15 1348  BP: 119/53 130/49 113/45 121/44  Pulse: 50 101 43 48  Temp: 98.5 F (36.9 C) 98 F (36.7 C) 98.4 F (36.9 C) 98.9 F (37.2 C)  TempSrc: Oral Oral Oral Oral  Resp: Height:      Weight:      SpO2: 98% 95% 96% 95%    Intake/Output Summary (Last 24 hours) at 03/15/15 1535 Last data filed at 03/15/15 1258  Gross per 24 hour  Intake    870 ml  Output      0 ml  Net    870 ml   Filed Weights   03/12/15 2022  Weight: 76.658 kg (169 lb)   Exam:  GENERAL: NAD  HEENT: no scleral icterus, PERRL  NECK: supple, no LAD  LUNGS: CTA biL, no wheezing  HEART: RRR without MRG  ABDOMEN: soft, non tender  EXTREMITIES: no clubbing / cyanosis. Right wrist swollen, tender to palpation, tender with every movement.  NEUROLOGIC: non focal  Data Reviewed: Basic Metabolic Panel:  Recent Labs Lab 03/12/15 1625 03/13/15 0531 03/14/15 0505  NA 143 143 139  K 4.0 4.1 4.3  CL 103  105 100*  CO2  --  26 27  GLUCOSE 125* 117* 118*  BUN 38* 45* 37*  CREATININE 1.40* 1.67* 1.61*  CALCIUM  --  9.1 8.9  MG  --  2.3  --   PHOS  --  3.9  --    Liver Function Tests:  Recent Labs Lab 03/13/15 0531  AST 21  ALT 14  ALKPHOS 51  BILITOT 1.5*  PROT 6.7  ALBUMIN 3.6   CBC:  Recent Labs Lab 03/12/15 1616 03/12/15 1625 03/13/15 0531 03/14/15 0505  WBC 11.2*  --  6.6 5.8  NEUTROABS 8.4*  --   --   --   HGB 13.7 14.6 12.0 11.5*  HCT 42.0 43.0 37.9 36.7  MCV 89.0  --  89.6 91.5  PLT 158  --  148* 142*   CBG:  Recent Labs Lab  03/14/15 1203 03/14/15 1659 03/14/15 2113 03/15/15 0804 03/15/15 1140  GLUCAP 116* 132* 114* 117* 126*    Recent Results (from the past 240 hour(s))  Body fluid culture     Status: None (Preliminary result)   Collection Time: 03/12/15  6:04 PM  Result Value Ref Range Status   Specimen Description JOINT FLUID R WRIST  Final   Special Requests Normal  Final   Gram Stain   Final    ABUNDANT WBC PRESENT, PREDOMINANTLY PMN NO ORGANISMS SEEN CRITICAL RESULT CALLED TO, READ BACK BY AND VERIFIED WITH: H PENG,RN [phone] WILDERK    Culture   Final    NO GROWTH 2 DAYS Performed at [facility]    Report Status PENDING  Incomplete  MRSA PCR Screening     Status: None   Collection Time: 03/13/15 12:08 AM  Result Value Ref Range Status   MRSA by PCR NEGATIVE NEGATIVE Final    Comment:        The GeneXpert MRSA Assay (FDA approved for NASAL specimens only), is one component of a comprehensive MRSA colonization surveillance program. It is not intended to diagnose MRSA infection nor to guide or monitor treatment for MRSA infections.      Scheduled Meds: . amLODipine  10 mg Oral QHS  . docusate sodium  100 mg Oral BID  . furosemide  40 mg Oral Daily  . hydrALAZINE  25-50 mg Oral q morning - 10a  . insulin aspart  0-9 Units Subcutaneous TID AC & HS  . propranolol  20 mg Oral BID  . senna  1 tablet Oral BID  . simvastatin  20 mg Oral QHS  . Tdap  0.5 mL Intramuscular Once  . vancomycin  750 mg Intravenous Q24H  . warfarin  7.5 mg Oral ONCE-1800  . Warfarin - Pharmacist Dosing Inpatient   Does not apply q1800   Continuous Infusions:    Pamella Pert, MD [facility] Pager [phone]. If 7 PM - 7 AM, please contact night-coverage at [email or web address], password [facility] 03/15/2015, 3:35 PM  LOS: 3 days

## 2015-03-15 NOTE — Evaluation (Signed)
Physical Therapy Evaluation Patient Details Name: [name] MRN: [id number] DOB: [birth year] Today's Date: 03/15/2015   History of Present Illness  82 yo F with DM2, HTN, admitted on 1/14 with 3 days of increasing right wrist pain and swelling without any associated trauma. Pt s/p aspiration on admission and OR for washout on 1/15  Clinical Impression  Pt admitted with above diagnosis. Pt currently with functional limitations due to the deficits listed below (see PT Problem List).  Pt will benefit from skilled PT to increase their independence and safety with mobility to allow discharge to the venue listed below.   Pt reports wrist feeling much better today. Pt plans to d/c home with her daughter for more assist upon d/c and due to PICC line.  May benefit from HHPT if pt agreeable.     Follow Up Recommendations Home health PT    Equipment Recommendations  None recommended by PT    Recommendations for Other Services       Precautions / Restrictions Precautions Precautions: Fall      Mobility  Bed Mobility Overal bed mobility: Needs Assistance Bed Mobility: Supine to Sit     Supine to sit: Min assist     General bed mobility comments: assist for trunk due to pt limiting use of R wrist   Transfers Overall transfer level: Needs assistance Equipment used: Rolling walker (2 wheeled) Transfers: Sit to/from Stand Sit to Stand: Min guard         General transfer comment: verbal cues for hand placement, pt able to use RW with R wrist   Ambulation/Gait Ambulation/Gait assistance: Min guard Ambulation Distance (Feet): 80 Feet Assistive device: Rolling walker (2 wheeled) Gait Pattern/deviations: Step-through pattern;Decreased stride length     General Gait Details: slow but steady gait, pt reports LE weakness, SpO2 monitored during gait on room air and remained in high 90s  Stairs            Wheelchair Mobility    Modified Rankin (Stroke Patients Only)        Balance Overall balance assessment:  (denies falls)                                           Pertinent Vitals/Pain Pain Assessment: No/denies pain    Home Living Family/patient expects to be discharged to:: Private residence Living Arrangements: Alone           Home Layout: One level Home Equipment: Environmental consultant - 2 wheels;Cane - single point Additional Comments: plans to d/c home with daughter - states daughters home can fit RW however her home cannot    Prior Function Level of Independence: Independent with assistive device(s)         Comments: typically uses SPC     Hand Dominance   Dominant Hand: Right    Extremity/Trunk Assessment   Upper Extremity Assessment: RUE deficits/detail RUE Deficits / Details: pt able to move fingers, slight edema present in fingers, wrist covered with ace wrap         Lower Extremity Assessment: Generalized weakness         Communication   Communication: No difficulties  Cognition Arousal/Alertness: Awake/alert Behavior During Therapy: WFL for tasks assessed/performed Overall Cognitive Status: Within Functional Limits for tasks assessed  General Comments      Exercises        Assessment/Plan    PT Assessment Patient needs continued PT services  PT Diagnosis Difficulty walking   PT Problem List Decreased strength;Decreased activity tolerance;Decreased mobility  PT Treatment Interventions DME instruction;Gait training;Functional mobility training;Patient/family education;Therapeutic activities;Therapeutic exercise   PT Goals (Current goals can be found in the Care Plan section) Acute Rehab PT Goals PT Goal Formulation: With patient Time For Goal Achievement: 03/22/15 Potential to Achieve Goals: Good    Frequency Min 3X/week   Barriers to discharge        Co-evaluation               End of Session Equipment Utilized During Treatment: Gait belt Activity  Tolerance: Patient tolerated treatment well Patient left: in chair;with call bell/phone within reach;with chair alarm set           Time: 8295-6213 PT Time Calculation (min) (ACUTE ONLY): 16 min   Charges:   PT Evaluation $PT Eval Low Complexity: 1 Procedure     PT G Codes:        Kristilyn Coltrane,[name] 03/15/2015, 12:34 PM Zenovia Jarred, PT, DPT 03/15/2015 Pager: [phone]

## 2015-03-15 NOTE — Anesthesia Postprocedure Evaluation (Signed)
Anesthesia Post Note  Patient: [name]  Procedure(s) Performed: Procedure(s) (LRB): IRRIGATION AND DEBRIDEMENT RIGHT WRIST (Right)  Patient location during evaluation: PACU Anesthesia Type: General Level of consciousness: awake and alert Pain management: pain level controlled Vital Signs Assessment: post-procedure vital signs reviewed and stable Respiratory status: spontaneous breathing Cardiovascular status: blood pressure returned to baseline Anesthetic complications: no    Last Vitals:  Filed Vitals:   03/14/15 2114 03/15/15 0517  BP: 130/49 113/45  Pulse: 101 43  Temp: 36.7 C 36.9 C  Resp: 18 18    Last Pain:  Filed Vitals:   03/15/15 0627  PainSc: 0-No pain                 Kennieth Rad

## 2015-03-15 NOTE — Procedures (Signed)
RIJV tunelled PICC SVC RA No comp/EBL 

## 2015-03-16 DIAGNOSIS — I129 Hypertensive chronic kidney disease with stage 1 through stage 4 chronic kidney disease, or unspecified chronic kidney disease: Secondary | ICD-10-CM | POA: Diagnosis not present

## 2015-03-16 DIAGNOSIS — I4891 Unspecified atrial fibrillation: Secondary | ICD-10-CM | POA: Diagnosis not present

## 2015-03-16 DIAGNOSIS — I5031 Acute diastolic (congestive) heart failure: Secondary | ICD-10-CM | POA: Diagnosis not present

## 2015-03-16 DIAGNOSIS — L0390 Cellulitis, unspecified: Secondary | ICD-10-CM | POA: Diagnosis not present

## 2015-03-16 DIAGNOSIS — F0390 Unspecified dementia without behavioral disturbance: Secondary | ICD-10-CM | POA: Diagnosis not present

## 2015-03-16 DIAGNOSIS — J45909 Unspecified asthma, uncomplicated: Secondary | ICD-10-CM | POA: Diagnosis not present

## 2015-03-16 DIAGNOSIS — M00841 Arthritis due to other bacteria, right hand: Secondary | ICD-10-CM | POA: Diagnosis not present

## 2015-03-16 DIAGNOSIS — E1122 Type 2 diabetes mellitus with diabetic chronic kidney disease: Secondary | ICD-10-CM | POA: Diagnosis not present

## 2015-03-16 DIAGNOSIS — L03113 Cellulitis of right upper limb: Secondary | ICD-10-CM | POA: Diagnosis not present

## 2015-03-16 DIAGNOSIS — M00831 Arthritis due to other bacteria, right wrist: Secondary | ICD-10-CM | POA: Diagnosis not present

## 2015-03-16 DIAGNOSIS — N183 Chronic kidney disease, stage 3 (moderate): Secondary | ICD-10-CM | POA: Diagnosis not present

## 2015-03-16 LAB — BODY FLUID CULTURE
Culture: NO GROWTH
SPECIAL REQUESTS: NORMAL

## 2015-03-17 DIAGNOSIS — I4891 Unspecified atrial fibrillation: Secondary | ICD-10-CM | POA: Diagnosis not present

## 2015-03-17 DIAGNOSIS — N183 Chronic kidney disease, stage 3 (moderate): Secondary | ICD-10-CM | POA: Diagnosis not present

## 2015-03-17 DIAGNOSIS — F0390 Unspecified dementia without behavioral disturbance: Secondary | ICD-10-CM | POA: Diagnosis not present

## 2015-03-17 DIAGNOSIS — I5031 Acute diastolic (congestive) heart failure: Secondary | ICD-10-CM | POA: Diagnosis not present

## 2015-03-17 DIAGNOSIS — J45909 Unspecified asthma, uncomplicated: Secondary | ICD-10-CM | POA: Diagnosis not present

## 2015-03-17 DIAGNOSIS — M00831 Arthritis due to other bacteria, right wrist: Secondary | ICD-10-CM | POA: Diagnosis not present

## 2015-03-17 DIAGNOSIS — I129 Hypertensive chronic kidney disease with stage 1 through stage 4 chronic kidney disease, or unspecified chronic kidney disease: Secondary | ICD-10-CM | POA: Diagnosis not present

## 2015-03-17 DIAGNOSIS — E1122 Type 2 diabetes mellitus with diabetic chronic kidney disease: Secondary | ICD-10-CM | POA: Diagnosis not present

## 2015-03-17 DIAGNOSIS — L03113 Cellulitis of right upper limb: Secondary | ICD-10-CM | POA: Diagnosis not present

## 2015-03-18 DIAGNOSIS — L03113 Cellulitis of right upper limb: Secondary | ICD-10-CM | POA: Diagnosis not present

## 2015-03-18 DIAGNOSIS — E1122 Type 2 diabetes mellitus with diabetic chronic kidney disease: Secondary | ICD-10-CM | POA: Diagnosis not present

## 2015-03-18 DIAGNOSIS — I4891 Unspecified atrial fibrillation: Secondary | ICD-10-CM | POA: Diagnosis not present

## 2015-03-18 DIAGNOSIS — I129 Hypertensive chronic kidney disease with stage 1 through stage 4 chronic kidney disease, or unspecified chronic kidney disease: Secondary | ICD-10-CM | POA: Diagnosis not present

## 2015-03-18 DIAGNOSIS — I5031 Acute diastolic (congestive) heart failure: Secondary | ICD-10-CM | POA: Diagnosis not present

## 2015-03-18 DIAGNOSIS — Z792 Long term (current) use of antibiotics: Secondary | ICD-10-CM | POA: Insufficient documentation

## 2015-03-18 DIAGNOSIS — N183 Chronic kidney disease, stage 3 (moderate): Secondary | ICD-10-CM | POA: Diagnosis not present

## 2015-03-18 DIAGNOSIS — J45909 Unspecified asthma, uncomplicated: Secondary | ICD-10-CM | POA: Diagnosis not present

## 2015-03-18 DIAGNOSIS — F0390 Unspecified dementia without behavioral disturbance: Secondary | ICD-10-CM | POA: Diagnosis not present

## 2015-03-18 DIAGNOSIS — M00831 Arthritis due to other bacteria, right wrist: Secondary | ICD-10-CM | POA: Diagnosis not present

## 2015-03-20 DIAGNOSIS — N183 Chronic kidney disease, stage 3 (moderate): Secondary | ICD-10-CM | POA: Diagnosis not present

## 2015-03-20 DIAGNOSIS — L0390 Cellulitis, unspecified: Secondary | ICD-10-CM | POA: Diagnosis not present

## 2015-03-20 DIAGNOSIS — M00841 Arthritis due to other bacteria, right hand: Secondary | ICD-10-CM | POA: Diagnosis not present

## 2015-03-22 ENCOUNTER — Telehealth: Payer: Self-pay | Admitting: Family Medicine

## 2015-03-22 ENCOUNTER — Encounter: Payer: Self-pay | Admitting: Family Medicine

## 2015-03-22 ENCOUNTER — Ambulatory Visit (INDEPENDENT_AMBULATORY_CARE_PROVIDER_SITE_OTHER): Payer: Commercial Managed Care - HMO | Admitting: Family Medicine

## 2015-03-22 ENCOUNTER — Other Ambulatory Visit: Payer: Self-pay

## 2015-03-22 VITALS — BP 144/62 | HR 51 | Temp 98.4°F | Ht 60.0 in | Wt 179.2 lb

## 2015-03-22 DIAGNOSIS — I480 Paroxysmal atrial fibrillation: Secondary | ICD-10-CM

## 2015-03-22 DIAGNOSIS — I4891 Unspecified atrial fibrillation: Secondary | ICD-10-CM | POA: Diagnosis not present

## 2015-03-22 DIAGNOSIS — M00831 Arthritis due to other bacteria, right wrist: Secondary | ICD-10-CM | POA: Diagnosis not present

## 2015-03-22 DIAGNOSIS — F0390 Unspecified dementia without behavioral disturbance: Secondary | ICD-10-CM | POA: Diagnosis not present

## 2015-03-22 DIAGNOSIS — A419 Sepsis, unspecified organism: Secondary | ICD-10-CM | POA: Diagnosis not present

## 2015-03-22 DIAGNOSIS — L03113 Cellulitis of right upper limb: Secondary | ICD-10-CM | POA: Diagnosis not present

## 2015-03-22 DIAGNOSIS — E1122 Type 2 diabetes mellitus with diabetic chronic kidney disease: Secondary | ICD-10-CM | POA: Diagnosis not present

## 2015-03-22 DIAGNOSIS — I5031 Acute diastolic (congestive) heart failure: Secondary | ICD-10-CM | POA: Diagnosis not present

## 2015-03-22 DIAGNOSIS — J45909 Unspecified asthma, uncomplicated: Secondary | ICD-10-CM | POA: Diagnosis not present

## 2015-03-22 DIAGNOSIS — I129 Hypertensive chronic kidney disease with stage 1 through stage 4 chronic kidney disease, or unspecified chronic kidney disease: Secondary | ICD-10-CM | POA: Diagnosis not present

## 2015-03-22 DIAGNOSIS — N183 Chronic kidney disease, stage 3 (moderate): Secondary | ICD-10-CM | POA: Diagnosis not present

## 2015-03-22 LAB — CBC WITH DIFFERENTIAL/PLATELET
BASOS PCT: 0.6 % (ref 0.0–3.0)
Basophils Absolute: 0 10*3/uL (ref 0.0–0.1)
EOS ABS: 0.1 10*3/uL (ref 0.0–0.7)
EOS PCT: 1.3 % (ref 0.0–5.0)
HEMATOCRIT: 36.3 % (ref 36.0–46.0)
HEMOGLOBIN: 11.7 g/dL — AB (ref 12.0–15.0)
LYMPHS ABS: 0.8 10*3/uL (ref 0.7–4.0)
Lymphocytes Relative: 10.9 % — ABNORMAL LOW (ref 12.0–46.0)
MCHC: 32.1 g/dL (ref 30.0–36.0)
MCV: 87.8 fl (ref 78.0–100.0)
MONOS PCT: 10.5 % (ref 3.0–12.0)
Monocytes Absolute: 0.8 10*3/uL (ref 0.1–1.0)
NEUTROS ABS: 5.8 10*3/uL (ref 1.4–7.7)
Neutrophils Relative %: 76.7 % (ref 43.0–77.0)
PLATELETS: 185 10*3/uL (ref 150.0–400.0)
RBC: 4.14 Mil/uL (ref 3.87–5.11)
RDW: 17.2 % — AB (ref 11.5–15.5)
WBC: 7.6 10*3/uL (ref 4.0–10.5)

## 2015-03-22 LAB — BASIC METABOLIC PANEL
BUN: 41 mg/dL — AB (ref 6–23)
CO2: 28 meq/L (ref 19–32)
Calcium: 9.4 mg/dL (ref 8.4–10.5)
Chloride: 106 mEq/L (ref 96–112)
Creatinine, Ser: 1.22 mg/dL — ABNORMAL HIGH (ref 0.40–1.20)
GFR: 44.65 mL/min — AB (ref >60.00)
GLUCOSE: 117 mg/dL — AB (ref 70–99)
POTASSIUM: 4.3 meq/L (ref 3.5–5.1)
SODIUM: 142 meq/L (ref 135–145)

## 2015-03-22 LAB — PROTIME-INR
INR: 2.1 ratio — ABNORMAL HIGH (ref 0.8–1.0)
PROTHROMBIN TIME: 22.5 s — AB (ref 9.6–13.1)

## 2015-03-22 MED ORDER — WARFARIN SODIUM 3 MG PO TABS
6.0000 mg | ORAL_TABLET | Freq: Every day | ORAL | Status: DC
Start: 2015-03-22 — End: 2016-03-05

## 2015-03-22 MED ORDER — FUROSEMIDE 40 MG PO TABS: 40.0000 mg | ORAL_TABLET | Freq: Every day | ORAL | Status: DC

## 2015-03-22 NOTE — Progress Notes (Signed)
Pre visit review using our clinic review tool, if applicable. No additional management support is needed unless otherwise documented below in the visit note. 

## 2015-03-22 NOTE — Patient Instructions (Signed)
We will notify you of your lab results and make any changes if needed Someone will call you with your ID appt w/ Dr Drue Second We are calling [facility] to get to the bottom of this Call with any questions or concerns Hang in there!!!

## 2015-03-22 NOTE — Assessment & Plan Note (Signed)
Chronic problem.  Pt appears to be in NSR today.  Has not had recent PT/INR. Stressed need for routine checks- especially while on abx.  Pt expressed understanding and is in agreement w/ plan.

## 2015-03-22 NOTE — Telephone Encounter (Signed)
Please call [facility] at [phone] and ask about Ms [name] home care services.  According to her d/c summary, she is supposed to have twice weekly BMPs and Vanc troughs as well as weekly CBC.  She has been home from the hospital x1 week and has not had lab work done.

## 2015-03-22 NOTE — Assessment & Plan Note (Signed)
New.  Pt was hospitalized and had joint clean out.  Is now on 4 weeks of IV vanc.  After reviewing pt's D/C summary, she does not have f/u scheduled w/ ID- referral placed.  She also has not had labs drawn by home health and this is supposed to be done twice weekly- and pt has been home for 7 days.  Will call home health and address this w/ them.  Will get labs (with exception of Vanc trough) today.  Plan discussed w/ pt and daughter (caregiver) and they are in agreement.

## 2015-03-22 NOTE — Progress Notes (Signed)
   Subjective:    Patient ID: [name], female    DOB: [birth year], 82 y.o.   MRN: [id number]  HPI Hospital f/u- pt was admitted 1/14-17 w/ septic arthritis of R wrist.  Had OR washout w/ Dr Izora Ribas on 1/15.  Dr Drue Second from ID recommended 4 weeks of IV Vanc- w/ end date of 2/13.  Plan is for weekly labs (CBC w/ diff, BMP, Vanc twice weekly).  It has been 1 week since d/c and no labs yet.  ([facility] [phone]).  Pt is supposed to see Dr Drue Second in 2-3 weeks- no appt scheduled.  Pt reports pain and swelling are improving compared to previous.  Pt needs CBC, BMP, PT/INR.  Pt has no record of PT check since July!   Review of Systems For ROS see HPI     Objective:   Physical Exam  Constitutional: She appears well-developed and well-nourished. No distress.  HENT:  Head: Normocephalic and atraumatic.  Cardiovascular: Normal rate, regular rhythm, normal heart sounds and intact distal pulses.   Pulmonary/Chest: Effort normal and breath sounds normal. No respiratory distress. She has no wheezes. She has no rales.  Musculoskeletal: She exhibits tenderness (mild TTP over R radial side of wrist). She exhibits no edema.  Neurological: She is alert.  Patient somewhat confused today but able to answer direct questions appropriately  Skin: Skin is warm and dry. No erythema.  Vitals reviewed.         Assessment & Plan:

## 2015-03-22 NOTE — Telephone Encounter (Signed)
Per [facility], home health RN, [facility], did IV teaching w/ pt at home visit on 1/18. RN made another home visit on 1/19  for "start of care assessment". On 1/23, HH RN called pt and scheduled appointment, but when RN arrived to the home, the pt was not home so appt was missed. Labs were not drawn at either visit. I verified lab orders w/ [facility] and reinforced the importance of having these labs drawn ASAP as pt is now 1 wk post discharge and she has not had any lab work yet. Heather at [facility] stated that she would call pt to determine when she doses the Vanc, and will set up appt for labs to be drawn either tonight or tomorrow morning depending on appropriate timing for vanc trough. She agreed to call our office and confirm once she contacted the pt.

## 2015-03-23 ENCOUNTER — Telehealth: Payer: Self-pay | Admitting: *Deleted

## 2015-03-23 ENCOUNTER — Encounter: Payer: Self-pay | Admitting: Family Medicine

## 2015-03-23 NOTE — Progress Notes (Unsigned)
Faxed copy of Labs from Hospital to Home Health per request.

## 2015-03-24 DIAGNOSIS — I5031 Acute diastolic (congestive) heart failure: Secondary | ICD-10-CM | POA: Diagnosis not present

## 2015-03-24 DIAGNOSIS — M00831 Arthritis due to other bacteria, right wrist: Secondary | ICD-10-CM | POA: Diagnosis not present

## 2015-03-24 DIAGNOSIS — N183 Chronic kidney disease, stage 3 (moderate): Secondary | ICD-10-CM | POA: Diagnosis not present

## 2015-03-24 DIAGNOSIS — J45909 Unspecified asthma, uncomplicated: Secondary | ICD-10-CM | POA: Diagnosis not present

## 2015-03-24 DIAGNOSIS — I129 Hypertensive chronic kidney disease with stage 1 through stage 4 chronic kidney disease, or unspecified chronic kidney disease: Secondary | ICD-10-CM | POA: Diagnosis not present

## 2015-03-24 DIAGNOSIS — I4891 Unspecified atrial fibrillation: Secondary | ICD-10-CM | POA: Diagnosis not present

## 2015-03-24 DIAGNOSIS — E1122 Type 2 diabetes mellitus with diabetic chronic kidney disease: Secondary | ICD-10-CM | POA: Diagnosis not present

## 2015-03-24 DIAGNOSIS — L03113 Cellulitis of right upper limb: Secondary | ICD-10-CM | POA: Diagnosis not present

## 2015-03-24 DIAGNOSIS — F0390 Unspecified dementia without behavioral disturbance: Secondary | ICD-10-CM | POA: Diagnosis not present

## 2015-03-25 ENCOUNTER — Telehealth: Payer: Self-pay

## 2015-03-25 ENCOUNTER — Encounter: Payer: Self-pay | Admitting: General Practice

## 2015-03-25 ENCOUNTER — Encounter: Payer: Self-pay | Admitting: Family Medicine

## 2015-03-25 DIAGNOSIS — M00841 Arthritis due to other bacteria, right hand: Secondary | ICD-10-CM | POA: Diagnosis not present

## 2015-03-25 DIAGNOSIS — L0390 Cellulitis, unspecified: Secondary | ICD-10-CM | POA: Diagnosis not present

## 2015-03-25 DIAGNOSIS — N183 Chronic kidney disease, stage 3 (moderate): Secondary | ICD-10-CM | POA: Diagnosis not present

## 2015-03-25 DIAGNOSIS — M19039 Primary osteoarthritis, unspecified wrist: Secondary | ICD-10-CM

## 2015-03-25 NOTE — Telephone Encounter (Signed)
Per Home Health,patient has changed her mind regarding PT. Per Dr. Beverely Low ok for this. Home Health notified.

## 2015-03-28 DIAGNOSIS — I5031 Acute diastolic (congestive) heart failure: Secondary | ICD-10-CM | POA: Diagnosis not present

## 2015-03-28 DIAGNOSIS — L03113 Cellulitis of right upper limb: Secondary | ICD-10-CM | POA: Diagnosis not present

## 2015-03-28 DIAGNOSIS — M00831 Arthritis due to other bacteria, right wrist: Secondary | ICD-10-CM | POA: Diagnosis not present

## 2015-03-28 DIAGNOSIS — E1122 Type 2 diabetes mellitus with diabetic chronic kidney disease: Secondary | ICD-10-CM | POA: Diagnosis not present

## 2015-03-28 DIAGNOSIS — F0390 Unspecified dementia without behavioral disturbance: Secondary | ICD-10-CM | POA: Diagnosis not present

## 2015-03-28 DIAGNOSIS — I4891 Unspecified atrial fibrillation: Secondary | ICD-10-CM | POA: Diagnosis not present

## 2015-03-28 DIAGNOSIS — N183 Chronic kidney disease, stage 3 (moderate): Secondary | ICD-10-CM | POA: Diagnosis not present

## 2015-03-28 DIAGNOSIS — J45909 Unspecified asthma, uncomplicated: Secondary | ICD-10-CM | POA: Diagnosis not present

## 2015-03-28 DIAGNOSIS — I129 Hypertensive chronic kidney disease with stage 1 through stage 4 chronic kidney disease, or unspecified chronic kidney disease: Secondary | ICD-10-CM | POA: Diagnosis not present

## 2015-03-30 DIAGNOSIS — M00831 Arthritis due to other bacteria, right wrist: Secondary | ICD-10-CM | POA: Diagnosis not present

## 2015-03-30 DIAGNOSIS — I5031 Acute diastolic (congestive) heart failure: Secondary | ICD-10-CM | POA: Diagnosis not present

## 2015-03-30 DIAGNOSIS — L03113 Cellulitis of right upper limb: Secondary | ICD-10-CM | POA: Diagnosis not present

## 2015-03-30 DIAGNOSIS — I4891 Unspecified atrial fibrillation: Secondary | ICD-10-CM | POA: Diagnosis not present

## 2015-03-30 DIAGNOSIS — F0390 Unspecified dementia without behavioral disturbance: Secondary | ICD-10-CM | POA: Diagnosis not present

## 2015-03-30 DIAGNOSIS — J45909 Unspecified asthma, uncomplicated: Secondary | ICD-10-CM | POA: Diagnosis not present

## 2015-03-30 DIAGNOSIS — I129 Hypertensive chronic kidney disease with stage 1 through stage 4 chronic kidney disease, or unspecified chronic kidney disease: Secondary | ICD-10-CM | POA: Diagnosis not present

## 2015-03-30 DIAGNOSIS — E1122 Type 2 diabetes mellitus with diabetic chronic kidney disease: Secondary | ICD-10-CM | POA: Diagnosis not present

## 2015-03-30 DIAGNOSIS — N183 Chronic kidney disease, stage 3 (moderate): Secondary | ICD-10-CM | POA: Diagnosis not present

## 2015-04-02 DIAGNOSIS — L0390 Cellulitis, unspecified: Secondary | ICD-10-CM | POA: Diagnosis not present

## 2015-04-02 DIAGNOSIS — M00841 Arthritis due to other bacteria, right hand: Secondary | ICD-10-CM | POA: Diagnosis not present

## 2015-04-02 DIAGNOSIS — N183 Chronic kidney disease, stage 3 (moderate): Secondary | ICD-10-CM | POA: Diagnosis not present

## 2015-04-04 ENCOUNTER — Telehealth: Payer: Self-pay | Admitting: Family Medicine

## 2015-04-04 ENCOUNTER — Encounter: Payer: Self-pay | Admitting: Family Medicine

## 2015-04-04 DIAGNOSIS — L03113 Cellulitis of right upper limb: Secondary | ICD-10-CM | POA: Diagnosis not present

## 2015-04-04 DIAGNOSIS — F0390 Unspecified dementia without behavioral disturbance: Secondary | ICD-10-CM | POA: Diagnosis not present

## 2015-04-04 DIAGNOSIS — M00831 Arthritis due to other bacteria, right wrist: Secondary | ICD-10-CM | POA: Diagnosis not present

## 2015-04-04 DIAGNOSIS — J45909 Unspecified asthma, uncomplicated: Secondary | ICD-10-CM | POA: Diagnosis not present

## 2015-04-04 DIAGNOSIS — N183 Chronic kidney disease, stage 3 (moderate): Secondary | ICD-10-CM | POA: Diagnosis not present

## 2015-04-04 DIAGNOSIS — E1122 Type 2 diabetes mellitus with diabetic chronic kidney disease: Secondary | ICD-10-CM | POA: Diagnosis not present

## 2015-04-04 DIAGNOSIS — I4891 Unspecified atrial fibrillation: Secondary | ICD-10-CM | POA: Diagnosis not present

## 2015-04-04 DIAGNOSIS — I129 Hypertensive chronic kidney disease with stage 1 through stage 4 chronic kidney disease, or unspecified chronic kidney disease: Secondary | ICD-10-CM | POA: Diagnosis not present

## 2015-04-04 DIAGNOSIS — I5031 Acute diastolic (congestive) heart failure: Secondary | ICD-10-CM | POA: Diagnosis not present

## 2015-04-04 NOTE — Telephone Encounter (Signed)
[name] with [facility] called. Pt HR 41 bpm irregular @ rest. Transferred to karen.

## 2015-04-04 NOTE — Telephone Encounter (Signed)
Per [facility], States she is at patients home and Resting HR = 41 BPM. States patient is asymptomatic at this time. Per Dr. Beverely Low have patient hold Propanolol until further notice and recheck vitals upon nurses return on Wednesday.

## 2015-04-05 ENCOUNTER — Telehealth: Payer: Self-pay

## 2015-04-05 ENCOUNTER — Telehealth: Payer: Self-pay | Admitting: Family Medicine

## 2015-04-05 DIAGNOSIS — M00831 Arthritis due to other bacteria, right wrist: Secondary | ICD-10-CM | POA: Diagnosis not present

## 2015-04-05 DIAGNOSIS — F0390 Unspecified dementia without behavioral disturbance: Secondary | ICD-10-CM | POA: Diagnosis not present

## 2015-04-05 DIAGNOSIS — I129 Hypertensive chronic kidney disease with stage 1 through stage 4 chronic kidney disease, or unspecified chronic kidney disease: Secondary | ICD-10-CM | POA: Diagnosis not present

## 2015-04-05 DIAGNOSIS — J45909 Unspecified asthma, uncomplicated: Secondary | ICD-10-CM | POA: Diagnosis not present

## 2015-04-05 DIAGNOSIS — I4891 Unspecified atrial fibrillation: Secondary | ICD-10-CM | POA: Diagnosis not present

## 2015-04-05 DIAGNOSIS — N183 Chronic kidney disease, stage 3 (moderate): Secondary | ICD-10-CM | POA: Diagnosis not present

## 2015-04-05 DIAGNOSIS — I5031 Acute diastolic (congestive) heart failure: Secondary | ICD-10-CM | POA: Diagnosis not present

## 2015-04-05 DIAGNOSIS — L03113 Cellulitis of right upper limb: Secondary | ICD-10-CM | POA: Diagnosis not present

## 2015-04-05 DIAGNOSIS — E1122 Type 2 diabetes mellitus with diabetic chronic kidney disease: Secondary | ICD-10-CM | POA: Diagnosis not present

## 2015-04-05 NOTE — Telephone Encounter (Signed)
Potassium 6.3, no history of previous hyperkalemia, patient is on Lasix, propranolol is on hold. Not on ACE inhibitor-ARBs or potassium supplements to my knowledge. Plan: Call advise home care, repeat a BMP stat DX hyperkalemia.  Call w/ results. If there is any distress or problems: Go to the ER

## 2015-04-05 NOTE — Telephone Encounter (Addendum)
Spoke with [name] with [facility]. Given orders to repeat BMP Stat. DX: Hyperkalemia. Call report to this office. Have Patient taken to ER if she becomes symptomatic. Ms [name] repeated orders back to me. Agreed.

## 2015-04-05 NOTE — Telephone Encounter (Signed)
Received call today with patients Critical Potassium Level of 6.3. Forwarded information to Dr. Drue Novel who is Doc of the Day. Also advised of patients pulse today of 41 after Dr. Beverely Low placed propanolol on hold for pulse being 41 on yesterday.

## 2015-04-05 NOTE — Telephone Encounter (Signed)
error:[id number] ° °

## 2015-04-05 NOTE — Telephone Encounter (Signed)
Received call from [facility] with [facility]. Patients Potassium today = (6.3). Patients Propanolol put on hold yesterday by Dr. Beverely Low due to patient having pulse of 41. Patients pulse today is also 41 per Home Health Nurse. Please advise.

## 2015-04-06 ENCOUNTER — Encounter: Payer: Self-pay | Admitting: Family Medicine

## 2015-04-06 ENCOUNTER — Telehealth: Payer: Self-pay

## 2015-04-06 DIAGNOSIS — I129 Hypertensive chronic kidney disease with stage 1 through stage 4 chronic kidney disease, or unspecified chronic kidney disease: Secondary | ICD-10-CM | POA: Diagnosis not present

## 2015-04-06 DIAGNOSIS — F0390 Unspecified dementia without behavioral disturbance: Secondary | ICD-10-CM | POA: Diagnosis not present

## 2015-04-06 DIAGNOSIS — N183 Chronic kidney disease, stage 3 (moderate): Secondary | ICD-10-CM | POA: Diagnosis not present

## 2015-04-06 DIAGNOSIS — L03113 Cellulitis of right upper limb: Secondary | ICD-10-CM | POA: Diagnosis not present

## 2015-04-06 DIAGNOSIS — I4891 Unspecified atrial fibrillation: Secondary | ICD-10-CM | POA: Diagnosis not present

## 2015-04-06 DIAGNOSIS — E1122 Type 2 diabetes mellitus with diabetic chronic kidney disease: Secondary | ICD-10-CM | POA: Diagnosis not present

## 2015-04-06 DIAGNOSIS — I5031 Acute diastolic (congestive) heart failure: Secondary | ICD-10-CM | POA: Diagnosis not present

## 2015-04-06 DIAGNOSIS — J45909 Unspecified asthma, uncomplicated: Secondary | ICD-10-CM | POA: Diagnosis not present

## 2015-04-06 DIAGNOSIS — M00831 Arthritis due to other bacteria, right wrist: Secondary | ICD-10-CM | POA: Diagnosis not present

## 2015-04-06 NOTE — Telephone Encounter (Signed)
Repeat labs came back as normal today.  No cause for alarm at this time

## 2015-04-06 NOTE — Telephone Encounter (Signed)
Per [facility] with [facility], patients VS = 142/62,41,95%,98.6. Per [facility] Propanolol was only held during the evening. Per Dr. Beverely Low this medication was to be held totally. Per Nurse patient will call back tomorrow with pulse after totally stopping Propanolol.

## 2015-04-09 ENCOUNTER — Encounter (HOSPITAL_COMMUNITY): Payer: Self-pay | Admitting: Oncology

## 2015-04-09 ENCOUNTER — Emergency Department (HOSPITAL_COMMUNITY)
Admission: EM | Admit: 2015-04-09 | Discharge: 2015-04-10 | Disposition: A | Discharge status: Home / Self Care | Payer: Commercial Managed Care - HMO | Attending: Emergency Medicine | Admitting: Emergency Medicine

## 2015-04-09 DIAGNOSIS — M109 Gout, unspecified: Secondary | ICD-10-CM | POA: Insufficient documentation

## 2015-04-09 DIAGNOSIS — N183 Chronic kidney disease, stage 3 (moderate): Secondary | ICD-10-CM | POA: Diagnosis not present

## 2015-04-09 DIAGNOSIS — Z792 Long term (current) use of antibiotics: Secondary | ICD-10-CM | POA: Insufficient documentation

## 2015-04-09 DIAGNOSIS — M25531 Pain in right wrist: Secondary | ICD-10-CM | POA: Diagnosis not present

## 2015-04-09 DIAGNOSIS — J45909 Unspecified asthma, uncomplicated: Secondary | ICD-10-CM | POA: Insufficient documentation

## 2015-04-09 DIAGNOSIS — M00841 Arthritis due to other bacteria, right hand: Secondary | ICD-10-CM | POA: Diagnosis not present

## 2015-04-09 DIAGNOSIS — E669 Obesity, unspecified: Secondary | ICD-10-CM | POA: Insufficient documentation

## 2015-04-09 DIAGNOSIS — E785 Hyperlipidemia, unspecified: Secondary | ICD-10-CM | POA: Diagnosis not present

## 2015-04-09 DIAGNOSIS — E119 Type 2 diabetes mellitus without complications: Secondary | ICD-10-CM | POA: Insufficient documentation

## 2015-04-09 DIAGNOSIS — I10 Essential (primary) hypertension: Secondary | ICD-10-CM | POA: Diagnosis not present

## 2015-04-09 DIAGNOSIS — Z8719 Personal history of other diseases of the digestive system: Secondary | ICD-10-CM | POA: Diagnosis not present

## 2015-04-09 DIAGNOSIS — L0390 Cellulitis, unspecified: Secondary | ICD-10-CM | POA: Diagnosis not present

## 2015-04-09 DIAGNOSIS — Z79899 Other long term (current) drug therapy: Secondary | ICD-10-CM | POA: Diagnosis not present

## 2015-04-09 NOTE — ED Notes (Signed)
Pt recently tx for cellulitis to the right arm.  Pt currently on vancomycin infusion at home via central line.  Right hand and forearm are warm, red, painful and have mild edema.

## 2015-04-10 DIAGNOSIS — M25531 Pain in right wrist: Secondary | ICD-10-CM | POA: Diagnosis not present

## 2015-04-10 LAB — C-REACTIVE PROTEIN: CRP: 5.3 mg/dL — ABNORMAL HIGH (ref <1.0)

## 2015-04-10 LAB — CBC WITH DIFFERENTIAL/PLATELET
BASOS PCT: 0 %
Basophils Absolute: 0 10*3/uL (ref 0.0–0.1)
EOS ABS: 0 10*3/uL (ref 0.0–0.7)
EOS PCT: 1 %
HCT: 39 % (ref 36.0–46.0)
Hemoglobin: 12.3 g/dL (ref 12.0–15.0)
Lymphocytes Relative: 16 %
Lymphs Abs: 1.3 10*3/uL (ref 0.7–4.0)
MCH: 28.5 pg (ref 26.0–34.0)
MCHC: 31.5 g/dL (ref 30.0–36.0)
MCV: 90.3 fL (ref 78.0–100.0)
MONO ABS: 1.2 10*3/uL — AB (ref 0.1–1.0)
MONOS PCT: 15 %
NEUTROS PCT: 68 %
Neutro Abs: 5.6 10*3/uL (ref 1.7–7.7)
Platelets: 182 10*3/uL (ref 150–400)
RBC: 4.32 MIL/uL (ref 3.87–5.11)
RDW: 16.8 % — ABNORMAL HIGH (ref 11.5–15.5)
WBC: 8.1 10*3/uL (ref 4.0–10.5)

## 2015-04-10 LAB — COMPREHENSIVE METABOLIC PANEL
ALK PHOS: 66 U/L (ref 38–126)
ALT: 29 U/L (ref 14–54)
AST: 22 U/L (ref 15–41)
Albumin: 3.9 g/dL (ref 3.5–5.0)
Anion gap: 12 (ref 5–15)
BUN: 30 mg/dL — AB (ref 6–20)
CALCIUM: 9.3 mg/dL (ref 8.9–10.3)
CO2: 29 mmol/L (ref 22–32)
CREATININE: 1.28 mg/dL — AB (ref 0.44–1.00)
Chloride: 102 mmol/L (ref 101–111)
GFR calc non Af Amer: 38 mL/min — ABNORMAL LOW (ref >60)
GFR, EST AFRICAN AMERICAN: 44 mL/min — AB (ref >60)
GLUCOSE: 123 mg/dL — AB (ref 65–99)
Potassium: 3.5 mmol/L (ref 3.5–5.1)
SODIUM: 143 mmol/L (ref 135–145)
Total Bilirubin: 1.7 mg/dL — ABNORMAL HIGH (ref 0.3–1.2)
Total Protein: 7.4 g/dL (ref 6.5–8.1)

## 2015-04-10 LAB — URIC ACID: Uric Acid, Serum: 9.7 mg/dL — ABNORMAL HIGH (ref 2.3–6.6)

## 2015-04-10 LAB — SEDIMENTATION RATE: SED RATE: 40 mm/h — AB (ref 0–22)

## 2015-04-10 MED ORDER — COLCHICINE 0.6 MG PO TABS
0.6000 mg | ORAL_TABLET | Freq: Once | ORAL | Status: AC
  Administered 2015-04-10: 0.6 mg via ORAL
  Filled 2015-04-10: qty 1

## 2015-04-10 MED ORDER — BETAMETHASONE SOD PHOS & ACET 6 (3-3) MG/ML IJ SUSP
12.0000 mg | Freq: Once | INTRAMUSCULAR | Status: AC
  Administered 2015-04-10: 12 mg via INTRA_ARTICULAR
  Filled 2015-04-10: qty 2

## 2015-04-10 MED ORDER — LIDOCAINE HCL 1 % IJ SOLN
INTRAMUSCULAR | Status: AC
  Filled 2015-04-10: qty 20

## 2015-04-10 MED ORDER — HYDROCODONE-ACETAMINOPHEN 5-325 MG PO TABS
1.0000 | ORAL_TABLET | Freq: Once | ORAL | Status: AC
  Administered 2015-04-10: 1 via ORAL
  Filled 2015-04-10: qty 1

## 2015-04-10 MED ORDER — COLCHICINE 0.6 MG PO TABS: 0.6000 mg | ORAL_TABLET | Freq: Two times a day (BID) | ORAL | Status: DC

## 2015-04-10 MED ORDER — HYDROCODONE-ACETAMINOPHEN 5-325 MG PO TABS: 1.0000 | ORAL_TABLET | Freq: Four times a day (QID) | ORAL | Status: DC | PRN

## 2015-04-10 NOTE — Discharge Instructions (Signed)
Elevate your arm. Try ice or heat for comfort. Take the medications as prescribed. Finish your antibiotics. Call Dr Debby Bud office to have him recheck you in the office this week. Return to the ED if you get a fever, you get more swollen or painful. Look at the low purine diet to help prevent further episodes of the gout.  Gout Gout is when your joints become red, sore, and swell (inflamed). This is caused by the buildup of uric acid crystals in the joints. Uric acid is a chemical that is normally in the blood. If the level of uric acid gets too high in the blood, these crystals form in your joints and tissues. Over time, these crystals can form into masses near the joints and tissues. These masses can destroy bone and cause the bone to look misshapen (deformed). HOME CARE   Do not take aspirin for pain.  Only take medicine as told by your doctor.  Rest the joint as much as you can. When in bed, keep sheets and blankets off painful areas.  Keep the sore joints raised (elevated).  Put warm or cold packs on painful joints. Use of warm or cold packs depends on which works best for you.  Use crutches if the painful joint is in your leg.  Drink enough fluids to keep your pee (urine) clear or pale yellow. Limit alcohol, sugary drinks, and drinks with fructose in them.  Follow your diet instructions. Pay careful attention to how much protein you eat. Include fruits, vegetables, whole grains, and fat-free or low-fat milk products in your daily diet. Talk to your doctor or dietitian about the use of coffee, vitamin C, and cherries. These may help lower uric acid levels.  Keep a healthy body weight. GET HELP RIGHT AWAY IF:   You have watery poop (diarrhea), throw up (vomit), or have any side effects from medicines.  You do not feel better in 24 hours, or you are getting worse.  Your joint becomes suddenly more tender, and you have chills or a fever. MAKE SURE YOU:   Understand these  instructions.  Will watch your condition.  Will get help right away if you are not doing well or get worse.   This information is not intended to replace advice given to you by your health care provider. Make sure you discuss any questions you have with your health care provider.   Document Released: 11/22/2007 Document Revised: 03/05/2014 Document Reviewed: 09/26/2011 Elsevier Interactive Patient Education 2016 Elsevier Inc.  Cryotherapy Cryotherapy is when you put ice on your injury. Ice helps lessen pain and puffiness (swelling) after an injury. Ice works the best when you start using it in the first 24 to 48 hours after an injury. HOME CARE  Put a dry or damp towel between the ice pack and your skin.  You may press gently on the ice pack.  Leave the ice on for no more than 10 to 20 minutes at a time.  Check your skin after 5 minutes to make sure your skin is okay.  Rest at least 20 minutes between ice pack uses.  Stop using ice when your skin loses feeling (numbness).  Do not use ice on someone who cannot tell you when it hurts. This includes small children and people with memory problems (dementia). GET HELP RIGHT AWAY IF:  You have white spots on your skin.  Your skin turns blue or pale.  Your skin feels waxy or hard.  Your puffiness gets worse.  MAKE SURE YOU:   Understand these instructions.  Will watch your condition.  Will get help right away if you are not doing well or get worse.   This information is not intended to replace advice given to you by your health care provider. Make sure you discuss any questions you have with your health care provider.   Document Released: 08/01/2007 Document Revised: 05/07/2011 Document Reviewed: 10/05/2010 Elsevier Interactive Patient Education 2016 Elsevier Inc.  [employer] Therapy Heat therapy can help ease sore, stiff, injured, and tight muscles and joints. Heat relaxes your muscles, which may help ease your pain. Heat  therapy should only be used on old, pre-existing, or long-lasting (chronic) injuries. Do not use heat therapy unless told by your doctor. HOW TO USE HEAT THERAPY There are several different kinds of heat therapy, including:  Moist heat pack.  Warm water bath.  Hot water bottle.  Electric heating pad.  Heated gel pack.  Heated wrap.  Electric heating pad. GENERAL HEAT THERAPY RECOMMENDATIONS   Do not sleep while using heat therapy. Only use heat therapy while you are awake.  Your skin may turn pink while using heat therapy. Do not use heat therapy if your skin turns red.  Do not use heat therapy if you have new pain.  High heat or long exposure to heat can cause burns. Be careful when using heat therapy to avoid burning your skin.  Do not use heat therapy on areas of your skin that are already irritated, such as with a rash or sunburn. GET HELP IF:   You have blisters, redness, swelling (puffiness), or numbness.  You have new pain.  Your pain is worse. MAKE SURE YOU:  Understand these instructions.  Will watch your condition.  Will get help right away if you are not doing well or get worse.   This information is not intended to replace advice given to you by your health care provider. Make sure you discuss any questions you have with your health care provider.   Document Released: 05/07/2011 Document Revised: 03/05/2014 Document Reviewed: 04/07/2013 Elsevier Interactive Patient Education 2016 Elsevier Inc.  Low-Purine Diet Purines are compounds that affect the level of uric acid in your body. A low-purine diet is a diet that is low in purines. Eating a low-purine diet can prevent the level of uric acid in your body from getting too high and causing gout or kidney stones or both. WHAT DO I NEED TO KNOW ABOUT THIS DIET?  Choose low-purine foods. Examples of low-purine foods are listed in the next section.  Drink plenty of fluids, especially water. Fluids can help  remove uric acid from your body. Try to drink 8-16 cups (1.9-3.8 L) a day.  Limit foods high in fat, especially saturated fat, as fat makes it harder for the body to get rid of uric acid. Foods high in saturated fat include pizza, cheese, ice cream, whole milk, fried foods, and gravies. Choose foods that are lower in fat and lean sources of protein. Use olive oil when cooking as it contains healthy fats that are not high in saturated fat.  Limit alcohol. Alcohol interferes with the elimination of uric acid from your body. If you are having a gout attack, avoid all alcohol.  Keep in mind that different people's bodies react differently to different foods. You will probably learn over time which foods do or do not affect you. If you discover that a food tends to cause your gout to flare up, avoid eating  that food. You can more freely enjoy foods that do not cause problems. If you have any questions about a food item, talk to your dietitian or health care provider. WHICH FOODS ARE LOW, MODERATE, AND HIGH IN PURINES? The following is a list of foods that are low, moderate, and high in purines. You can eat any amount of the foods that are low in purines. You may be able to have small amounts of foods that are moderate in purines. Ask your health care provider how much of a food moderate in purines you can have. Avoid foods high in purines. Grains  Foods low in purines: Enriched white bread, pasta, rice, cake, cornbread, popcorn.  Foods moderate in purines: Whole-grain breads and cereals, wheat germ, bran, oatmeal. Uncooked oatmeal. Dry wheat bran or wheat germ.  Foods high in purines: Pancakes, [place] toast, biscuits, muffins. Vegetables  Foods low in purines: All vegetables, except those that are moderate in purines.  Foods moderate in purines: Asparagus, cauliflower, spinach, mushrooms, green peas. Fruits  All fruits are low in purines. Meats and other Protein Foods  Foods low in purines:  Eggs, nuts, peanut butter.  Foods moderate in purines: 80-90% lean beef, lamb, veal, pork, poultry, fish, eggs, peanut butter, nuts. Crab, lobster, oysters, and shrimp. Cooked dried beans, peas, and lentils.  Foods high in purines: Anchovies, sardines, herring, mussels, tuna, codfish, scallops, trout, and haddock. [name]. Organ meats (such as liver or kidney). Tripe. Game meat. Goose. Sweetbreads. Dairy  All dairy foods are low in purines. Low-fat and fat-free dairy products are best because they are low in saturated fat. Beverages  Drinks low in purines: Water, carbonated beverages, tea, coffee, cocoa.  Drinks moderate in purines: Soft drinks and other drinks sweetened with high-fructose corn syrup. Juices. To find whether a food or drink is sweetened with high-fructose corn syrup, look at the ingredients list.  Drinks high in purines: Alcoholic beverages (such as beer). Condiments  Foods low in purines: Salt, herbs, olives, pickles, relishes, vinegar.  Foods moderate in purines: Butter, margarine, oils, mayonnaise. Fats and Oils  Foods low in purines: All types, except gravies and sauces made with meat.  Foods high in purines: Gravies and sauces made with meat. Other Foods  Foods low in purines: Sugars, sweets, gelatin. Cake. Soups made without meat.  Foods moderate in purines: Meat-based or fish-based soups, broths, or bouillons. Foods and drinks sweetened with high-fructose corn syrup.  Foods high in purines: High-fat desserts (such as ice cream, cookies, cakes, pies, doughnuts, and chocolate). Contact your dietitian for more information on foods that are not listed here.   This information is not intended to replace advice given to you by your health care provider. Make sure you discuss any questions you have with your health care provider.   Document Released: 06/09/2010 Document Revised: 02/17/2013 Document Reviewed: 01/19/2013 Elsevier Interactive Patient Education [employer].

## 2015-04-10 NOTE — Consult Note (Signed)
Reason for Consult: Patient presents with right wrist pain Referring Physician: ER staff  [name] is an 82 y.o. female.  HPI: A 43 female with right wrist pain. History is well detailed. I was asked to see her in the absence of Dr. Melvyn Novas. This patient is a pleasant female who's had a 4 week history of IV vancomycin secondary to a presumed wrist infection. She had a wrist swelling episode graded 4 weeks ago. She had aspiration which did not gravid culture positive bacteria. She was presumably  treated with vancomycin IV.  She is now nearing 4 weeks of vancomycin and his had a surgical I and D by Dr. Lenon Curt. She is developed swelling and wrist she is here with her family.  I was asked to see her by Dr. Tomi Bamberger.  She denies other issues. She denies a history of gout or pseudogout.    Past Medical History  Diagnosis Date  . DIABETES-TYPE 2   . Other and unspecified hyperlipidemia   . OBESITY   . HYPERTENSION   . Intrinsic asthma, unspecified   . GERD   . KNEE PAIN   . Edema   . DYSPNEA ON EXERTION   . Atrial fibrillation [facility])     Past Surgical History  Procedure Laterality Date  . Abdominal hysterectomy    . I&d extremity Right 03/13/2015    Procedure: IRRIGATION AND DEBRIDEMENT RIGHT WRIST;  Surgeon: Dayna Barker, MD;  Location: [facility] ORS;  Service: Plastics;  Laterality: Right;    Family History  Problem Relation Age of Onset  . Coronary artery disease Mother   . Hypertension Mother   . Hypertension Father   . Prostate cancer Father     Social History:  reports that she has never smoked. She has never used smokeless tobacco. She reports that she does not drink alcohol or use illicit drugs.  Allergies: No Known Allergies  Medications: I have reviewed the patient's current medications.  Results for orders placed or performed during the hospital encounter of 04/09/15 (from the past 48 hour(s))  Comprehensive metabolic panel     Status: Abnormal   Collection Time:  04/10/15 12:55 AM  Result Value Ref Range   Sodium 143 135 - 145 mmol/L   Potassium 3.5 3.5 - 5.1 mmol/L   Chloride 102 101 - 111 mmol/L   CO2 29 22 - 32 mmol/L   Glucose, Bld 123 (H) 65 - 99 mg/dL   BUN 30 (H) 6 - 20 mg/dL   Creatinine, Ser 1.28 (H) 0.44 - 1.00 mg/dL   Calcium 9.3 8.9 - 10.3 mg/dL   Total Protein 7.4 6.5 - 8.1 g/dL   Albumin 3.9 3.5 - 5.0 g/dL   AST 22 15 - 41 U/L   ALT 29 14 - 54 U/L   Alkaline Phosphatase 66 38 - 126 U/L   Total Bilirubin 1.7 (H) 0.3 - 1.2 mg/dL   GFR calc non Af Amer 38 (L) >60 mL/min   GFR calc Af Amer 44 (L) >60 mL/min    Comment: (NOTE) The eGFR has been calculated using the CKD EPI equation. This calculation has not been validated in all clinical situations. eGFR's persistently <60 mL/min signify possible Chronic Kidney Disease.    Anion gap 12 5 - 15  CBC with Differential     Status: Abnormal   Collection Time: 04/10/15 12:55 AM  Result Value Ref Range   WBC 8.1 4.0 - 10.5 K/uL   RBC 4.32 3.87 - 5.11 MIL/uL  Hemoglobin 12.3 12.0 - 15.0 g/dL   HCT 39.0 36.0 - 46.0 %   MCV 90.3 78.0 - 100.0 fL   MCH 28.5 26.0 - 34.0 pg   MCHC 31.5 30.0 - 36.0 g/dL   RDW 16.8 (H) 11.5 - 15.5 %   Platelets 182 150 - 400 K/uL   Neutrophils Relative % 68 %   Neutro Abs 5.6 1.7 - 7.7 K/uL   Lymphocytes Relative 16 %   Lymphs Abs 1.3 0.7 - 4.0 K/uL   Monocytes Relative 15 %   Monocytes Absolute 1.2 (H) 0.1 - 1.0 K/uL   Eosinophils Relative 1 %   Eosinophils Absolute 0.0 0.0 - 0.7 K/uL   Basophils Relative 0 %   Basophils Absolute 0.0 0.0 - 0.1 K/uL  Sedimentation rate     Status: Abnormal   Collection Time: 04/10/15 12:55 AM  Result Value Ref Range   Sed Rate 40 (H) 0 - 22 mm/hr  Uric acid     Status: Abnormal   Collection Time: 04/10/15 12:55 AM  Result Value Ref Range   Uric Acid, Serum 9.7 (H) 2.3 - 6.6 mg/dL    No results found.  Review of Systems  HENT: Negative.   Eyes: Negative.   Respiratory: Negative.   Gastrointestinal:  Negative.   Genitourinary: Negative.   Skin: Negative.   Neurological: Negative.   Endo/Heme/Allergies: Negative.    Blood pressure 167/56, pulse 71, temperature 99.2 F (37.3 C), temperature source Oral, resp. rate 16, SpO2 94 %. Physical Exam Right wrist with mild swelling and pain with motion. She is intact sensation of vascular exam there is no ascending erythema or cellulitis. No compartment syndrome no signs of dystrophy or vascular compromise. The patient is alert and oriented in no acute distress. The patient complains of pain in the affected upper extremity.  The patient is noted to have a normal HEENT exam. Lung fields show equal chest expansion and no shortness of breath. Abdomen exam is nontender without distention. Lower extremity examination does not show any fracture dislocation or blood clot symptoms. Pelvis is stable and the neck and back are stable and nontender. Assessment/Plan: Patient has a swollen wrist and a increased uric acid as well as a normal white blood cell count. I would treat her presumably for a inflammatory synovitis presumably gout.  I discussed her all issues. Following this I performed sterile prep followed by administration of Celestone lidocaine mixture in the wrist joint. She tolerated this well and I placed her in a regime of Adaptic Xeroform and a wrist brace. I discussed her all findings and plans. I'm hopeful that this will really do a lot to help her.  Certainly given all the findings and the history I would presumably treat her for gout which we did today. If she acutely worsen she'll notify us immediately.  I've given her my name and number for any problems.  This was an intermediate wrist joint injection. I spent red and 60 minutes with her face-to-face time and all questions were encouraged and answered with a wrist brace dispensed  Doyal Saric III,Valeska Haislip M 04/10/2015, 2:30 AM

## 2015-04-10 NOTE — ED Provider Notes (Signed)
CSN: [id number]     Arrival date & time 04/09/15  2129 History  By signing my name below, I, Marisue Humble, attest that this documentation has been prepared under the direction and in the presence of Devoria Albe, MD at 0045 . Electronically Signed: Marisue Humble, Scribe. 04/10/2015. 1:44 AM.   Chief Complaint  Patient presents with  . Arm Swelling   The history is provided by the patient and a relative. No language interpreter was used.   HPI Comments:  [name] is a 82 y.o. female with PMHx of DM, HTN and edema who presents to the Emergency Department complaining of intermittent, moderate right wrist pain  onset a few days ago, worsening yesterday with increasing pain and now swelling. Pt reports associated pain in MCP joints with finger movement. Relative reports surgery on right wrist 4 weeks ago to clean and drain the joint; she notes the wrist displayed more redness prior to the surgury. Pt is right handed. She is currently taking vancomycin infusion via PICC line; she will finish the antibiotic course in two days for a month long course. . Relative notes tmax 99.2 today and pulse rate measured in 30's a few days ago. Her metoprolol was stopped. She has been living with relatives since her surgery. Pt does not take insulin or monitor blood sugar. Relative reports pt is in stage 3 kidney failure due to DM, but has received contradictory DM dx from multiple doctors. Pt is currently taking Warfarin for A-fib. Pt denies chills, nausea, or vomiting.  PCP Dr Beverely Low  Past Medical History  Diagnosis Date  . DIABETES-TYPE 2   . Other and unspecified hyperlipidemia   . OBESITY   . HYPERTENSION   . Intrinsic asthma, unspecified   . GERD   . KNEE PAIN   . Edema   . DYSPNEA ON EXERTION   . Atrial fibrillation [facility])    Past Surgical History  Procedure Laterality Date  . Abdominal hysterectomy    . I&d extremity Right 03/13/2015    Procedure: IRRIGATION AND DEBRIDEMENT RIGHT WRIST;   Surgeon: Knute Neu, MD;  Location: [facility] ORS;  Service: Plastics;  Laterality: Right;   Family History  Problem Relation Age of Onset  . Coronary artery disease Mother   . Hypertension Mother   . Hypertension Father   . Prostate cancer Father    Social History  Substance Use Topics  . Smoking status: Never Smoker   . Smokeless tobacco: Never Used  . Alcohol Use: No   Was living at home alone, currently staying with her daughter  OB History    No data available     Review of Systems  Constitutional: Negative for fever and chills.  Gastrointestinal: Negative for nausea and vomiting.  Musculoskeletal: Positive for myalgias and joint swelling.  All other systems reviewed and are negative.  Allergies  Review of patient's allergies indicates no known allergies.  Home Medications   Prior to Admission medications   Medication Sig Start Date End Date Taking? Authorizing Provider  acetaminophen (TYLENOL) 500 MG tablet Take 1,000 mg by mouth every 6 (six) hours as needed for mild pain.   Yes Historical Provider, MD  amLODipine (NORVASC) 5 MG tablet Take 10 mg by mouth at bedtime.    Yes Historical Provider, MD  furosemide (LASIX) 40 MG tablet Take 1 tablet (40 mg total) by mouth daily. 03/22/15  Yes Sheliah Hatch, MD  hydrALAZINE (APRESOLINE) 25 MG tablet Pt takes 1 tablet in the am  and 2 tablets in the evening   Yes Historical Provider, MD  simvastatin (ZOCOR) 40 MG tablet TAKE ONE-HALF TABLET BY MOUTH AT BEDTIME 12/28/14  Yes Sheliah Hatch, MD  Vancomycin (VANCOCIN) 750 MG/150ML SOLN Inject 150 mLs (750 mg total) into the vein daily. 03/15/15  Yes Costin Otelia Sergeant, MD  warfarin (COUMADIN) 3 MG tablet Take 2 tablets (6 mg total) by mouth daily. 03/22/15  Yes Sheliah Hatch, MD  albuterol (VENTOLIN HFA) 108 (90 BASE) MCG/ACT inhaler INHALE TWO PUFFS EVERY 4 HOURS AS NEEDED FOR COUGH AND  WHEEZING Patient not taking: Reported on 04/10/2015 06/05/13   Sheliah Hatch, MD   colchicine 0.6 MG tablet Take 1 tablet (0.6 mg total) by mouth 2 (two) times daily. 04/10/15   Devoria Albe, MD  HYDROcodone-acetaminophen (NORCO/VICODIN) 5-325 MG tablet Take 1 tablet by mouth every 6 (six) hours as needed for moderate pain. 04/10/15   Devoria Albe, MD  propranolol (INDERAL) 20 MG tablet TAKE ONE TABLET BY MOUTH TWICE DAILY Patient not taking: Reported on 04/10/2015 02/09/15   Sheliah Hatch, MD   BP 167/56 mmHg  Pulse 71  Temp(Src) 99.2 F (37.3 C) (Oral)  Resp 16  SpO2 94%  Vital signs normal   Physical Exam  Constitutional: She is oriented to person, place, and time. She appears well-developed and well-nourished.  Non-toxic appearance. She does not appear ill. No distress.  HENT:  Head: Normocephalic and atraumatic.  Right Ear: External ear normal.  Left Ear: External ear normal.  Nose: Nose normal. No mucosal edema or rhinorrhea.  Mouth/Throat: Oropharynx is clear and moist and mucous membranes are normal. No dental abscesses or uvula swelling.  Eyes: Conjunctivae and EOM are normal. Pupils are equal, round, and reactive to light.  Neck: Normal range of motion and full passive range of motion without pain. Neck supple.  PICC in right external jugular vein.   Cardiovascular: Normal rate, regular rhythm and normal heart sounds.  Exam reveals no gallop and no friction rub.   No murmur heard. Pulmonary/Chest: Effort normal and breath sounds normal. No respiratory distress. She has no wheezes. She has no rhonchi. She has no rales. She exhibits no tenderness and no crepitus.  Abdominal: Soft. Normal appearance and bowel sounds are normal. She exhibits no distension. There is no tenderness. There is no rebound and no guarding.  Musculoskeletal:  Moves all extremities well except right wrist. Diffuse redness and swelling of dorsal and radial aspect of right wrist with TTP. Cap refill good and distal pulses intact. Resisted range of motion and has extreme discomfort to even  light touch.  Neurological: She is alert and oriented to person, place, and time. She has normal strength. No cranial nerve deficit.  Skin: Skin is warm, dry and intact. No rash noted. No erythema. No pallor.  Psychiatric: She has a normal mood and affect. Her speech is normal and behavior is normal. Her mood appears not anxious.  Nursing note and vitals reviewed.       ED Course  Procedures   Medications  lidocaine (XYLOCAINE) 1 % (with pres) injection (not administered)  colchicine tablet 0.6 mg (0.6 mg Oral Given 04/10/15 0120)  betamethasone acetate-betamethasone sodium phosphate (CELESTONE) injection 12 mg (12 mg Intra-articular Given 04/10/15 0242)  HYDROcodone-acetaminophen (NORCO/VICODIN) 5-325 MG per tablet 1 tablet (1 tablet Oral Given 04/10/15 0332)    DIAGNOSTIC STUDIES:  Oxygen Saturation is 94% on RA, adequate by my interpretation.    COORDINATION OF CARE:  12:54  AM Will order lab work. Discussed lab results with pt and relatives. Discussed treatment plan with pt at bedside and pt agreed to plan. Pt denies any recent trauma to right wrist. Patient was given colchicine orally for presumed acute gout.  1:41 AM Consult with [name]. Pt denies PMHx or FHx of gout or pseudo-gout. Discussed treatment options with pt and relatives. Will order injection.  [name] has injected her wrist with celestone. He wants her to follow up with Dr Izora Ribas this week.   Review of prior testing shows her MRSA screen was negative on January 15. Her joint aspiration done on January 14 had a culture with no growth at 3 days, she had a Gram stain showing many neutrophils but no organisms. Her sedimentation rate was 50.  Labs Review Results for orders placed or performed during the hospital encounter of 04/09/15  Comprehensive metabolic panel  Result Value Ref Range   Sodium 143 135 - 145 mmol/L   Potassium 3.5 3.5 - 5.1 mmol/L   Chloride 102 101 - 111 mmol/L   CO2 29 22 - 32 mmol/L    Glucose, Bld 123 (H) 65 - 99 mg/dL   BUN 30 (H) 6 - 20 mg/dL   Creatinine, Ser 6.96 (H) 0.44 - 1.00 mg/dL   Calcium 9.3 8.9 - 29.5 mg/dL   Total Protein 7.4 6.5 - 8.1 g/dL   Albumin 3.9 3.5 - 5.0 g/dL   AST 22 15 - 41 U/L   ALT 29 14 - 54 U/L   Alkaline Phosphatase 66 38 - 126 U/L   Total Bilirubin 1.7 (H) 0.3 - 1.2 mg/dL   GFR calc non Af Amer 38 (L) >60 mL/min   GFR calc Af Amer 44 (L) >60 mL/min   Anion gap 12 5 - 15  CBC with Differential  Result Value Ref Range   WBC 8.1 4.0 - 10.5 K/uL   RBC 4.32 3.87 - 5.11 MIL/uL   Hemoglobin 12.3 12.0 - 15.0 g/dL   HCT 28.4 13.2 - 44.0 %   MCV 90.3 78.0 - 100.0 fL   MCH 28.5 26.0 - 34.0 pg   MCHC 31.5 30.0 - 36.0 g/dL   RDW 10.2 (H) 72.5 - 36.6 %   Platelets 182 150 - 400 K/uL   Neutrophils Relative % 68 %   Neutro Abs 5.6 1.7 - 7.7 K/uL   Lymphocytes Relative 16 %   Lymphs Abs 1.3 0.7 - 4.0 K/uL   Monocytes Relative 15 %   Monocytes Absolute 1.2 (H) 0.1 - 1.0 K/uL   Eosinophils Relative 1 %   Eosinophils Absolute 0.0 0.0 - 0.7 K/uL   Basophils Relative 0 %   Basophils Absolute 0.0 0.0 - 0.1 K/uL  Sedimentation rate  Result Value Ref Range   Sed Rate 40 (H) 0 - 22 mm/hr  Uric acid  Result Value Ref Range   Uric Acid, Serum 9.7 (H) 2.3 - 6.6 mg/dL   Laboratory interpretation all normal except elevated uric acid, elevated sedimentation rate but improved from when she was hospitalized with a level of 50, renal insufficiency    Imaging Review No results found.   Dg Wrist Complete Right  03/12/2015  CLINICAL DATA:  Gradually increasing RIGHT wrist pain and tenderness since Thursday, awoke with pain, no known injury, history type II diabetes mellitus, obesity, hypertension, asthma, atrial fibrillation  IMPRESSION: Degenerative changes at first [facility] joint and minimally radiocarpal joint. Additional cystic degenerative changes at the lunatotriquetral joint. No other focal  osseous abnormalities. Electronically Signed   By: Ulyses Southward  M.D.   On: 03/12/2015 16:45  I have personally reviewed and evaluated these lab results as part of my medical decision-making.   MDM  patient presents after having a washout of her right wrist felt to be from a septic joint and is currently getting vancomycin and has almost finished a four-week course. She reports she started getting increasing pain and then tonight redness and swelling of her wrist. Her uric acid level is elevated and her symptoms are consistent with acute gout. She was seen with the hand surgeon on call and he injected her joint with steroids. She was started on colchicine and is to follow-up with her orthopedist this week.    Final diagnoses:  Acute gouty arthritis  Wrist pain, right    New Prescriptions   COLCHICINE 0.6 MG TABLET    Take 1 tablet (0.6 mg total) by mouth 2 (two) times daily.   HYDROCODONE-ACETAMINOPHEN (NORCO/VICODIN) 5-325 MG TABLET    Take 1 tablet by mouth every 6 (six) hours as needed for moderate pain.    Plan discharge  Devoria Albe, MD, FACEP   I personally performed the services described in this documentation, which was scribed in my presence. The recorded information has been reviewed and considered.  Devoria Albe, MD, Concha Pyo, MD 04/10/15 [phone]

## 2015-04-11 ENCOUNTER — Telehealth: Payer: Self-pay

## 2015-04-11 DIAGNOSIS — J45909 Unspecified asthma, uncomplicated: Secondary | ICD-10-CM | POA: Diagnosis not present

## 2015-04-11 DIAGNOSIS — N183 Chronic kidney disease, stage 3 (moderate): Secondary | ICD-10-CM | POA: Diagnosis not present

## 2015-04-11 DIAGNOSIS — L03113 Cellulitis of right upper limb: Secondary | ICD-10-CM | POA: Diagnosis not present

## 2015-04-11 DIAGNOSIS — M00831 Arthritis due to other bacteria, right wrist: Secondary | ICD-10-CM | POA: Diagnosis not present

## 2015-04-11 DIAGNOSIS — I5031 Acute diastolic (congestive) heart failure: Secondary | ICD-10-CM | POA: Diagnosis not present

## 2015-04-11 DIAGNOSIS — I4891 Unspecified atrial fibrillation: Secondary | ICD-10-CM | POA: Diagnosis not present

## 2015-04-11 DIAGNOSIS — I129 Hypertensive chronic kidney disease with stage 1 through stage 4 chronic kidney disease, or unspecified chronic kidney disease: Secondary | ICD-10-CM | POA: Diagnosis not present

## 2015-04-11 DIAGNOSIS — F0390 Unspecified dementia without behavioral disturbance: Secondary | ICD-10-CM | POA: Diagnosis not present

## 2015-04-11 DIAGNOSIS — E1122 Type 2 diabetes mellitus with diabetic chronic kidney disease: Secondary | ICD-10-CM | POA: Diagnosis not present

## 2015-04-11 NOTE — Telephone Encounter (Signed)
Pt is following w/ ID- they will be responsible for dc'ing abx and PICC line

## 2015-04-11 NOTE — Telephone Encounter (Signed)
Patient was diagnosed with infection in wrist and more recently gout. Daughter would like to know if they should d/c ABO and would like to know who will remove pic line. States Home care is confused about what to do.

## 2015-04-11 NOTE — Telephone Encounter (Signed)
Called and spoke with pt daughter, She advised that the family was told Abx should be d/c'd today.  Pt has a follow up with Dr. Drue Second with [facility] on 04/25/15. I gave pt daughter the phone number for the office [phone] to see if they can get further advice. Daughter stated an understanding and was hanging up to call ID.

## 2015-04-12 ENCOUNTER — Encounter: Payer: Self-pay | Admitting: Family Medicine

## 2015-04-12 ENCOUNTER — Ambulatory Visit (INDEPENDENT_AMBULATORY_CARE_PROVIDER_SITE_OTHER): Payer: Commercial Managed Care - HMO | Admitting: Family Medicine

## 2015-04-12 VITALS — BP 128/56 | HR 65 | Temp 97.9°F | Ht 60.0 in | Wt 179.6 lb

## 2015-04-12 DIAGNOSIS — M10031 Idiopathic gout, right wrist: Secondary | ICD-10-CM | POA: Diagnosis not present

## 2015-04-12 DIAGNOSIS — M109 Gout, unspecified: Secondary | ICD-10-CM | POA: Insufficient documentation

## 2015-04-12 MED ORDER — COLCHICINE 0.6 MG PO TABS: 0.6000 mg | ORAL_TABLET | Freq: Every day | ORAL | Status: DC

## 2015-04-12 NOTE — Progress Notes (Signed)
Pre visit review using our clinic review tool, if applicable. No additional management support is needed unless otherwise documented below in the visit note. 

## 2015-04-12 NOTE — Assessment & Plan Note (Signed)
New.  Reviewed ER note and Ortho note from pt's visit 2 days ago.  Pt's pain has resolved and she is no longer swollen or red over R wrist.  Discussed treatments for elevated uric acid moving forward- pt fears starting allopurinol will cause another flare and due to her renal insufficiency, she is at higher risk for an allopurinol rash.  Will continue colchicine but decrease to 1 tab daily until she see nephrology and they determine what the best course of action is.  Pt and daughter expressed understanding and are in agreement w/ plan.

## 2015-04-12 NOTE — Progress Notes (Signed)
   Subjective:    Patient ID: [name], female    DOB: [birth year], 82 y.o.   MRN: [id number]  HPI ER f/u- pt went to ER 2 days ago w/ acute gout flare.  Dr Butler Denmark injected steroids into R wrist.  Started her on colchicine 0.6mg  BID.  pt is in R wrist brace but pt would like to remove this for better utility. Pt has not taken off brace or gauze since ER.    Review of Systems For ROS see HPI     Objective:   Physical Exam  Constitutional: She appears well-developed and well-nourished. No distress.  HENT:  Head: Normocephalic and atraumatic.  Musculoskeletal: She exhibits no edema or tenderness (no TTP over R wrist, full ROM).  Neurological:  Pt seems mildly confused today  Skin: Skin is warm and dry. No erythema.  Psychiatric: She has a normal mood and affect. Her behavior is normal.  Vitals reviewed.         Assessment & Plan:

## 2015-04-12 NOTE — Patient Instructions (Signed)
Follow up as needed Continue the Colchicine once daily (this is a decrease from the twice daily) Drink plenty of water You are due to follow up w/ the kidney doctor in April- I would take the colchicine until then You only need to wear the brace for comfort Call with any questions or concerns Hang in there! Happy Valentine's Day!

## 2015-04-13 ENCOUNTER — Telehealth: Payer: Self-pay | Admitting: Family Medicine

## 2015-04-13 DIAGNOSIS — J45909 Unspecified asthma, uncomplicated: Secondary | ICD-10-CM | POA: Diagnosis not present

## 2015-04-13 DIAGNOSIS — L03113 Cellulitis of right upper limb: Secondary | ICD-10-CM | POA: Diagnosis not present

## 2015-04-13 DIAGNOSIS — F0390 Unspecified dementia without behavioral disturbance: Secondary | ICD-10-CM | POA: Diagnosis not present

## 2015-04-13 DIAGNOSIS — N183 Chronic kidney disease, stage 3 (moderate): Secondary | ICD-10-CM | POA: Diagnosis not present

## 2015-04-13 DIAGNOSIS — I5031 Acute diastolic (congestive) heart failure: Secondary | ICD-10-CM | POA: Diagnosis not present

## 2015-04-13 DIAGNOSIS — I4891 Unspecified atrial fibrillation: Secondary | ICD-10-CM | POA: Diagnosis not present

## 2015-04-13 DIAGNOSIS — M00831 Arthritis due to other bacteria, right wrist: Secondary | ICD-10-CM | POA: Diagnosis not present

## 2015-04-13 DIAGNOSIS — I129 Hypertensive chronic kidney disease with stage 1 through stage 4 chronic kidney disease, or unspecified chronic kidney disease: Secondary | ICD-10-CM | POA: Diagnosis not present

## 2015-04-13 DIAGNOSIS — E1122 Type 2 diabetes mellitus with diabetic chronic kidney disease: Secondary | ICD-10-CM | POA: Diagnosis not present

## 2015-04-13 NOTE — Telephone Encounter (Signed)
Called and left a detailed message to discharge services, Also advised that ID was the one managing this not [place].

## 2015-04-13 NOTE — Telephone Encounter (Signed)
Caller name:Joanie Relationship to patient:[facility] Can be reached:[phone] Pharmacy:  Reason for call:Needs order to discharge services, patient is finished with antibiotics

## 2015-04-13 NOTE — Telephone Encounter (Signed)
Ok for order but this was being managed by ID

## 2015-04-18 ENCOUNTER — Telehealth: Payer: Self-pay | Admitting: *Deleted

## 2015-04-18 NOTE — Telephone Encounter (Signed)
Received Physician Orders from [facility]; forwarded to provider/SLS 02/20

## 2015-04-21 ENCOUNTER — Encounter: Payer: Self-pay | Admitting: Family Medicine

## 2015-04-25 ENCOUNTER — Encounter: Payer: Self-pay | Admitting: Internal Medicine

## 2015-04-25 ENCOUNTER — Ambulatory Visit (INDEPENDENT_AMBULATORY_CARE_PROVIDER_SITE_OTHER): Payer: Commercial Managed Care - HMO | Admitting: Internal Medicine

## 2015-04-25 VITALS — BP 150/62 | HR 59 | Ht 60.0 in | Wt 170.0 lb

## 2015-04-25 DIAGNOSIS — M1000 Idiopathic gout, unspecified site: Secondary | ICD-10-CM | POA: Diagnosis not present

## 2015-04-25 LAB — C-REACTIVE PROTEIN: CRP: <0.5 mg/dL (ref <0.60)

## 2015-04-25 LAB — RHEUMATOID FACTOR: Rhuematoid fact SerPl-aCnc: <10 IU/mL (ref <=14)

## 2015-04-25 LAB — URIC ACID: Uric Acid, Serum: 11.8 mg/dL — ABNORMAL HIGH (ref 2.4–7.0)

## 2015-04-25 MED ORDER — PREDNISONE 10 MG (21) PO TBPK: 10.0000 mg | ORAL_TABLET | Freq: Every day | ORAL | Status: DC

## 2015-04-25 NOTE — Progress Notes (Signed)
RFV: follow up for septic arthritis Subjective:    Patient ID: [name], female    DOB: February 14, 1930, 82 y.o.   MRN: [id number]  HPI [name] is a 82 y.o. Right handed female with hx of DM2, CKD3, afib on coumadin who was admitted on 1/14 for progressive right wrist pain, tenderness and swelling, erythema that progressed over 4-5days. No trauma or animal scratchs or any nearby skin lesions to the wrist that she recalls. She was evaluated by Dr. Tanna Furry where an aspirate of joint had cloudy fluid but cell count unable to be done. Fluid showed abundant WBC no organism. Cx is NGTD at 36hr. Patient had elevated WBC of 11K. She was started on vancomycin and piptazo. She underwent debridement on 1/15. ID consulted for abtx. She was discharged on 4 wk of vancomycin ending on 2/13. She is finishing up ceclor for septic arthritis. She had picc line pulled on 2/12. She Had subsequent increasing wrist pain and went to the ED on 2/12, where she was evaluated by [name] who felt it was more consistent with gouty flare. Patient still reports tenderness to right wrist and right lateral aspect of hand. She is using her left hand more frequently. No erythema or swelling but does have point tenderness to her right wrist.  Current Outpatient Prescriptions on File Prior to Visit  Medication Sig Dispense Refill  . acetaminophen (TYLENOL) 500 MG tablet Take 1,000 mg by mouth every 6 (six) hours as needed for mild pain.    Marland Kitchen albuterol (VENTOLIN HFA) 108 (90 BASE) MCG/ACT inhaler INHALE TWO PUFFS EVERY 4 HOURS AS NEEDED FOR COUGH AND  WHEEZING 18 g 2  . amLODipine (NORVASC) 5 MG tablet Take 10 mg by mouth at bedtime.     . colchicine 0.6 MG tablet Take 1 tablet (0.6 mg total) by mouth daily. 30 tablet 1  . furosemide (LASIX) 40 MG tablet Take 1 tablet (40 mg total) by mouth daily. 30 tablet 3  . hydrALAZINE (APRESOLINE) 25 MG tablet Pt takes 1 tablet in the am and 2 tablets in the evening    .  HYDROcodone-acetaminophen (NORCO/VICODIN) 5-325 MG tablet Take 1 tablet by mouth every 6 (six) hours as needed for moderate pain. 4 tablet 0  . propranolol (INDERAL) 20 MG tablet TAKE ONE TABLET BY MOUTH TWICE DAILY 60 tablet 6  . simvastatin (ZOCOR) 40 MG tablet TAKE ONE-HALF TABLET BY MOUTH AT BEDTIME 30 tablet 6  . Vancomycin (VANCOCIN) 750 MG/150ML SOLN Inject 150 mLs (750 mg total) into the vein daily. 4200 mL 0  . warfarin (COUMADIN) 3 MG tablet Take 2 tablets (6 mg total) by mouth daily. 60 tablet 6   No current facility-administered medications on file prior to visit.   Active Ambulatory Problems    Diagnosis Date Noted  . History of diet-controlled diabetes 11/09/2009  . OBESITY 12/07/2008  . Essential hypertension 03/24/2008  . INTRINSIC ASTHMA, UNSPECIFIED 09/27/2009  . GERD 03/24/2008  . KNEE PAIN 07/20/2009  . EDEMA 07/20/2009  . CAROTID BRUIT, LEFT 11/09/2009  . DYSPNEA ON EXERTION 08/19/2009  . Atrial fibrillation (HCC) 04/26/2010  . Bradycardia 09/09/2010  . Acute renal failure (HCC) 12/29/2010  . Anemia 12/29/2010  . Abnormal LFTs 12/29/2010  . Current use of long term anticoagulation 12/29/2010  . Hyperlipidemia 02/21/2011  . Bronchitis 03/08/2011  . Osteopenia 08/16/2011  . General medical examination 08/16/2011  . Asthma with acute exacerbation 12/10/2012  . Encounter for work capability assessment 04/23/2013  .  Tremor of both hands 01/14/2014  . BRBPR (bright red blood per rectum) 01/14/2014  . Cellulitis 03/12/2015  . Septic joint of right wrist (HCC) 03/12/2015  . CKD stage 3 due to type 2 diabetes mellitus (HCC) 03/12/2015  . Chronic diastolic CHF (congestive heart failure), NYHA class 1 (HCC) 03/12/2015  . Cellulitis of right upper extremity   . Receiving intravenous antibiotic treatment at home   . Gout of right wrist 04/12/2015   Resolved Ambulatory Problems    Diagnosis Date Noted  . Other and unspecified hyperlipidemia 01/12/2009  . IRREGULAR  HEART RATE 03/24/2008  . REACTIVE AIRWAY DISEASE 03/24/2008  . HYPERGLYCEMIA, BORDERLINE 02/16/2009   Past Medical History  Diagnosis Date  . DIABETES-TYPE 2   . HYPERTENSION       Review of Systems + wrist pain, right. 10 point ros is otherwise negative    Objective:   Physical Exam  BP 150/62 mmHg  Pulse 59  Ht 5' (1.524 m)  Wt 170 lb (77.111 kg)  BMI 33.20 kg/m2 Ext = pain to joint and right lateral aspect Neuro = decrease range of motion with wrist flexion and extension but no difficulty with supination or pronation      Assessment & Plan:  She has finished 6 wk of abtx to treat presumed septic arthritis but concern that she may have reactive arthritis/gouty arthritis - will check sed rate and crp, uric acid, RF, and ANA - ask her to finish up ceclor  bid to finish treatment since she has finisehd 6 wk of treatment - will do a trial of steroids x 10 d. If not improved, will refer to neurology

## 2015-04-26 LAB — SEDIMENTATION RATE: Sed Rate: 12 mm/hr (ref 0–30)

## 2015-04-26 LAB — ANA: Anti Nuclear Antibody(ANA): NEGATIVE

## 2015-04-27 ENCOUNTER — Inpatient Hospital Stay: Payer: Commercial Managed Care - HMO | Admitting: Internal Medicine

## 2015-05-09 ENCOUNTER — Telehealth: Payer: Self-pay | Admitting: Family Medicine

## 2015-05-09 NOTE — Telephone Encounter (Signed)
Caller name: Carmin RichmondOrena Relationship to patient:Daughter Can be reached: [phone]   Reason for call: Daughter wants to know if patient needs to continue taking CECLOR) 250 MG capsule   ALSO: When does patient need to get PT/INR?

## 2015-05-10 NOTE — Telephone Encounter (Signed)
Called and spoke with pt daughter, advised that at hospital follow up Dr. Beverely Lowabori advised that pt was to take colchicine only once daily. Advised that since we did not begin pt on ceclor that we cannot stop it, they would have to follow up with ID. Pt daughter also advised that pt was overdue for INR. Pt was scheduled for Thursday of this week.

## 2015-05-12 ENCOUNTER — Ambulatory Visit (INDEPENDENT_AMBULATORY_CARE_PROVIDER_SITE_OTHER): Payer: Commercial Managed Care - HMO | Admitting: Behavioral Health

## 2015-05-12 DIAGNOSIS — I4891 Unspecified atrial fibrillation: Secondary | ICD-10-CM

## 2015-05-12 LAB — POCT INR: INR: >8

## 2015-05-12 NOTE — Patient Instructions (Addendum)
Per Dr. Beverely Lowabori: Patient should have an INR lab drawn today.  Will call with results.

## 2015-05-12 NOTE — Progress Notes (Signed)
Pre visit review using our clinic review tool, if applicable. No additional management support is needed unless otherwise documented below in the visit note.  Patient and daughter in office for INR check. Today's reading > 8.0. Patient reported taking Coumadin 6 mg each day and has missed two doses prior to nurse visit. Also, she addressed not having eating much green vegetables, only green beans and peas. Education provided to the patient about foods that are high in Vitamin K and the importance of being consistent with green intake.  Per Dr. Beverely Lowabori: Patient should have a INR lab drawn today. Informed the patient of the provider's instructions. She understood and went to the lab here in office.  Results not in yet. Patient called and phone given to the daughter. Daughter instructed to hold Coumadin until further notice and if any signs of bleeding (i.e. extreme headache, abnormal pain, blood in urine/stool), go to the ER.

## 2015-05-13 ENCOUNTER — Telehealth: Payer: Self-pay | Admitting: *Deleted

## 2015-05-13 LAB — PROTIME-INR
INR: 7.7 ratio — AB (ref 0.8–1.0)
Prothrombin Time: 85.5 s (ref 9.6–13.1)

## 2015-05-13 NOTE — Telephone Encounter (Signed)
Please call pt and make sure she is not having any abnormal bleeding.  She needs to continue to hold coumadin until PT check on Wednesday (please schedule)

## 2015-05-13 NOTE — Telephone Encounter (Signed)
elam lab reporting critical . Patients PT @ 85.5                INR @ 7.7

## 2015-05-13 NOTE — Telephone Encounter (Signed)
Called and spoke with pt daughter, advised of PCP recommendations and she expressed an understanding. Pt was scheduled for 2:15 on Wednesday 3/22.

## 2015-05-18 ENCOUNTER — Ambulatory Visit (INDEPENDENT_AMBULATORY_CARE_PROVIDER_SITE_OTHER): Payer: Commercial Managed Care - HMO | Admitting: Behavioral Health

## 2015-05-18 DIAGNOSIS — I4891 Unspecified atrial fibrillation: Secondary | ICD-10-CM

## 2015-05-18 LAB — POCT INR: INR: 1.5

## 2015-05-18 NOTE — Progress Notes (Addendum)
Pre visit review using our clinic review tool, if applicable. No additional management support is needed unless otherwise documented below in the visit note.  Patient presents in office today, accompanied by her daughter for an INR recheck. The reading was 1.5. Patient reported that she's held her Coumadin since last week as instructed, but did consume a little more greens than usual. She stated, "I've been eating broccoli and brussels sprouts since my last visit." Reiterated to the patient that consuming more greens in your diet, especially those foods that are high in Vitamin K can increase the risk for blood clots. Therefore it's important to be consistent with greens intake each week.   Per [place], PA-C: Take Coumadin 3 mg each day. Return in 1 week for INR check. Patient made aware of the provider's recommendations. She and the daughter understood and did not have any concerns prior to leaving the nurse visit.  Next appointment scheduled for 05/26/15 at 2:00 PM.  This was my advised instructions. Pt had prior INR very high around 7 or more. Was taking 6 mg a day. Then stopped med for one week. Hopefully with this dose she will be within 2-3 range. If not she may need to see coumadin clinic.

## 2015-05-18 NOTE — Patient Instructions (Addendum)
Per Saguier, PA-C: Take Coumadin 3 mg each day. Return in 1 week for INR check.

## 2015-05-24 ENCOUNTER — Ambulatory Visit (INDEPENDENT_AMBULATORY_CARE_PROVIDER_SITE_OTHER): Payer: Commercial Managed Care - HMO | Admitting: Internal Medicine

## 2015-05-24 ENCOUNTER — Ambulatory Visit: Payer: Commercial Managed Care - HMO | Admitting: Family Medicine

## 2015-05-24 VITALS — BP 152/79 | HR 73 | Temp 97.8°F | Wt 183.0 lb

## 2015-05-24 DIAGNOSIS — M7989 Other specified soft tissue disorders: Secondary | ICD-10-CM | POA: Diagnosis not present

## 2015-05-24 DIAGNOSIS — M1000 Idiopathic gout, unspecified site: Secondary | ICD-10-CM | POA: Diagnosis not present

## 2015-05-24 DIAGNOSIS — T50905A Adverse effect of unspecified drugs, medicaments and biological substances, initial encounter: Secondary | ICD-10-CM | POA: Diagnosis not present

## 2015-05-24 DIAGNOSIS — Z789 Other specified health status: Secondary | ICD-10-CM

## 2015-05-24 MED ORDER — COLCHICINE 0.6 MG PO TABS: 0.6000 mg | ORAL_TABLET | Freq: Every day | ORAL | Status: DC

## 2015-05-24 MED ORDER — ALLOPURINOL 100 MG PO TABS: 50.0000 mg | ORAL_TABLET | Freq: Every day | ORAL | Status: DC

## 2015-05-24 MED ORDER — PREDNISONE 10 MG (21) PO TBPK: 10.0000 mg | ORAL_TABLET | Freq: Every day | ORAL | Status: DC

## 2015-05-24 NOTE — Progress Notes (Signed)
RFV: follow up for gouty flare Subjective:    Patient ID: [name], female    DOB: February 08, 1930, 82 y.o.   MRN: [id number]  HPI  [name] is a 82 y.o. Right handed female with hx of DM2, CKD3, afib on coumadin who was admitted on 1/14 for progressive right wrist pain, tenderness and swelling, erythema that progressed over 4-5days. No trauma or animal scratchs or any nearby skin lesions to the wrist that she recalls. She was evaluated by Dr. Tanna Furryolley where an aspirate of joint had cloudy fluid but cell count unable to be done. Fluid showed abundant WBC no organism. Cx is NGTD. Patient had elevated WBC of 11K. She was started on vancomycin and piptazo. She underwent debridement on 1/15.  She was discharged on 4 wk of vancomycin ending on 2/13. She is finishing up ceclor for septic arthritis. She had second flare of increasing wrist pain and went to the ED on 2/12, where she was evaluated by dr. Amanda Peagramig who felt it was more consistent with gouty flare. We saw her in follow up in late February where patient still reports tenderness to right wrist and right lateral aspect of hand. She is using her left hand more frequently. No erythema or swelling but does have point tenderness to her right wrist. Labs at that visit showed elevated uric acid. Treated flare with steroids which helped her symptoms.  Now in the past 3 days, she has noticed increasing right wrist pain and swelling as well as left knee pain and left calf swelling. She booked appt for evaluation and management. No fever or chills, no recent long car trips  No Known Allergies Current Outpatient Prescriptions on File Prior to Visit  Medication Sig Dispense Refill  . albuterol (VENTOLIN HFA) 108 (90 BASE) MCG/ACT inhaler INHALE TWO PUFFS EVERY 4 HOURS AS NEEDED FOR COUGH AND  WHEEZING 18 g 2  . amLODipine (NORVASC) 5 MG tablet Take 10 mg by mouth at bedtime.     . hydrALAZINE (APRESOLINE) 25 MG tablet Pt takes 1 tablet in the am and 2 tablets  in the evening    . propranolol (INDERAL) 20 MG tablet TAKE ONE TABLET BY MOUTH TWICE DAILY 60 tablet 6  . simvastatin (ZOCOR) 40 MG tablet TAKE ONE-HALF TABLET BY MOUTH AT BEDTIME 30 tablet 6  . warfarin (COUMADIN) 3 MG tablet Take 2 tablets (6 mg total) by mouth daily. 60 tablet 6  . acetaminophen (TYLENOL) 500 MG tablet Take 1,000 mg by mouth every 6 (six) hours as needed for mild pain. Reported on 05/24/2015    . cefaclor (CECLOR) 250 MG capsule Take 250 mg by mouth daily. Reported on 05/24/2015    . colchicine 0.6 MG tablet Take 1 tablet (0.6 mg total) by mouth daily. (Patient not taking: Reported on 05/24/2015) 30 tablet 1  . furosemide (LASIX) 40 MG tablet Take 1 tablet (40 mg total) by mouth daily. (Patient not taking: Reported on 05/24/2015) 30 tablet 3  . HYDROcodone-acetaminophen (NORCO/VICODIN) 5-325 MG tablet Take 1 tablet by mouth every 6 (six) hours as needed for moderate pain. (Patient not taking: Reported on 05/24/2015) 4 tablet 0  . predniSONE (STERAPRED UNI-PAK 21 TAB) 10 MG (21) TBPK tablet Take 1 tablet (10 mg total) by mouth daily. Take 2 tablets by mouth x 5 days, then 1 tab by mouth 5 days, then 1/2 tab x 6 days (Patient not taking: Reported on 05/24/2015) 20 tablet 0   No current facility-administered medications on  file prior to visit.   Active Ambulatory Problems    Diagnosis Date Noted  . History of diet-controlled diabetes 11/09/2009  . OBESITY 12/07/2008  . Essential hypertension 03/24/2008  . INTRINSIC ASTHMA, UNSPECIFIED 09/27/2009  . GERD 03/24/2008  . KNEE PAIN 07/20/2009  . EDEMA 07/20/2009  . CAROTID BRUIT, LEFT 11/09/2009  . DYSPNEA ON EXERTION 08/19/2009  . Atrial fibrillation (HCC) 04/26/2010  . Bradycardia 09/09/2010  . Acute renal failure (HCC) 12/29/2010  . Anemia 12/29/2010  . Abnormal LFTs 12/29/2010  . Current use of long term anticoagulation 12/29/2010  . Hyperlipidemia 02/21/2011  . Bronchitis 03/08/2011  . Osteopenia 08/16/2011  . General  medical examination 08/16/2011  . Asthma with acute exacerbation 12/10/2012  . Encounter for work capability assessment 04/23/2013  . Tremor of both hands 01/14/2014  . BRBPR (bright red blood per rectum) 01/14/2014  . Cellulitis 03/12/2015  . Septic joint of right wrist (HCC) 03/12/2015  . CKD stage 3 due to type 2 diabetes mellitus (HCC) 03/12/2015  . Chronic diastolic CHF (congestive heart failure), NYHA class 1 (HCC) 03/12/2015  . Cellulitis of right upper extremity   . Receiving intravenous antibiotic treatment at home   . Gout of right wrist 04/12/2015   Resolved Ambulatory Problems    Diagnosis Date Noted  . Other and unspecified hyperlipidemia 01/12/2009  . IRREGULAR HEART RATE 03/24/2008  . REACTIVE AIRWAY DISEASE 03/24/2008  . HYPERGLYCEMIA, BORDERLINE 02/16/2009   Past Medical History  Diagnosis Date  . DIABETES-TYPE 2   . HYPERTENSION    Social History  Substance Use Topics  . Smoking status: Never Smoker   . Smokeless tobacco: Never Used  . Alcohol Use: No    Review of Systems + joint pain per hpi. 10 point ros is otherwise negative    Objective:   Physical Exam BP 152/79 mmHg  Pulse 73  Temp(Src) 97.8 F (36.6 C) (Oral)  Wt 183 lb (83.008 kg) Physical Exam  Constitutional:  oriented to person, place, and time. appears well-developed and well-nourished. No distress.  HENT: [place]/AT, PERRLA, no scleral icterus Ext: tender to palpation to right wrist, and inferior aspect of patella/knee of left leg. +1 pitting edema to LLE > RLE. No erythema to leg or wrist Neurological: alert and oriented to person, place, and time.  Skin: Skin is warm and dry. No rash noted. No erythema.  Psychiatric: a normal mood and affect.  behavior is normal.    Lab Results  Component Value Date   ESRSEDRATE 12 04/25/2015   Lab Results  Component Value Date   CRP <0.5 04/25/2015   Uric acid 11     Assessment & Plan:  Acute gouty flare involving rigt wrist and left knee =  colchicine 0.6mg  x 1, 0.3mg  x 1. Plus steroid taper. Then can start Allopurinol  daily in roughly  2 wks for prophylaxis  Unilateral leg calf swelling = unclear if gouty flare of knee joint could cause swelling to calf. We wil rule out for dvt byt getting left leg U/S  Drug interaction = will ask her to stop lipid lower agent x 7 day while being treated for gout  I will contact Dr. Beverely Low to see if can have patient follow up with her for further management of polyarticular gout.  Spent 40 min with patient with greather than 50% spent in counseling and coordination for acute gout Cc dr Beverely Low

## 2015-05-26 ENCOUNTER — Ambulatory Visit: Payer: Commercial Managed Care - HMO

## 2015-05-26 ENCOUNTER — Other Ambulatory Visit (INDEPENDENT_AMBULATORY_CARE_PROVIDER_SITE_OTHER): Payer: Commercial Managed Care - HMO

## 2015-05-26 ENCOUNTER — Other Ambulatory Visit: Payer: Self-pay | Admitting: Family Medicine

## 2015-05-26 DIAGNOSIS — Z7901 Long term (current) use of anticoagulants: Secondary | ICD-10-CM

## 2015-05-26 LAB — PROTIME-INR
INR: 1.4 ratio — AB (ref 0.8–1.0)
Prothrombin Time: 14.3 s — ABNORMAL HIGH (ref 9.6–13.1)

## 2015-06-01 ENCOUNTER — Ambulatory Visit (INDEPENDENT_AMBULATORY_CARE_PROVIDER_SITE_OTHER): Payer: Commercial Managed Care - HMO | Admitting: General Practice

## 2015-06-01 DIAGNOSIS — Z7189 Other specified counseling: Secondary | ICD-10-CM | POA: Insufficient documentation

## 2015-06-01 DIAGNOSIS — I4891 Unspecified atrial fibrillation: Secondary | ICD-10-CM

## 2015-06-01 DIAGNOSIS — Z5181 Encounter for therapeutic drug level monitoring: Secondary | ICD-10-CM | POA: Diagnosis not present

## 2015-06-01 LAB — POCT INR: INR: 2.2

## 2015-06-01 NOTE — Progress Notes (Signed)
Pre visit review using our clinic review tool, if applicable. No additional management support is needed unless otherwise documented below in the visit note. 

## 2015-06-01 NOTE — Progress Notes (Signed)
I have reviewed and agree with the plan. 

## 2015-06-17 ENCOUNTER — Ambulatory Visit (INDEPENDENT_AMBULATORY_CARE_PROVIDER_SITE_OTHER): Payer: Commercial Managed Care - HMO | Admitting: General Practice

## 2015-06-17 DIAGNOSIS — Z5181 Encounter for therapeutic drug level monitoring: Secondary | ICD-10-CM | POA: Diagnosis not present

## 2015-06-17 DIAGNOSIS — I4891 Unspecified atrial fibrillation: Secondary | ICD-10-CM

## 2015-06-17 LAB — POCT INR: INR: 1.5

## 2015-06-17 NOTE — Progress Notes (Signed)
Pre visit review using our clinic review tool, if applicable. No additional management support is needed unless otherwise documented below in the visit note. 

## 2015-06-17 NOTE — Progress Notes (Signed)
I have reviewed and agree with the plan. 

## 2015-06-28 ENCOUNTER — Encounter: Payer: Self-pay | Admitting: Family Medicine

## 2015-06-28 ENCOUNTER — Ambulatory Visit (INDEPENDENT_AMBULATORY_CARE_PROVIDER_SITE_OTHER): Payer: Commercial Managed Care - HMO | Admitting: Family Medicine

## 2015-06-28 VITALS — BP 123/83 | HR 53 | Temp 99.1°F | Resp 16 | Wt 180.1 lb

## 2015-06-28 DIAGNOSIS — M10031 Idiopathic gout, right wrist: Secondary | ICD-10-CM

## 2015-06-28 DIAGNOSIS — M1000 Idiopathic gout, unspecified site: Secondary | ICD-10-CM

## 2015-06-28 DIAGNOSIS — M109 Gout, unspecified: Secondary | ICD-10-CM

## 2015-06-28 MED ORDER — COLCHICINE 0.6 MG PO TABS: ORAL_TABLET | ORAL | Status: DC

## 2015-06-28 MED ORDER — ALLOPURINOL 100 MG PO TABS: 100.0000 mg | ORAL_TABLET | Freq: Every day | ORAL | Status: DC

## 2015-06-28 MED ORDER — PREDNISONE 10 MG PO TABS: ORAL_TABLET | ORAL | Status: DC

## 2015-06-28 NOTE — Progress Notes (Signed)
Pre visit review using our clinic review tool, if applicable. No additional management support is needed unless otherwise documented below in the visit note. 

## 2015-06-28 NOTE — Patient Instructions (Signed)
Follow up in 1 week to recheck wrist Start the cholchicine as directed today.  You will take 3 pills total- the remaining 6 are for future flares Start the Prednisone as directed- take w/ food Increase the allopurinol to 1 tab daily- let the coumadin clinic know we made this change Call with any questions or concerns Hang in there!!!

## 2015-06-28 NOTE — Progress Notes (Signed)
   Subjective:    Patient ID: [name], female    DOB: [birth year], 82 y.o.   MRN: [id number]  HPI Gout- R wrist pain.  Pt has hx of similar.  Pt is currently taking 1/2 tab allopurinol daily.  sxs started Saturday w/ pain, swelling, and redness to R wrist.  Area is warm to the touch.  Area is very painful.  Pt has been wearing wrist brace for comfort.  Pt has previously taking colchicine but not recently.  No fevers.  No N/V.    Review of Systems For ROS see HPI     Objective:   Physical Exam  Constitutional: She is oriented to person, place, and time. She appears well-developed and well-nourished. No distress.  HENT:  Head: Normocephalic and atraumatic.  Cardiovascular: Intact distal pulses.   Musculoskeletal: She exhibits edema (swelling of R dorsal wrist w/ limited flexion/extension) and tenderness (very TTP over R wrist).  Neurological: She is alert and oriented to person, place, and time.  Skin: Skin is warm and dry. There is erythema.  Vitals reviewed.         Assessment & Plan:

## 2015-06-30 NOTE — Telephone Encounter (Signed)
03/22/15 lab work results received @ 0821, BUN/Creatinine elevated in pt with CKD that is monitored by Dr. Beverely Lowabori.  Vancomycin IV dose changed to 1 gram to start Friday (03/25/15) [facility].  [facility] to redraw labs Sunday or Monday (03/27/15 or 03/28/15).

## 2015-07-01 ENCOUNTER — Ambulatory Visit (INDEPENDENT_AMBULATORY_CARE_PROVIDER_SITE_OTHER): Payer: Commercial Managed Care - HMO | Admitting: General Practice

## 2015-07-01 DIAGNOSIS — Z5181 Encounter for therapeutic drug level monitoring: Secondary | ICD-10-CM | POA: Diagnosis not present

## 2015-07-01 DIAGNOSIS — I4891 Unspecified atrial fibrillation: Secondary | ICD-10-CM | POA: Diagnosis not present

## 2015-07-01 LAB — POCT INR: INR: 2.2

## 2015-07-01 NOTE — Progress Notes (Signed)
Pre visit review using our clinic review tool, if applicable. No additional management support is needed unless otherwise documented below in the visit note. 

## 2015-07-03 NOTE — Assessment & Plan Note (Signed)
Deteriorated.  Pt is in an acute flare.  Restart Colchicine and prednisone.  Increase Allopurinol to 1 tab daily.  Reviewed supportive care and red flags that should prompt return.  Pt expressed understanding and is in agreement w/ plan.

## 2015-07-04 ENCOUNTER — Ambulatory Visit (INDEPENDENT_AMBULATORY_CARE_PROVIDER_SITE_OTHER): Payer: Commercial Managed Care - HMO | Admitting: Family Medicine

## 2015-07-04 ENCOUNTER — Encounter: Payer: Self-pay | Admitting: Family Medicine

## 2015-07-04 VITALS — BP 130/80 | HR 62 | Temp 98.0°F | Resp 16 | Ht 60.0 in | Wt 176.2 lb

## 2015-07-04 DIAGNOSIS — M10031 Idiopathic gout, right wrist: Secondary | ICD-10-CM

## 2015-07-04 LAB — BASIC METABOLIC PANEL
BUN: 60 mg/dL — AB (ref 6–23)
CHLORIDE: 103 meq/L (ref 96–112)
CO2: 33 mEq/L — ABNORMAL HIGH (ref 19–32)
Calcium: 9.4 mg/dL (ref 8.4–10.5)
Creatinine, Ser: 1.53 mg/dL — ABNORMAL HIGH (ref 0.40–1.20)
GFR: 34.36 mL/min — AB (ref >60.00)
GLUCOSE: 97 mg/dL (ref 70–99)
POTASSIUM: 4 meq/L (ref 3.5–5.1)
SODIUM: 143 meq/L (ref 135–145)

## 2015-07-04 NOTE — Progress Notes (Signed)
Pre visit review using our clinic review tool, if applicable. No additional management support is needed unless otherwise documented below in the visit note. 

## 2015-07-04 NOTE — Assessment & Plan Note (Signed)
Improved w/ colchicine and pred taper.  Pt has tolerated the increase of Allopurinol w/o difficulty.  Check BMP- adjust meds prn.  Pt expressed understanding and is in agreement w/ plan.

## 2015-07-04 NOTE — Progress Notes (Signed)
   Subjective:    Patient ID: [name], female    DOB: 05/02/1929, 82 y.o.   MRN: [id number]  HPI Gout- pt was treated last week w/ colchicine and prednisone due to flare in R wrist.  At this time, Allopurinol was increased to 1 tab daily to attempt to prevent future attacks.  Pt continues to take her prednisone as directed.  No difficulty w/ increase in Allopurinol.  Redness is much improved.  Continues to have mild tenderness but much better than before.     Review of Systems For ROS see HPI     Objective:   Physical Exam  Constitutional: She is oriented to person, place, and time. She appears well-developed and well-nourished. No distress.  HENT:  Head: Normocephalic and atraumatic.  Cardiovascular: Intact distal pulses.   Musculoskeletal: She exhibits tenderness (very mild TTP over dorsum of R wrist). She exhibits no edema (no swelling of R wrist).  Neurological: She is alert and oriented to person, place, and time.  Skin: Skin is warm and dry. No erythema.  Psychiatric: She has a normal mood and affect. Her behavior is normal. Thought content normal.  Vitals reviewed.         Assessment & Plan:

## 2015-07-04 NOTE — Patient Instructions (Signed)
Follow up as needed We'll notify you of your lab results Continue the Allopurinol 1 tab daily Finish the Prednisone as directed Call with any questions or concerns I'm so glad that you're feeling better!!!

## 2015-07-05 ENCOUNTER — Other Ambulatory Visit: Payer: Self-pay | Admitting: Family Medicine

## 2015-07-05 DIAGNOSIS — R7989 Other specified abnormal findings of blood chemistry: Secondary | ICD-10-CM

## 2015-07-20 ENCOUNTER — Ambulatory Visit: Payer: Commercial Managed Care - HMO

## 2015-07-29 ENCOUNTER — Ambulatory Visit (INDEPENDENT_AMBULATORY_CARE_PROVIDER_SITE_OTHER): Payer: Commercial Managed Care - HMO | Admitting: General Practice

## 2015-07-29 DIAGNOSIS — I4891 Unspecified atrial fibrillation: Secondary | ICD-10-CM

## 2015-07-29 DIAGNOSIS — Z5181 Encounter for therapeutic drug level monitoring: Secondary | ICD-10-CM

## 2015-07-29 LAB — POCT INR: INR: 5.8

## 2015-07-29 NOTE — Progress Notes (Signed)
Pre visit review using our clinic review tool, if applicable. No additional management support is needed unless otherwise documented below in the visit note. Patient's INR is high today.  Daughter states that she gave patient the wrong dosage this past week.  I encouraged daughter to use a pill box to help manage medication.  Patient and daughter encouraged to report any unusual bleeding or bruising.  Reiterated the dangers of INR being to high.  Daughter and patient verbalized understanding.  Dosage held through Monday and will re-check on Wednesday morning.

## 2015-07-29 NOTE — Progress Notes (Signed)
I have reviewed and agree with the plan. 

## 2015-08-03 ENCOUNTER — Ambulatory Visit (INDEPENDENT_AMBULATORY_CARE_PROVIDER_SITE_OTHER): Payer: Commercial Managed Care - HMO | Admitting: General Practice

## 2015-08-03 DIAGNOSIS — Z5181 Encounter for therapeutic drug level monitoring: Secondary | ICD-10-CM

## 2015-08-03 DIAGNOSIS — I4891 Unspecified atrial fibrillation: Secondary | ICD-10-CM | POA: Diagnosis not present

## 2015-08-03 LAB — POCT INR: INR: 1.4

## 2015-08-03 NOTE — Progress Notes (Signed)
I have reviewed and agree with the plan. 

## 2015-08-03 NOTE — Progress Notes (Signed)
Pre visit review using our clinic review tool, if applicable. No additional management support is needed unless otherwise documented below in the visit note. 

## 2015-08-17 ENCOUNTER — Ambulatory Visit (INDEPENDENT_AMBULATORY_CARE_PROVIDER_SITE_OTHER): Payer: Commercial Managed Care - HMO | Admitting: General Practice

## 2015-08-17 DIAGNOSIS — Z5181 Encounter for therapeutic drug level monitoring: Secondary | ICD-10-CM | POA: Diagnosis not present

## 2015-08-17 DIAGNOSIS — I4891 Unspecified atrial fibrillation: Secondary | ICD-10-CM | POA: Diagnosis not present

## 2015-08-17 LAB — POCT INR: INR: 2

## 2015-08-17 NOTE — Progress Notes (Signed)
Pre visit review using our clinic review tool, if applicable. No additional management support is needed unless otherwise documented below in the visit note. 

## 2015-08-17 NOTE — Progress Notes (Signed)
I have reviewed and agree with the plan. 

## 2015-09-06 ENCOUNTER — Encounter: Payer: Self-pay | Admitting: Family Medicine

## 2015-09-12 ENCOUNTER — Encounter: Payer: Self-pay | Admitting: Family Medicine

## 2015-09-12 ENCOUNTER — Ambulatory Visit (INDEPENDENT_AMBULATORY_CARE_PROVIDER_SITE_OTHER): Payer: Medicare HMO | Admitting: Family Medicine

## 2015-09-12 VITALS — BP 138/82 | HR 49 | Temp 97.9°F | Resp 17 | Ht 60.0 in | Wt 180.0 lb

## 2015-09-12 DIAGNOSIS — E785 Hyperlipidemia, unspecified: Secondary | ICD-10-CM | POA: Diagnosis not present

## 2015-09-12 DIAGNOSIS — R413 Other amnesia: Secondary | ICD-10-CM | POA: Insufficient documentation

## 2015-09-12 DIAGNOSIS — I4891 Unspecified atrial fibrillation: Secondary | ICD-10-CM

## 2015-09-12 DIAGNOSIS — Z1231 Encounter for screening mammogram for malignant neoplasm of breast: Secondary | ICD-10-CM

## 2015-09-12 DIAGNOSIS — Z0000 Encounter for general adult medical examination without abnormal findings: Secondary | ICD-10-CM | POA: Diagnosis not present

## 2015-09-12 DIAGNOSIS — I10 Essential (primary) hypertension: Secondary | ICD-10-CM

## 2015-09-12 DIAGNOSIS — Z8639 Personal history of other endocrine, nutritional and metabolic disease: Secondary | ICD-10-CM

## 2015-09-12 NOTE — Assessment & Plan Note (Signed)
Chronic problem.  Tolerating statin w/o difficulty.  Check labs.  Adjust meds prn  

## 2015-09-12 NOTE — Assessment & Plan Note (Signed)
Pt has hx of diabetes but has never been on medication.  A1Cs have been <6.5 for years so we transitioned this to historical rather than active but we continue to monitor A1C at least yearly.  Encouraged low carb diet and activity as able.  Will follow.

## 2015-09-12 NOTE — Assessment & Plan Note (Signed)
Chronic problem.  Well controlled today.  Asymptomatic.  Check labs.  No anticipated med changes. 

## 2015-09-12 NOTE — Assessment & Plan Note (Signed)
Pt's PE unchanged w/ exception of poor memory.  Due for mammo- order entered.  Pt declined vaccines.  No need for colonoscopy and pt refuses.  Written screening schedule updated and given to pt.  Check labs.  Anticipatory guidance provided.

## 2015-09-12 NOTE — Patient Instructions (Signed)
Follow up in 6 months to recheck BP and cholesterol Get your [facility] drawn at [facility] Wednesday- we'll notify you of your lab results and make any changes if needed Try and add some structure to your day- set a wake up time, schedule an activity, etc We will call you with your mammogram appt We will call you with your neuro appt to evaluate your memory difficulties You have declined the pneumonia shot Try and walk more to build your stamina Start a daily probiotic to improve your stools Call with any questions or concerns Hang in there!!!

## 2015-09-12 NOTE — Assessment & Plan Note (Signed)
New.  There has been a rapid decline since pt stopped working and moved in w/ her daughter.  Discussed possibility of pseudo-dementia but pt denies depression.  Daughter feels she may be depressed but doesn't want to force the issue w/ mom.  Encouraged pt and mom to set a schedule for pt to get her move involved in her day and more engaged.  Will refer to neuro for complete evaluation.  Pt expressed understanding and is in agreement w/ plan.

## 2015-09-12 NOTE — Assessment & Plan Note (Signed)
Chronic problem.  Rate controlled on beta blocker.  On Coumadin.  Currently asymptomatic.  Will follow along.

## 2015-09-12 NOTE — Progress Notes (Signed)
   Subjective:    Patient ID: [name], female    DOB: Jul 07, 1929, 82 y.o.   MRN: [id number]  HPI Here today for CPE.  Risk Factors: Hyperlipidemia- chronic problem, on Simvastatin HTN- chronic problem, on Amlodipine, Hydralazine, Propranolol w/ good control.   Hx of diet controlled DM- A fib- chronic problem, following w/ Dr Ladona Ridgelaylor.  On Coumadin.  Rate controlled w/ Propranolol. Physical Activity: limited Fall Risk: elevated due to shuffling gait and ambulation w/ cane. Depression: denies current sxs Hearing: decreased to conversational tones ADL's: pt is having some assistance w/ dressing and bathing Cognitive: very poor memory but doing Sudoku regularly Home Safety: safe at home, lives w/ daughter Height, Weight, BMI, Visual Acuity: see vitals, vision corrected to 20/20 w/ glasses Counseling: due for mammo and colonoscopy (pt not interested).  Due for Prevnar- 'i don't take those'. Care team reviewed and updated w/ pt Labs Ordered: See A&P Care Plan: See A&P    Review of Systems Patient reports no vision/ hearing changes, adenopathy,fever, weight change,  persistant/recurrent hoarseness , swallowing issues, chest pain, palpitations, edema, persistant/recurrent cough, hemoptysis, dyspnea (rest/exertional/paroxysmal nocturnal), gastrointestinal bleeding (melena, rectal bleeding), abdominal pain, significant heartburn, bowel changes, GU symptoms (dysuria, hematuria, incontinence), Gyn symptoms (abnormal  bleeding, pain),  syncope, focal weakness, numbness & tingling, skin/hair/nail changes, abnormal bruising or bleeding, anxiety, or depression.   + memory loss- daughter is concerned about her rapid decline    Objective:   Physical Exam General Appearance:    Alert, cooperative, no distress, appears stated age, obese  Head:    Normocephalic, without obvious abnormality, atraumatic  Eyes:    PERRL, conjunctiva/corneas clear, EOM's intact, fundi    benign, both eyes  Ears:     Normal TM's and external ear canals, both ears  Nose:   Nares normal, septum midline, mucosa normal, no drainage    or sinus tenderness  Throat:   Lips, mucosa, and tongue normal; teeth and gums normal  Neck:   Supple, symmetrical, trachea midline, no adenopathy;    Thyroid: no enlargement/tenderness/nodules  Back:     Symmetric, no curvature, ROM normal, no CVA tenderness  Lungs:     Clear to auscultation bilaterally, respirations unlabored  Chest Wall:    No tenderness or deformity   Heart:    Irregularly irregular S1 and S2 normal, no murmur, rub   or gallop  Breast Exam:    Deferred to mammo  Abdomen:     Soft, non-tender, bowel sounds active all four quadrants,    no masses, no organomegaly  Genitalia:    Deferred  Rectal:    Extremities:   Extremities normal, atraumatic, no cyanosis or edema  Pulses:   2+ and symmetric all extremities  Skin:   Skin color, texture, turgor normal, no rashes or lesions  Lymph nodes:   Cervical, supraclavicular, and axillary nodes normal  Neurologic:   CNII-XII intact, normal strength, sensation and reflexes    throughout          Assessment & Plan:

## 2015-09-14 ENCOUNTER — Other Ambulatory Visit (INDEPENDENT_AMBULATORY_CARE_PROVIDER_SITE_OTHER): Payer: Medicare HMO

## 2015-09-14 ENCOUNTER — Ambulatory Visit (INDEPENDENT_AMBULATORY_CARE_PROVIDER_SITE_OTHER): Payer: Medicare HMO | Admitting: General Practice

## 2015-09-14 DIAGNOSIS — I10 Essential (primary) hypertension: Secondary | ICD-10-CM | POA: Diagnosis not present

## 2015-09-14 DIAGNOSIS — Z8639 Personal history of other endocrine, nutritional and metabolic disease: Secondary | ICD-10-CM

## 2015-09-14 DIAGNOSIS — E785 Hyperlipidemia, unspecified: Secondary | ICD-10-CM | POA: Diagnosis not present

## 2015-09-14 DIAGNOSIS — Z5181 Encounter for therapeutic drug level monitoring: Secondary | ICD-10-CM | POA: Diagnosis not present

## 2015-09-14 DIAGNOSIS — I4891 Unspecified atrial fibrillation: Secondary | ICD-10-CM | POA: Diagnosis not present

## 2015-09-14 LAB — LIPID PANEL
CHOLESTEROL: 148 mg/dL (ref 0–200)
HDL: 36.1 mg/dL — AB (ref >39.00)
LDL CALC: 86 mg/dL (ref 0–99)
NonHDL: 111.53
TRIGLYCERIDES: 128 mg/dL (ref 0.0–149.0)
Total CHOL/HDL Ratio: 4
VLDL: 25.6 mg/dL (ref 0.0–40.0)

## 2015-09-14 LAB — TSH: TSH: 8 u[IU]/mL — ABNORMAL HIGH (ref 0.35–4.50)

## 2015-09-14 LAB — HEPATIC FUNCTION PANEL
ALBUMIN: 4 g/dL (ref 3.5–5.2)
ALT: 20 U/L (ref 0–35)
AST: 27 U/L (ref 0–37)
Alkaline Phosphatase: 63 U/L (ref 39–117)
BILIRUBIN DIRECT: 0.1 mg/dL (ref 0.0–0.3)
TOTAL PROTEIN: 6.9 g/dL (ref 6.0–8.3)
Total Bilirubin: 0.7 mg/dL (ref 0.2–1.2)

## 2015-09-14 LAB — BASIC METABOLIC PANEL
BUN: 44 mg/dL — AB (ref 6–23)
CALCIUM: 9.7 mg/dL (ref 8.4–10.5)
CO2: 30 meq/L (ref 19–32)
CREATININE: 1.63 mg/dL — AB (ref 0.40–1.20)
Chloride: 103 mEq/L (ref 96–112)
GFR: 31.92 mL/min — ABNORMAL LOW (ref >60.00)
GLUCOSE: 120 mg/dL — AB (ref 70–99)
Potassium: 4.1 mEq/L (ref 3.5–5.1)
SODIUM: 144 meq/L (ref 135–145)

## 2015-09-14 LAB — CBC WITH DIFFERENTIAL/PLATELET
BASOS ABS: 0 10*3/uL (ref 0.0–0.1)
Basophils Relative: 0.3 % (ref 0.0–3.0)
EOS ABS: 0.1 10*3/uL (ref 0.0–0.7)
Eosinophils Relative: 1.6 % (ref 0.0–5.0)
HEMATOCRIT: 37.4 % (ref 36.0–46.0)
Hemoglobin: 12.2 g/dL (ref 12.0–15.0)
LYMPHS PCT: 22.3 % (ref 12.0–46.0)
Lymphs Abs: 1.2 10*3/uL (ref 0.7–4.0)
MCHC: 32.5 g/dL (ref 30.0–36.0)
MCV: 87.5 fl (ref 78.0–100.0)
Monocytes Absolute: 0.5 10*3/uL (ref 0.1–1.0)
Monocytes Relative: 8.5 % (ref 3.0–12.0)
NEUTROS ABS: 3.7 10*3/uL (ref 1.4–7.7)
NEUTROS PCT: 67.3 % (ref 43.0–77.0)
PLATELETS: 152 10*3/uL (ref 150.0–400.0)
RBC: 4.27 Mil/uL (ref 3.87–5.11)
RDW: 18.5 % — ABNORMAL HIGH (ref 11.5–15.5)
WBC: 5.6 10*3/uL (ref 4.0–10.5)

## 2015-09-14 LAB — POCT INR: INR: 2.2

## 2015-09-14 LAB — HEMOGLOBIN A1C: Hgb A1c MFr Bld: 6.2 % (ref 4.6–6.5)

## 2015-09-14 NOTE — Progress Notes (Signed)
I have reviewed and agree with the plan. 

## 2015-09-14 NOTE — Progress Notes (Signed)
Pre visit review using our clinic review tool, if applicable. No additional management support is needed unless otherwise documented below in the visit note. 

## 2015-09-15 ENCOUNTER — Other Ambulatory Visit: Payer: Self-pay | Admitting: General Practice

## 2015-09-15 DIAGNOSIS — R7989 Other specified abnormal findings of blood chemistry: Secondary | ICD-10-CM

## 2015-09-15 MED ORDER — LEVOTHYROXINE SODIUM 50 MCG PO TABS: 50.0000 ug | ORAL_TABLET | Freq: Every day | ORAL | Status: DC

## 2015-10-07 ENCOUNTER — Ambulatory Visit: Payer: Medicare HMO | Admitting: Neurology

## 2015-10-12 ENCOUNTER — Ambulatory Visit (INDEPENDENT_AMBULATORY_CARE_PROVIDER_SITE_OTHER): Payer: Commercial Managed Care - HMO | Admitting: General Practice

## 2015-10-12 DIAGNOSIS — I4891 Unspecified atrial fibrillation: Secondary | ICD-10-CM

## 2015-10-12 DIAGNOSIS — Z5181 Encounter for therapeutic drug level monitoring: Secondary | ICD-10-CM

## 2015-10-12 LAB — POCT INR: INR: 2.4

## 2015-11-23 ENCOUNTER — Ambulatory Visit (INDEPENDENT_AMBULATORY_CARE_PROVIDER_SITE_OTHER): Payer: Self-pay | Admitting: General Practice

## 2015-11-23 DIAGNOSIS — Z5181 Encounter for therapeutic drug level monitoring: Secondary | ICD-10-CM

## 2015-11-23 DIAGNOSIS — I4891 Unspecified atrial fibrillation: Secondary | ICD-10-CM

## 2015-11-23 LAB — POCT INR: INR: 2.4

## 2015-11-24 NOTE — Progress Notes (Signed)
I have reviewed and agree with the plan. 

## 2015-11-29 DIAGNOSIS — Z029 Encounter for administrative examinations, unspecified: Secondary | ICD-10-CM

## 2015-12-04 ENCOUNTER — Other Ambulatory Visit: Payer: Self-pay | Admitting: Family Medicine

## 2015-12-06 ENCOUNTER — Encounter: Payer: Self-pay | Admitting: Family Medicine

## 2015-12-06 ENCOUNTER — Ambulatory Visit: Payer: Medicare HMO | Admitting: Neurology

## 2015-12-06 DIAGNOSIS — R413 Other amnesia: Secondary | ICD-10-CM

## 2015-12-12 ENCOUNTER — Encounter: Payer: Self-pay | Admitting: Diagnostic Neuroimaging

## 2015-12-12 ENCOUNTER — Ambulatory Visit (INDEPENDENT_AMBULATORY_CARE_PROVIDER_SITE_OTHER): Payer: Commercial Managed Care - HMO | Admitting: Diagnostic Neuroimaging

## 2015-12-12 VITALS — BP 118/50 | HR 44 | Ht 59.5 in | Wt 180.2 lb

## 2015-12-12 DIAGNOSIS — F0390 Unspecified dementia without behavioral disturbance: Secondary | ICD-10-CM | POA: Diagnosis not present

## 2015-12-12 DIAGNOSIS — F03B0 Unspecified dementia, moderate, without behavioral disturbance, psychotic disturbance, mood disturbance, and anxiety: Secondary | ICD-10-CM

## 2015-12-12 NOTE — Progress Notes (Signed)
[facility]  PATIENT: [name] DOB: Mar 11, 1929  REFERRING CLINICIAN: Cordelia Pen HISTORY FROM: patient and daughter  REASON FOR VISIT: new consult    HISTORICAL  CHIEF COMPLAINT:  Chief Complaint  Patient presents with  . Memory Loss    rm 7, New Pt, dgtr- [name], MMSE 17    HISTORY OF PRESENT ILLNESS:   82 year old right-handed female with congestive heart failure, chronic kidney disease, asthma, hypertension, atrial fibrillation here for evaluation of memory loss problems.  Patient denies any memory problems. She does recognize that her daughter has noticed some memory problems. According to daughter patient has been having progressive decline over the past 10 years, initially mainly on the physical basis. Patient's daughter was helping her with house chores over the past 10 years. In the past 3 years patient's daughter has been helping prepare and fix meals for her. 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 Patient had to retire in October 2016 due to difficulty in keeping up with her job on the physical basis. At this point patient was still living independently. In January 2017 patient was sick and in the hospital, and upon discharge patient moved in with her daughter. Since that time patient has been having more problems with following directions, having short-term memory problems, being more withdrawn, having more depression.    REVIEW OF SYSTEMS: Full 14 system review of systems performed and negative with exception of: Fatigue shortness of breath incontinence diarrhea too much sleep decreased energy disinterest activities confusion sleepiness.  ALLERGIES: No Known Allergies  HOME MEDICATIONS: Outpatient Medications Prior to Visit  Medication Sig Dispense Refill  . albuterol (VENTOLIN HFA) 108 (90 BASE) MCG/ACT inhaler INHALE TWO PUFFS  EVERY 4 HOURS AS NEEDED FOR COUGH AND  WHEEZING 18 g 2  . allopurinol (ZYLOPRIM) 100 MG tablet Take 1 tablet (100 mg total) by mouth daily. Start on April 8th, 2017 30 tablet 6  . amLODipine (NORVASC) 5 MG tablet Take 10 mg by mouth at bedtime.     . colchicine 0.6 MG tablet 2 tabs x1 dose and then 1 tab 1 hr later.  Repeat in 3 days if needed. 9 tablet 0  . furosemide (LASIX) 40 MG tablet Take 1 tablet (40 mg total) by mouth daily. 30 tablet 3  . hydrALAZINE (APRESOLINE) 25 MG tablet Pt takes 1 tablet in the am and 2 tablets in the evening    . levothyroxine (SYNTHROID, LEVOTHROID) 50 MCG tablet Take 1 tablet (50 mcg total) by mouth daily. 30 tablet 6  . predniSONE (DELTASONE) 10 MG tablet 3 tabs x3 days and then 2 tabs x3 days and then 1 tab x3 days.  Take w/ food. 18 tablet 0  . propranolol (INDERAL) 20 MG tablet TAKE ONE TABLET BY MOUTH TWICE DAILY 180 tablet 1  . simvastatin (ZOCOR) 40 MG tablet TAKE ONE-HALF TABLET BY MOUTH AT BEDTIME 30 tablet 6  . warfarin (COUMADIN) 3 MG tablet Take 2 tablets (6 mg total) by mouth daily. 60 tablet 6  . acetaminophen (TYLENOL) 500 MG tablet Take 1,000 mg by mouth every 6 (six) hours as needed for mild pain. Reported on 05/24/2015     No facility-administered medications prior to visit.     PAST MEDICAL HISTORY: Past Medical History:  Diagnosis Date  . Atrial fibrillation (HCC)   . CHF (congestive heart  failure) (HCC)   . CKD (chronic kidney disease)    stage 3  . DIABETES-TYPE 2    12/12/15 dgtr unsure  . DYSPNEA ON EXERTION   . Edema   . GERD   . HYPERTENSION   . Intrinsic asthma, unspecified   . KNEE PAIN   . OBESITY   . Other and unspecified hyperlipidemia     PAST SURGICAL HISTORY: Past Surgical History:  Procedure Laterality Date  . ABDOMINAL HYSTERECTOMY    . I&D EXTREMITY Right 03/13/2015   Procedure: IRRIGATION AND DEBRIDEMENT RIGHT WRIST;  Surgeon: Knute Neu, MD;  Location: [facility];  Service: Plastics;  Laterality: Right;  .  KNEE SURGERY Left    arthroscopic    FAMILY HISTORY: Family History  Problem Relation Age of Onset  . Coronary artery disease Mother   . Hypertension Mother   . Stroke Mother   . Heart attack Mother   . Hypertension Father   . Prostate cancer Father   . Stroke Father   . Heart attack Father   . Cancer Brother     throat    SOCIAL HISTORY:  Social History   Social History  . Marital status: Divorced    Spouse name: N/A  . Number of children: 3  . Years of education: 32   Occupational History  .      retired   Social History Main Topics  . Smoking status: Never Smoker  . Smokeless tobacco: Never Used  . Alcohol use No  . Drug use: No  . Sexual activity: No   Other Topics Concern  . Not on file   Social History Narrative   Lives with daughter   No caffeine use     PHYSICAL EXAM  GENERAL EXAM/CONSTITUTIONAL: Vitals:  Vitals:   12/12/15 1141  BP: (!) 118/50  Pulse: (!) 44  Weight: 180 lb 3.2 oz (81.7 kg)  Height: 4' 11.5" (1.511 m)     Body mass index is 35.79 kg/m.  No exam data present  Patient is in no distress; well developed, nourished and groomed; neck is supple  CARDIOVASCULAR:  Examination of carotid arteries is normal; no carotid bruits  Regular rate and rhythm, no murmurs  Examination of peripheral vascular system by observation and palpation is normal  EYES:  Ophthalmoscopic exam of optic discs and posterior segments is normal; no papilledema or hemorrhages  MUSCULOSKELETAL:  Gait, strength, tone, movements noted in Neurologic exam below  NEUROLOGIC: MENTAL STATUS:  MMSE - Mini Mental State Exam 12/12/2015  Orientation to time 0  Orientation to Place 4  Registration 3  Attention/ Calculation 2  Recall 0  Language- name 2 objects 2  Language- repeat 0  Language- follow 3 step command 3  Language- read & follow direction 1  Write a sentence 1  Copy design 1  Total score 17    awake, alert, oriented to person,  place and time  DECR MEMORY   DECR attention and concentration  DECR FLUENCY; comprehension intact, naming intact,   fund of knowledge appropriate  CRANIAL NERVE:   2nd - no papilledema on fundoscopic exam  2nd, 3rd, 4th, 6th - pupils equal and reactive to light, visual fields full to confrontation, extraocular muscles intact, no nystagmus  5th - facial sensation symmetric  7th - facial strength symmetric  8th - hearing intact  9th - palate elevates symmetrically, uvula midline  11th - shoulder shrug symmetric  12th - tongue protrusion midline  MOTOR:   normal  bulk and tone, full strength in the BUE, BLE  SENSORY:   normal and symmetric to light touch, temperature, vibration  COORDINATION:   finger-nose-finger, fine finger movements normal  REFLEXES:   deep tendon reflexes present and symmetric  GAIT/STATION:   narrow based gait; USES WALKER    DIAGNOSTIC DATA (LABS, IMAGING, TESTING) - I reviewed patient records, labs, notes, testing and imaging myself where available.  Lab Results  Component Value Date   WBC 5.6 09/14/2015   HGB 12.2 09/14/2015   HCT 37.4 09/14/2015   MCV 87.5 09/14/2015   PLT 152.0 09/14/2015      Component Value Date/Time   NA 144 09/14/2015 1148   K 4.1 09/14/2015 1148   CL 103 09/14/2015 1148   CO2 30 09/14/2015 1148   GLUCOSE 120 (H) 09/14/2015 1148   GLUCOSE 126 07/02/2006   BUN 44 (H) 09/14/2015 1148   CREATININE 1.63 (H) 09/14/2015 1148   CREATININE 1.41 (H) 12/29/2010 1645   CALCIUM 9.7 09/14/2015 1148   PROT 6.9 09/14/2015 1148   ALBUMIN 4.0 09/14/2015 1148   AST 27 09/14/2015 1148   ALT 20 09/14/2015 1148   ALKPHOS 63 09/14/2015 1148   BILITOT 0.7 09/14/2015 1148   GFRNONAA 38 (L) 04/10/2015 0055   GFRAA 44 (L) 04/10/2015 0055   Lab Results  Component Value Date   CHOL 148 09/14/2015   HDL 36.10 (L) 09/14/2015   LDLCALC 86 09/14/2015   LDLDIRECT 168.9 12/07/2008   TRIG 128.0 09/14/2015   CHOLHDL 4  09/14/2015   Lab Results  Component Value Date   HGBA1C 6.2 09/14/2015   No results found for: [id number] Lab Results  Component Value Date   TSH 8.00 (H) 09/14/2015       ASSESSMENT AND PLAN  82 y.o. year old female here with Progressive physical and cognitive decline over several years, particularly in 2017. Most likely represents underlying, incipient neurodegenerative dementia. MMSE 17 of 30. Patient also has other comorbid medical conditions. Will check MRI of the brain to rule out secondary causes. Patient has abnormal TSH from July 2017 and is planned to have repeat level with PCP. We'll request that PCP check B12 level at that time as well.  Reviewed options for safety and supervision and community resources with patient's daughter.   Dx:  1. Moderate dementia without behavioral disturbance      PLAN: - check MRI brain - ask PCP to check TSH and B12 at next visit - continue safety and supervision (per daughter) - consider adult center for enrichment or other day program - consider home health agency referral / home aid - discussed advanced care planning; may benefit for from palliative care   Orders Placed This Encounter  Procedures  . MR BRAIN WO CONTRAST   Return in about 4 months (around 04/13/2016).    Suanne MarkerVIKRAM R. Oronde Hallenbeck, MD 12/12/2015, 12:13 PM Certified in Neurology, Neurophysiology and Neuroimaging  [facility] [address], [address] [place], [place] [address] [phone]

## 2015-12-12 NOTE — Patient Instructions (Addendum)
[facility] you for [facility] at [facility]. I hope we have been able to provide you high quality care today.  You may receive a patient satisfaction survey over the next few weeks. We would appreciate your feedback and comments so that we may continue to improve ourselves and the health of our patients.  - check MRI brain  - ask PCP to check TSH and B12 at next visit  - continue safety and supervision  - consider adult center for enrichment or other day program  - consider home health agency referral / home aid   PLANNING Prepare estate planning, living will, healthcare POA documents. Sometimes this is best planned with the help of an attorney. Theconversationproject.org and agingwithdignity.org are excellent resources.

## 2015-12-23 ENCOUNTER — Ambulatory Visit
Admission: RE | Admit: 2015-12-23 | Discharge: 2015-12-23 | Disposition: A | Discharge status: Home / Self Care | Payer: Commercial Managed Care - HMO | Source: Ambulatory Visit | Attending: Diagnostic Neuroimaging | Admitting: Diagnostic Neuroimaging

## 2015-12-23 DIAGNOSIS — F0390 Unspecified dementia without behavioral disturbance: Secondary | ICD-10-CM | POA: Diagnosis not present

## 2015-12-23 DIAGNOSIS — F03B0 Unspecified dementia, moderate, without behavioral disturbance, psychotic disturbance, mood disturbance, and anxiety: Secondary | ICD-10-CM

## 2015-12-23 DIAGNOSIS — R410 Disorientation, unspecified: Secondary | ICD-10-CM | POA: Diagnosis not present

## 2016-01-03 ENCOUNTER — Telehealth: Payer: Self-pay | Admitting: Diagnostic Neuroimaging

## 2016-01-03 NOTE — Telephone Encounter (Signed)
Pt's daughter requests MRI results

## 2016-01-04 ENCOUNTER — Ambulatory Visit (INDEPENDENT_AMBULATORY_CARE_PROVIDER_SITE_OTHER): Payer: Commercial Managed Care - HMO | Admitting: General Practice

## 2016-01-04 DIAGNOSIS — I4891 Unspecified atrial fibrillation: Secondary | ICD-10-CM

## 2016-01-04 DIAGNOSIS — Z5181 Encounter for therapeutic drug level monitoring: Secondary | ICD-10-CM

## 2016-01-04 LAB — POCT INR: INR: 3

## 2016-01-04 NOTE — Telephone Encounter (Signed)
I spoke with patient's daughter and gave MRI results. Answered questions about dementia diagnosis and prognosis. Continue current plan. -VRP

## 2016-01-04 NOTE — Progress Notes (Signed)
I have reviewed and agree with the plan. 

## 2016-01-04 NOTE — Patient Instructions (Signed)
Pre visit review using our clinic review tool, if applicable. No additional management support is needed unless otherwise documented below in the visit note. 

## 2016-01-16 ENCOUNTER — Encounter: Payer: Self-pay | Admitting: Physician Assistant

## 2016-01-16 ENCOUNTER — Ambulatory Visit: Payer: Commercial Managed Care - HMO | Admitting: Family Medicine

## 2016-01-16 ENCOUNTER — Ambulatory Visit (INDEPENDENT_AMBULATORY_CARE_PROVIDER_SITE_OTHER): Payer: Commercial Managed Care - HMO | Admitting: Physician Assistant

## 2016-01-16 VITALS — BP 122/62 | HR 40 | Temp 98.6°F | Resp 14 | Ht 60.0 in | Wt 181.0 lb

## 2016-01-16 DIAGNOSIS — R05 Cough: Secondary | ICD-10-CM

## 2016-01-16 DIAGNOSIS — N183 Chronic kidney disease, stage 3 unspecified: Secondary | ICD-10-CM

## 2016-01-16 DIAGNOSIS — R058 Other specified cough: Secondary | ICD-10-CM

## 2016-01-16 DIAGNOSIS — E039 Hypothyroidism, unspecified: Secondary | ICD-10-CM

## 2016-01-16 DIAGNOSIS — R001 Bradycardia, unspecified: Secondary | ICD-10-CM

## 2016-01-16 LAB — BASIC METABOLIC PANEL
BUN: 61 mg/dL — AB (ref 6–23)
CHLORIDE: 107 meq/L (ref 96–112)
CO2: 28 meq/L (ref 19–32)
CREATININE: 1.78 mg/dL — AB (ref 0.40–1.20)
Calcium: 9.5 mg/dL (ref 8.4–10.5)
GFR: 28.82 mL/min — ABNORMAL LOW (ref >60.00)
Glucose, Bld: 76 mg/dL (ref 70–99)
POTASSIUM: 4.4 meq/L (ref 3.5–5.1)
Sodium: 146 mEq/L — ABNORMAL HIGH (ref 135–145)

## 2016-01-16 LAB — TSH: TSH: 3.38 u[IU]/mL (ref 0.35–4.50)

## 2016-01-16 MED ORDER — FLUTICASONE PROPIONATE 50 MCG/ACT NA SUSP: 2.0000 | Freq: Every day | NASAL | 6 refills | Status: AC

## 2016-01-16 MED ORDER — ALBUTEROL SULFATE HFA 108 (90 BASE) MCG/ACT IN AERS: INHALATION_SPRAY | RESPIRATORY_TRACT | 2 refills | Status: AC

## 2016-01-16 NOTE — Patient Instructions (Addendum)
Please start Flonase daily. Continue Zyrtec daily as directed. Start saline nasal spray to flush out nasal passages.  Cough drops as needed.  Please stop the Propranolol. Heart rate is too low and is likely contributing to fatigue and windedness with exertion. Check heart rate and BP at home. Record these. Follow-up in 1 week. Return immediately if any new symptoms develop or if heart rate is not improving with discontinuation of medication.  If you note any chest pain, dizziness or severe shortness of breath, please go to the ER.

## 2016-01-16 NOTE — Progress Notes (Addendum)
Patient presents to clinic today c/o 3-4 weeks of rhinorrhea, PND and a week of cough that is dry. Denies fever, chills, sinus pressure, sinus pain, ear pressure/pain. Notes some chest tightness and slight wheezing from time to time. Has Albuterol inhaler but ran out of medication several months ago.Patient denies SOB or chest pain.   Daughter notes patient's pulse has been low at home (averaging in the 30s-40s). Notes patient complains of lightheadedness every so often when standing. Has not been checking BP at home. Patient with history of Grade I Diastolic HF noted on echo in 2013. Denies leg swelling, PND or orthopnea. Denies chest pain, SOB, lightheadedness or dizziness at present.   Of note, patient with history of hypothyroidism, currently on levothyroxine 50 mcg daily. Is overdue for TSH level and would like drawn today. Also due for repeat Creatinine check as last Cr was elevated from her baseline Cr. Endorses good intake and output.   Past Medical History:  Diagnosis Date  . Atrial fibrillation (HCC)   . CHF (congestive heart failure) (HCC)   . CKD (chronic kidney disease)    stage 3  . DIABETES-TYPE 2    12/12/15 dgtr unsure  . DYSPNEA ON EXERTION   . Edema   . GERD   . HYPERTENSION   . Intrinsic asthma, unspecified   . KNEE PAIN   . OBESITY   . Other and unspecified hyperlipidemia     Current Outpatient Prescriptions on File Prior to Visit  Medication Sig Dispense Refill  . allopurinol (ZYLOPRIM) 100 MG tablet Take 1 tablet (100 mg total) by mouth daily. Start on April 8th, 2017 30 tablet 6  . amLODipine (NORVASC) 5 MG tablet Take 10 mg by mouth at bedtime.     . colchicine 0.6 MG tablet 2 tabs x1 dose and then 1 tab 1 hr later.  Repeat in 3 days if needed. 9 tablet 0  . furosemide (LASIX) 40 MG tablet Take 1 tablet (40 mg total) by mouth daily. 30 tablet 3  . hydrALAZINE (APRESOLINE) 25 MG tablet Pt takes 1 tablet in the am and 2 tablets in the evening    .  levothyroxine (SYNTHROID, LEVOTHROID) 50 MCG tablet Take 1 tablet (50 mcg total) by mouth daily. 30 tablet 6  . simvastatin (ZOCOR) 40 MG tablet TAKE ONE-HALF TABLET BY MOUTH AT BEDTIME 30 tablet 6  . warfarin (COUMADIN) 3 MG tablet Take 2 tablets (6 mg total) by mouth daily. 60 tablet 6   No current facility-administered medications on file prior to visit.     No Known Allergies  Family History  Problem Relation Age of Onset  . Coronary artery disease Mother   . Hypertension Mother   . Stroke Mother   . Heart attack Mother   . Hypertension Father   . Prostate cancer Father   . Stroke Father   . Heart attack Father   . Cancer Brother     throat    Social History   Social History  . Marital status: Divorced    Spouse name: N/A  . Number of children: 3  . Years of education: 4213   Occupational History  .      retired   Social History Main Topics  . Smoking status: Never Smoker  . Smokeless tobacco: Never Used  . Alcohol use No  . Drug use: No  . Sexual activity: No   Other Topics Concern  . None   Social History Narrative   Lives  with daughter   No caffeine use   Review of Systems - See HPI.  All other ROS are negative.  BP 122/62   Pulse (!) 40   Temp 98.6 F (37 C) (Oral)   Resp 14   Ht 5' (1.524 m)   Wt 181 lb (82.1 kg)   SpO2 95%   BMI 35.35 kg/m   Physical Exam  Constitutional: She is oriented to person, place, and time and well-developed, well-nourished, and in no distress.  HENT:  Head: Normocephalic and atraumatic.  Right Ear: Tympanic membrane and external ear normal.  Left Ear: Tympanic membrane and external ear normal.  Nose: Rhinorrhea present. No mucosal edema. Right sinus exhibits no maxillary sinus tenderness and no frontal sinus tenderness. Left sinus exhibits no maxillary sinus tenderness and no frontal sinus tenderness.  Mouth/Throat: Uvula is midline, oropharynx is clear and moist and mucous membranes are normal.  Eyes:  Conjunctivae are normal.  Cardiovascular: Regular rhythm, normal heart sounds and intact distal pulses.  Bradycardia present.   Pulses:      Dorsalis pedis pulses are 2+ on the right side, and 2+ on the left side.       Posterior tibial pulses are 2+ on the right side, and 2+ on the left side.  Pulmonary/Chest: Effort normal and breath sounds normal. No respiratory distress. She has no wheezes. She has no rales. She exhibits no tenderness.  Neurological: She is alert and oriented to person, place, and time.  Skin: Skin is warm and dry. No rash noted.  Psychiatric: Affect normal.  Vitals reviewed.   Recent Results (from the past 2160 hour(s))  POCT INR     Status: None   Collection Time: 11/23/15 12:00 AM  Result Value Ref Range   INR 2.4   POCT INR     Status: None   Collection Time: 01/04/16 12:00 AM  Result Value Ref Range   INR 3.0     Assessment/Plan: 1. Dry cough Seems secondary to PND. Lungs CTAB. Respirations and O2 stable. Will continue Zyrtec and start saline nasal spray. Start Flonase daily. Albuterol refilled to use as directed for chest tightness.FU scheduled for 1 week but strict return precautions given if anything worsens or new symptoms develop. - albuterol (VENTOLIN HFA) 108 (90 Base) MCG/ACT inhaler; INHALE TWO PUFFS EVERY 4 HOURS AS NEEDED FOR COUGH AND  WHEEZING  Dispense: 18 g; Refill: 2  2. CKD (chronic kidney disease), stage III Hydrating and urinating well per patient. Will repeat creatinine to assess stability. - Basic metabolic panel  3. Hypothyroidism, unspecified type Repeat TSH today. - TSH  4. Bradycardia Asymptomatic at present. Occasional lightheadedness. BP stable. OVS negative. Discussed need to discontinue or reduce Propranolol. Discussed with PCP who recommends discontinuation giving severe bradycardia and absence of history of CAD or coronary event. Medication discontinued. Daughter to keep check on pulse and record. If not improving with  cessation of medication, will need Cardiology assessment for pacemaker. Discussed alarm signs/symptoms with patient and daughter that would indicate ER assessment. They voice understanding. FU 1 week. Return sooner if needed.   Piedad ClimesMartin, Stepheny Canal Cody, PA-C

## 2016-01-16 NOTE — Progress Notes (Signed)
Pre visit review using our clinic review tool, if applicable. No additional management support is needed unless otherwise documented below in the visit note. 

## 2016-01-17 ENCOUNTER — Telehealth: Payer: Self-pay | Admitting: Physician Assistant

## 2016-01-17 ENCOUNTER — Other Ambulatory Visit: Payer: Self-pay | Admitting: Family Medicine

## 2016-01-17 DIAGNOSIS — N184 Chronic kidney disease, stage 4 (severe): Secondary | ICD-10-CM

## 2016-01-17 NOTE — Telephone Encounter (Signed)
Referral placed as urgent [facility] 51 [facility]. Patient previously seen by Dr. Melony OverlyMatingly.

## 2016-01-17 NOTE — Telephone Encounter (Signed)
-----   Message from Con MemosPatina S [name], [name] sent at 01/17/2016  8:55 AM EST ----- Spoke with patient daughter [name] of lab results, thyroid is stable to continue current medication, kidney functions has declined. Encouraged to increase fluid intake She is not currently seeing a Nephrologist at this time. Patient daughter is agreeable with referral to Nephrologist. Referral will be placed.

## 2016-01-23 ENCOUNTER — Ambulatory Visit (INDEPENDENT_AMBULATORY_CARE_PROVIDER_SITE_OTHER): Payer: Commercial Managed Care - HMO | Admitting: Family Medicine

## 2016-01-23 ENCOUNTER — Encounter: Payer: Self-pay | Admitting: Family Medicine

## 2016-01-23 VITALS — BP 120/52 | HR 64 | Temp 99.0°F | Resp 16 | Ht 60.0 in | Wt 179.4 lb

## 2016-01-23 DIAGNOSIS — N183 Chronic kidney disease, stage 3 (moderate): Secondary | ICD-10-CM | POA: Diagnosis not present

## 2016-01-23 DIAGNOSIS — R001 Bradycardia, unspecified: Secondary | ICD-10-CM | POA: Diagnosis not present

## 2016-01-23 DIAGNOSIS — I10 Essential (primary) hypertension: Secondary | ICD-10-CM | POA: Diagnosis not present

## 2016-01-23 NOTE — Patient Instructions (Signed)
Follow up as scheduled in January- sooner if needed Continue to hold the Propranolol- she does not need this Continue the daily allergy meds since this has resolved the cough Call with any questions or concerns Happy Holidays!!!

## 2016-01-23 NOTE — Progress Notes (Signed)
Pre visit review using our clinic review tool, if applicable. No additional management support is needed unless otherwise documented below in the visit note. 

## 2016-01-23 NOTE — Progress Notes (Signed)
   Subjective:    Patient ID: Almedia BallsWanda C Schumacher, female    DOB: April 17, 1929, 82 y.o.   MRN: [id number]  HPI Bradycardia- pt's HR was 40 at last visit and propranolol was stopped.  HR is 64 today.  Pt reports increased energy since stopping medicine.  Pt reports confusion has improved since stopping medication.  Denies CP, SOB, edema.    Cough- this has resolved w/ tx of seasonal allergies.   Review of Systems For ROS see HPI     Objective:   Physical Exam  Constitutional: She is oriented to person, place, and time. She appears well-developed and well-nourished. No distress.  HENT:  Head: Normocephalic and atraumatic.  Eyes: Conjunctivae and EOM are normal. Pupils are equal, round, and reactive to light.  Neck: Normal range of motion. Neck supple. No thyromegaly present.  Cardiovascular: Normal rate, regular rhythm, normal heart sounds and intact distal pulses.   No murmur heard. Pulmonary/Chest: Effort normal and breath sounds normal. No respiratory distress.  Abdominal: Soft. She exhibits no distension. There is no tenderness.  Musculoskeletal: She exhibits no edema.  Lymphadenopathy:    She has no cervical adenopathy.  Neurological: She is alert and oriented to person, place, and time.  Skin: Skin is warm and dry.  Psychiatric: She has a normal mood and affect. Her behavior is normal.  Vitals reviewed.         Assessment & Plan:

## 2016-01-23 NOTE — Assessment & Plan Note (Signed)
Improved since stopping the Propranolol.  Pt's energy is better, less confusion.  Continue to hold beta blocker.  BP is well controlled.  No med changes today.  Will continue to follow.

## 2016-02-08 ENCOUNTER — Ambulatory Visit: Payer: Commercial Managed Care - HMO | Admitting: Neurology

## 2016-02-13 ENCOUNTER — Other Ambulatory Visit: Payer: Self-pay | Admitting: Family Medicine

## 2016-02-13 DIAGNOSIS — M1000 Idiopathic gout, unspecified site: Secondary | ICD-10-CM

## 2016-02-15 ENCOUNTER — Ambulatory Visit (INDEPENDENT_AMBULATORY_CARE_PROVIDER_SITE_OTHER): Payer: Commercial Managed Care - HMO | Admitting: General Practice

## 2016-02-15 DIAGNOSIS — I4891 Unspecified atrial fibrillation: Secondary | ICD-10-CM

## 2016-02-15 DIAGNOSIS — Z5181 Encounter for therapeutic drug level monitoring: Secondary | ICD-10-CM

## 2016-02-15 LAB — POCT INR: INR: 2.4

## 2016-02-15 NOTE — Progress Notes (Signed)
I have reviewed and agree with the plan. 

## 2016-02-15 NOTE — Patient Instructions (Signed)
Pre visit review using our clinic review tool, if applicable. No additional management support is needed unless otherwise documented below in the visit note. 

## 2016-03-05 ENCOUNTER — Other Ambulatory Visit: Payer: Self-pay | Admitting: Family Medicine

## 2016-03-12 ENCOUNTER — Encounter: Payer: Self-pay | Admitting: Family Medicine

## 2016-03-12 ENCOUNTER — Ambulatory Visit (INDEPENDENT_AMBULATORY_CARE_PROVIDER_SITE_OTHER): Payer: Medicare HMO | Admitting: Family Medicine

## 2016-03-12 VITALS — BP 152/68 | HR 62 | Temp 97.6°F | Resp 14 | Ht 60.0 in | Wt 182.0 lb

## 2016-03-12 DIAGNOSIS — E785 Hyperlipidemia, unspecified: Secondary | ICD-10-CM

## 2016-03-12 DIAGNOSIS — I10 Essential (primary) hypertension: Secondary | ICD-10-CM | POA: Diagnosis not present

## 2016-03-12 DIAGNOSIS — Z8639 Personal history of other endocrine, nutritional and metabolic disease: Secondary | ICD-10-CM

## 2016-03-12 LAB — CBC WITH DIFFERENTIAL/PLATELET
BASOS PCT: 0.4 % (ref 0.0–3.0)
Basophils Absolute: 0 10*3/uL (ref 0.0–0.1)
EOS PCT: 1 % (ref 0.0–5.0)
Eosinophils Absolute: 0.1 10*3/uL (ref 0.0–0.7)
HCT: 36.5 % (ref 36.0–46.0)
Hemoglobin: 12.1 g/dL (ref 12.0–15.0)
LYMPHS ABS: 0.9 10*3/uL (ref 0.7–4.0)
Lymphocytes Relative: 13.4 % (ref 12.0–46.0)
MCHC: 33 g/dL (ref 30.0–36.0)
MCV: 92.5 fl (ref 78.0–100.0)
MONO ABS: 0.5 10*3/uL (ref 0.1–1.0)
MONOS PCT: 7 % (ref 3.0–12.0)
NEUTROS ABS: 5.3 10*3/uL (ref 1.4–7.7)
NEUTROS PCT: 78.2 % — AB (ref 43.0–77.0)
Platelets: 159 10*3/uL (ref 150.0–400.0)
RBC: 3.94 Mil/uL (ref 3.87–5.11)
RDW: 17.6 % — AB (ref 11.5–15.5)
WBC: 6.7 10*3/uL (ref 4.0–10.5)

## 2016-03-12 LAB — LIPID PANEL
CHOL/HDL RATIO: 4
CHOLESTEROL: 153 mg/dL (ref 0–200)
HDL: 41.5 mg/dL (ref >39.00)
LDL Cholesterol: 92 mg/dL (ref 0–99)
NONHDL: 111.18
TRIGLYCERIDES: 94 mg/dL (ref 0.0–149.0)
VLDL: 18.8 mg/dL (ref 0.0–40.0)

## 2016-03-12 LAB — BASIC METABOLIC PANEL
BUN: 37 mg/dL — ABNORMAL HIGH (ref 6–23)
CALCIUM: 9.4 mg/dL (ref 8.4–10.5)
CO2: 30 meq/L (ref 19–32)
Chloride: 105 mEq/L (ref 96–112)
Creatinine, Ser: 1.45 mg/dL — ABNORMAL HIGH (ref 0.40–1.20)
GFR: 36.49 mL/min — ABNORMAL LOW (ref >60.00)
Glucose, Bld: 157 mg/dL — ABNORMAL HIGH (ref 70–99)
Potassium: 3.9 mEq/L (ref 3.5–5.1)
SODIUM: 144 meq/L (ref 135–145)

## 2016-03-12 LAB — HEMOGLOBIN A1C: Hgb A1c MFr Bld: 5.8 % (ref 4.6–6.5)

## 2016-03-12 LAB — HEPATIC FUNCTION PANEL
ALK PHOS: 73 U/L (ref 39–117)
ALT: 15 U/L (ref 0–35)
AST: 20 U/L (ref 0–37)
Albumin: 4 g/dL (ref 3.5–5.2)
BILIRUBIN DIRECT: 0.2 mg/dL (ref 0.0–0.3)
BILIRUBIN TOTAL: 0.7 mg/dL (ref 0.2–1.2)
Total Protein: 6.5 g/dL (ref 6.0–8.3)

## 2016-03-12 LAB — TSH: TSH: 3.34 u[IU]/mL (ref 0.35–4.50)

## 2016-03-12 MED ORDER — HYDRALAZINE HCL 100 MG PO TABS: 100.0000 mg | ORAL_TABLET | Freq: Two times a day (BID) | ORAL | 3 refills | Status: DC

## 2016-03-12 NOTE — Progress Notes (Signed)
   Subjective:    Patient ID: [name], female    DOB: Oct 17, 1929, 83 y.o.   MRN: [id number]  HPI HTN- chronic problem, BP is much higher than previous since beta blocker was stopped.  Remains on Lasix, Hydralazine, Amlodipine.  Home BPs ranging 140-196/64-104.  No CP, SOB above baseline, HAs, edema.  Hyperlipidemia- chronic problem, on Simvastatin daily.  Denies abd pain, N/V, myalgias.  Hx of diet controlled diabetes- due for repeat labs today.  Asymptomatic.  Not exercising or following a particular diet.   Review of Systems For ROS see HPI     Objective:   Physical Exam  Constitutional: She is oriented to person, place, and time. She appears well-developed and well-nourished. No distress.  HENT:  Head: Normocephalic and atraumatic.  Eyes: Conjunctivae and EOM are normal. Pupils are equal, round, and reactive to light.  Neck: Normal range of motion. Neck supple. No thyromegaly present.  Cardiovascular: Normal rate, regular rhythm, normal heart sounds and intact distal pulses.   No murmur heard. Pulmonary/Chest: Effort normal and breath sounds normal. No respiratory distress.  Abdominal: Soft. She exhibits no distension. There is no tenderness.  Musculoskeletal: She exhibits no edema.  Lymphadenopathy:    She has no cervical adenopathy.  Neurological: She is alert and oriented to person, place, and time.  Skin: Skin is warm and dry.  Psychiatric: She has a normal mood and affect. Her behavior is normal.  Vitals reviewed.         Assessment & Plan:

## 2016-03-12 NOTE — Assessment & Plan Note (Signed)
Chronic problem.  Tolerating statin w/o difficulty.  Check labs.  Adjust meds prn  

## 2016-03-12 NOTE — Assessment & Plan Note (Signed)
Deteriorated.  Pt's BP has been elevated since stopping beta blocker.  Pulse has rebounded nicely and daughter reports confusion is better.  Increase hydralazine to 100mg  BID.  May need to switch to TID dosing but pt admits this will be hard for her to do.  Will follow closely.

## 2016-03-12 NOTE — Progress Notes (Signed)
Pre visit review using our clinic review tool, if applicable. No additional management support is needed unless otherwise documented below in the visit note. 

## 2016-03-12 NOTE — Patient Instructions (Addendum)
Follow up in 1 month to recheck BP We'll notify you of your lab results and make any changes if needed Increase the Hydralazine to 100mg  twice daily Continue the Amlodipine and Lasix at the current doses Call with any questions or concerns Happy New Year!!!

## 2016-03-12 NOTE — Assessment & Plan Note (Signed)
Pt continues to hover in the pre-diabetes range.  Check labs.  Again stressed need for low carb diet w/ daughter.  Will follow.

## 2016-03-26 DIAGNOSIS — I10 Essential (primary) hypertension: Secondary | ICD-10-CM | POA: Diagnosis not present

## 2016-03-26 DIAGNOSIS — N183 Chronic kidney disease, stage 3 (moderate): Secondary | ICD-10-CM | POA: Diagnosis not present

## 2016-03-28 ENCOUNTER — Ambulatory Visit: Payer: Commercial Managed Care - HMO

## 2016-04-07 ENCOUNTER — Other Ambulatory Visit: Payer: Self-pay | Admitting: Family Medicine

## 2016-04-17 ENCOUNTER — Ambulatory Visit (INDEPENDENT_AMBULATORY_CARE_PROVIDER_SITE_OTHER): Payer: Medicare HMO | Admitting: Diagnostic Neuroimaging

## 2016-04-17 ENCOUNTER — Encounter: Payer: Self-pay | Admitting: Diagnostic Neuroimaging

## 2016-04-17 VITALS — BP 150/51 | HR 56 | Wt 182.8 lb

## 2016-04-17 DIAGNOSIS — F0390 Unspecified dementia without behavioral disturbance: Secondary | ICD-10-CM

## 2016-04-17 DIAGNOSIS — F03B0 Unspecified dementia, moderate, without behavioral disturbance, psychotic disturbance, mood disturbance, and anxiety: Secondary | ICD-10-CM

## 2016-04-17 NOTE — Progress Notes (Signed)
[facility]  PATIENT: [name] DOB: Jan 03, 1930  REFERRING CLINICIAN: Cordelia Pen HISTORY FROM: patient and daughter  REASON FOR VISIT: follow up   HISTORICAL  CHIEF COMPLAINT:  Chief Complaint  Patient presents with  . Dementia    rm 6, dgtr- [name], MMSE 17  . Follow-up    4 month    HISTORY OF PRESENT ILLNESS:   UPDATE 04/17/16: Since last visit doing about the same. No new issues. More fatigue and confusion at times, but overall stable.   PRIOR HPI (12/12/15): 83 year old right-handed female with congestive heart failure, chronic kidney disease, asthma, hypertension, atrial fibrillation here for evaluation of memory loss problems. Patient denies any memory problems. She does recognize that her daughter has noticed some memory problems. According to daughter patient has been having progressive decline over the past 10 years, initially mainly on the physical basis. Patient's daughter was helping her with house chores over the past 10 years. In the past 3 years patient's daughter has been helping prepare and fix meals for her. 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 Patient had to retire in October 2016 due to difficulty in keeping up with her job on the physical basis. At this point patient was still living independently. In January 2017 patient was sick and in the hospital, and upon discharge patient moved in with her daughter. Since that time patient has been having more problems with following directions, having short-term memory problems, being more withdrawn, having more depression.   REVIEW OF SYSTEMS: Full 14 system review of systems performed and negative with exception of: memory loss incontinence confusion.    ALLERGIES: No Known Allergies  HOME MEDICATIONS: Outpatient Medications Prior to Visit  Medication Sig Dispense Refill  .  albuterol (VENTOLIN HFA) 108 (90 Base) MCG/ACT inhaler INHALE TWO PUFFS EVERY 4 HOURS AS NEEDED FOR COUGH AND  WHEEZING 18 g 2  . allopurinol (ZYLOPRIM) 100 MG tablet TAKE ONE TABLET BY MOUTH ONCE DAILY 30 tablet 6  . amLODipine (NORVASC) 5 MG tablet Take 10 mg by mouth at bedtime.     . cetirizine (ZYRTEC) 10 MG tablet Take 10 mg by mouth daily.    . colchicine 0.6 MG tablet 2 tabs x1 dose and then 1 tab 1 hr later.  Repeat in 3 days if needed. 9 tablet 0  . fluticasone (FLONASE) 50 MCG/ACT nasal spray Place 2 sprays into both nostrils daily. 16 g 6  . furosemide (LASIX) 40 MG tablet Take 1 tablet (40 mg total) by mouth daily. 30 tablet 3  . hydrALAZINE (APRESOLINE) 100 MG tablet Take 1 tablet (100 mg total) by mouth 2 (two) times daily. 60 tablet 3  . levothyroxine (SYNTHROID, LEVOTHROID) 50 MCG tablet TAKE ONE TABLET BY MOUTH ONCE DAILY 30 tablet 6  . simvastatin (ZOCOR) 40 MG tablet TAKE ONE-HALF TABLET BY MOUTH AT BEDTIME 30 tablet 6  . warfarin (COUMADIN) 3 MG tablet TAKE TWO TABLETS BY MOUTH ONCE DAILY 60 tablet 6   No facility-administered medications prior to visit.     PAST MEDICAL HISTORY: Past Medical History:  Diagnosis Date  . Atrial fibrillation (HCC)   . CHF (congestive heart failure) (HCC)   . CKD (chronic kidney disease)    stage 3  . DIABETES-TYPE 2    81/16/17 dgtr unsure  . DYSPNEA ON EXERTION   .  Edema   . GERD   . HYPERTENSION   . Intrinsic asthma, unspecified   . KNEE PAIN   . OBESITY   . Other and unspecified hyperlipidemia     PAST SURGICAL HISTORY: Past Surgical History:  Procedure Laterality Date  . ABDOMINAL HYSTERECTOMY    . I&D EXTREMITY Right 03/13/2015   Procedure: IRRIGATION AND DEBRIDEMENT RIGHT WRIST;  Surgeon: Knute Neu, MD;  Location: [facility];  Service: Plastics;  Laterality: Right;  . KNEE SURGERY Left    arthroscopic    FAMILY HISTORY: Family History  Problem Relation Age of Onset  . Coronary artery disease Mother   .  Hypertension Mother   . Stroke Mother   . Heart attack Mother   . Hypertension Father   . Prostate cancer Father   . Stroke Father   . Heart attack Father   . Cancer Brother     throat    SOCIAL HISTORY:  Social History   Social History  . Marital status: Divorced    Spouse name: N/A  . Number of children: 3  . Years of education: 65   Occupational History  .      retired   Social History Main Topics  . Smoking status: Never Smoker  . Smokeless tobacco: Never Used  . Alcohol use No  . Drug use: No  . Sexual activity: No   Other Topics Concern  . Not on file   Social History Narrative   04/17/16 Lives with daughter   No caffeine use     PHYSICAL EXAM  GENERAL EXAM/CONSTITUTIONAL: Vitals:  Vitals:   04/17/16 1314  BP: (!) 150/51  Pulse: (!) 56  Weight: 182 lb 12.8 oz (82.9 kg)   Body mass index is 35.7 kg/m. No exam data present  Patient is in no distress; well developed, nourished and groomed; neck is supple  CALM AND PLEASANT  CARDIOVASCULAR:  Examination of carotid arteries is normal; no carotid bruits  Regular rate and rhythm, no murmurs  Examination of peripheral vascular system by observation and palpation is normal  EYES:  Ophthalmoscopic exam of optic discs and posterior segments is normal; no papilledema or hemorrhages  MUSCULOSKELETAL:  Gait, strength, tone, movements noted in Neurologic exam below  NEUROLOGIC: MENTAL STATUS:  MMSE - Mini Mental State Exam 04/17/2016 12/12/2015  Orientation to time 0 0  Orientation to Place 5 4  Registration 3 3  Attention/ Calculation 0 2  Recall 1 0  Language- name 2 objects 2 2  Language- repeat 0 0  Language- follow 3 step command 3 3  Language- read & follow direction 1 1  Write a sentence 1 1  Copy design 1 1  Total score 17 17    awake, alert, oriented to person, place; NOT YEAR ("1918")  DECR MEMORY   DECR attention and concentration  DECR FLUENCY; comprehension intact,  naming intact,   fund of knowledge appropriate  ANIMAL FLUENCY TEST = 6  CRANIAL NERVE:   2nd - no papilledema on fundoscopic exam  2nd, 3rd, 4th, 6th - pupils equal and reactive to light, visual fields full to confrontation, extraocular muscles intact, no nystagmus  5th - facial sensation symmetric  7th - facial strength symmetric  8th - hearing intact  9th - palate elevates symmetrically, uvula midline  11th - shoulder shrug symmetric  12th - tongue protrusion midline  MOTOR:   normal bulk and tone, full strength in the BUE, BLE  SENSORY:   normal and symmetric  to light touch, temperature, vibration  COORDINATION:   finger-nose-finger, fine finger movements, foot tapping --> normal  REFLEXES:   deep tendon reflexes TRACE and symmetric  GAIT/STATION:   narrow based gait; USES WALKER    DIAGNOSTIC DATA (LABS, IMAGING, TESTING) - I reviewed patient records, labs, notes, testing and imaging myself where available.  Lab Results  Component Value Date   WBC 6.7 03/12/2016   HGB 12.1 03/12/2016   HCT 36.5 03/12/2016   MCV 92.5 03/12/2016   PLT 159.0 03/12/2016      Component Value Date/Time   NA 144 03/12/2016 1357   K 3.9 03/12/2016 1357   CL 105 03/12/2016 1357   CO2 30 03/12/2016 1357   GLUCOSE 157 (H) 03/12/2016 1357   GLUCOSE 126 07/02/2006   BUN 37 (H) 03/12/2016 1357   CREATININE 1.45 (H) 03/12/2016 1357   CREATININE 1.41 (H) 12/29/2010 1645   CALCIUM 9.4 03/12/2016 1357   PROT 6.5 03/12/2016 1357   ALBUMIN 4.0 03/12/2016 1357   AST 20 03/12/2016 1357   ALT 15 03/12/2016 1357   ALKPHOS 73 03/12/2016 1357   BILITOT 0.7 03/12/2016 1357   GFRNONAA 38 (L) 04/10/2015 0055   GFRAA 44 (L) 04/10/2015 0055   Lab Results  Component Value Date   CHOL 153 03/12/2016   HDL 41.50 03/12/2016   LDLCALC 92 03/12/2016   LDLDIRECT 168.9 12/07/2008   TRIG 94.0 03/12/2016   CHOLHDL 4 03/12/2016   Lab Results  Component Value Date   HGBA1C 5.8  03/12/2016   No results found for: [id number] Lab Results  Component Value Date   TSH 3.34 03/12/2016    12/23/15 MRI brain 1.    Moderate generalized cortical atrophy that is most pronounced in the mesial temporal lobes. 2.    Advanced chronic microvascular ischemic changes within the hemispheres and milder chronic ischemic changes within the pons.  3.    Chronic microhemorrhage within the right cerebellar hemisphere. 4.    There are no acute findings.    ASSESSMENT AND PLAN  83 y.o. year old female here with Progressive physical and cognitive decline over several years, particularly in 2017. Most likely represents underlying, incipient neurodegenerative dementia. MMSE 17/30. Patient also has other comorbid medical conditions.    Dx:  1. Moderate dementia without behavioral disturbance      PLAN: I spent 15 minutes of face to face time with patient. Greater than 50% of time was spent in counseling and coordination of care with patient. In summary we discussed:  - reviewed options for safety and supervision and community resources with patient's daughter - reviewed options for medication (donepezil, memantine) but will hold off for now (age, medical conditions, polypharmacy) - continue safety and supervision (per daughter) - consider adult center for enrichment or other day program - consider home health agency referral / home aid - discussed advanced care planning; may benefit for from palliative care   Return if symptoms worsen or fail to improve, for return to PCP.    Suanne MarkerVIKRAM R. Demitris Pokorny, MD 04/17/2016, 1:37 PM Certified in Neurology, Neurophysiology and Neuroimaging  [facility] [address], [address] [place], [place] [address] [phone]

## 2016-06-07 ENCOUNTER — Telehealth: Payer: Self-pay | Admitting: Family Medicine

## 2016-06-07 NOTE — Telephone Encounter (Signed)
FYI

## 2016-06-07 NOTE — Telephone Encounter (Signed)
Patient Name: [name]  DOB: 08-06-1929    Initial Comment Caller states that she is calling about her mother having congestion, coughing, wheezing, and she has shortness of breath.    Nurse Assessment  Nurse: Stefano Gaul, RN, Dwana Curd Date/Time (Eastern Time): 06/07/2016 12:05:44 PM  Confirm and document reason for call. If symptomatic, describe symptoms. ---Caller states mother has cough with congestion. Has heard some wheezing. No fever. Cough started last Friday. She has some SOB normally but breathing seems more labored.  Does the patient have any new or worsening symptoms? ---Yes  Will a triage be completed? ---Yes  Related visit to physician within the last 2 weeks? ---No  Does the PT have any chronic conditions? (i.e. diabetes, asthma, etc.) ---Yes  List chronic conditions. ---asthma; Alzheimers  Is this a behavioral health or substance abuse call? ---No     Guidelines    Guideline Title Affirmed Question Affirmed Notes  Cough - Acute Productive Difficulty breathing    Final Disposition User   Go to ED Now Stefano Gaul, RN, Dwana Curd    Referrals  GO TO FACILITY OTHER - SPECIFY   Disagree/Comply: Comply

## 2016-07-19 ENCOUNTER — Other Ambulatory Visit: Payer: Self-pay | Admitting: Family Medicine

## 2016-07-19 DIAGNOSIS — N183 Chronic kidney disease, stage 3 (moderate): Secondary | ICD-10-CM | POA: Diagnosis not present

## 2016-07-19 DIAGNOSIS — I10 Essential (primary) hypertension: Secondary | ICD-10-CM | POA: Diagnosis not present

## 2016-09-10 ENCOUNTER — Other Ambulatory Visit: Payer: Self-pay | Admitting: Family Medicine

## 2016-09-10 DIAGNOSIS — M1000 Idiopathic gout, unspecified site: Secondary | ICD-10-CM

## 2016-10-12 ENCOUNTER — Ambulatory Visit (INDEPENDENT_AMBULATORY_CARE_PROVIDER_SITE_OTHER): Payer: Medicare HMO | Admitting: General Practice

## 2016-10-12 DIAGNOSIS — Z5181 Encounter for therapeutic drug level monitoring: Secondary | ICD-10-CM | POA: Diagnosis not present

## 2016-10-12 DIAGNOSIS — I4891 Unspecified atrial fibrillation: Secondary | ICD-10-CM | POA: Diagnosis not present

## 2016-10-12 LAB — POCT INR: INR: 2.5

## 2016-10-12 NOTE — Patient Instructions (Signed)
Pre visit review using our clinic review tool, if applicable. No additional management support is needed unless otherwise documented below in the visit note. 

## 2016-10-18 DIAGNOSIS — I10 Essential (primary) hypertension: Secondary | ICD-10-CM | POA: Diagnosis not present

## 2016-10-18 DIAGNOSIS — N183 Chronic kidney disease, stage 3 (moderate): Secondary | ICD-10-CM | POA: Diagnosis not present

## 2016-10-18 DIAGNOSIS — Z6835 Body mass index (BMI) 35.0-35.9, adult: Secondary | ICD-10-CM | POA: Diagnosis not present

## 2016-10-25 ENCOUNTER — Ambulatory Visit (INDEPENDENT_AMBULATORY_CARE_PROVIDER_SITE_OTHER): Payer: Medicare HMO | Admitting: Family Medicine

## 2016-10-25 ENCOUNTER — Encounter: Payer: Self-pay | Admitting: Family Medicine

## 2016-10-25 ENCOUNTER — Ambulatory Visit: Payer: Medicare HMO

## 2016-10-25 VITALS — BP 134/86 | HR 56 | Temp 98.0°F | Resp 18 | Ht 60.0 in | Wt 171.8 lb

## 2016-10-25 DIAGNOSIS — Z0000 Encounter for general adult medical examination without abnormal findings: Secondary | ICD-10-CM

## 2016-10-25 DIAGNOSIS — I5032 Chronic diastolic (congestive) heart failure: Secondary | ICD-10-CM

## 2016-10-25 DIAGNOSIS — I10 Essential (primary) hypertension: Secondary | ICD-10-CM | POA: Diagnosis not present

## 2016-10-25 DIAGNOSIS — L602 Onychogryphosis: Secondary | ICD-10-CM | POA: Diagnosis not present

## 2016-10-25 DIAGNOSIS — I4891 Unspecified atrial fibrillation: Secondary | ICD-10-CM | POA: Diagnosis not present

## 2016-10-25 DIAGNOSIS — E785 Hyperlipidemia, unspecified: Secondary | ICD-10-CM | POA: Diagnosis not present

## 2016-10-25 LAB — CBC WITH DIFFERENTIAL/PLATELET
BASOS ABS: 0 {cells}/uL (ref 0–200)
Basophils Relative: 0 %
EOS ABS: 0 {cells}/uL — AB (ref 15–500)
EOS PCT: 0 %
HCT: 36.3 % (ref 35.0–45.0)
HEMOGLOBIN: 11.6 g/dL — AB (ref 11.7–15.5)
LYMPHS ABS: 804 {cells}/uL — AB (ref 850–3900)
Lymphocytes Relative: 12 %
MCH: 30.6 pg (ref 27.0–33.0)
MCHC: 32 g/dL (ref 32.0–36.0)
MCV: 95.8 fL (ref 80.0–100.0)
MONO ABS: 737 {cells}/uL (ref 200–950)
MPV: 10 fL (ref 7.5–12.5)
Monocytes Relative: 11 %
NEUTROS ABS: 5159 {cells}/uL (ref 1500–7800)
Neutrophils Relative %: 77 %
PLATELETS: 176 10*3/uL (ref 140–400)
RBC: 3.79 MIL/uL — ABNORMAL LOW (ref 3.80–5.10)
RDW: 15.9 % — AB (ref 11.0–15.0)
WBC: 6.7 10*3/uL (ref 3.8–10.8)

## 2016-10-25 NOTE — Progress Notes (Signed)
   Subjective:    Patient ID: [name], female    DOB: 1929/04/06, 83 y.o.   MRN: [id number]  HPI CPE- pt declines all immunizations, mammo, DEXA.  No need for colonoscopy.  Pt has lost 12 lbs since last visit.       Review of Systems Patient reports no vision/ hearing changes, adenopathy,fever, weight change,  persistant/recurrent hoarseness , swallowing issues, chest pain, palpitations, edema, persistant/recurrent cough, hemoptysis, gastrointestinal bleeding (melena, rectal bleeding), abdominal pain, significant heartburn, bowel changes, GU symptoms (dysuria, hematuria, incontinence), Gyn symptoms (abnormal  bleeding, pain),  syncope, focal weakness, numbness & tingling, skin/hair/nail changes, abnormal bruising or bleeding, anxiety, or depression.   + SOB w/ exertion + hemorrhoids- pt refusing exam today Thickened toenails    Objective:   Physical Exam  Constitutional: She appears well-developed and well-nourished. No distress.  HENT:  Head: Normocephalic and atraumatic.  Eyes: Pupils are equal, round, and reactive to light. Conjunctivae and EOM are normal.  Neck: Normal range of motion. Neck supple. No thyromegaly present.  Cardiovascular: Normal rate, regular rhythm, normal heart sounds and intact distal pulses.   No murmur heard. Pulmonary/Chest: Effort normal and breath sounds normal. No respiratory distress.  Abdominal: Soft. She exhibits no distension. There is no tenderness.  Musculoskeletal: She exhibits no edema.  Lymphadenopathy:    She has no cervical adenopathy.  Neurological: She is alert.  Pt easily confused today, oriented to person and place only  Skin: Skin is warm and dry.  Psychiatric: She has a normal mood and affect. Her behavior is normal.  Vitals reviewed.         Assessment & Plan:

## 2016-10-25 NOTE — Assessment & Plan Note (Signed)
Chronic problem.  Tolerating statin w/o difficulty.  Check labs.  Adjust meds prn  

## 2016-10-25 NOTE — Assessment & Plan Note (Signed)
Pt's PE unchanged w/ exception of 12 lb weight loss.  She declines mammo, DEXA.  Declines all vaccines.  Check labs.  Anticipatory guidance provided.

## 2016-10-25 NOTE — Assessment & Plan Note (Signed)
Chronic problem, adequate control today after resting in the office.  Asymptomatic.  Check labs.  No anticipated med changes.  Will follow.

## 2016-10-25 NOTE — Assessment & Plan Note (Signed)
Chronic problem.  Asymptomatic.  Following w/ cardiology.

## 2016-10-25 NOTE — Assessment & Plan Note (Signed)
Chronic problem.  Following w/ Dr Ladona Ridgelaylor.  Asymptomatic.  On Coumadin per Cards.  Will follow along.

## 2016-10-25 NOTE — Progress Notes (Signed)
Subjective:   [name] is a 83 y.o. female who presents for Medicare Annual (Subsequent) preventive examination.  Review of Systems:  No ROS.  Medicare Wellness Visit. Additional risk factors are reflected in the social history.  Cardiac Risk Factors include: advanced age (>1655men, 80>65 women)   Sleep patterns: Sleeps over 10 hours. Feels rested.  Home Safety/Smoke Alarms: Feels safe in home. Smoke alarms in place.  Living environment; residence and Firearm Safety: Lives with daughter and SIL in 1 story home.  Seat Belt Safety/Bike Helmet: Wears seat belt.   Female:   Pap-2002       Mammo-07/17/2013, Benign.       Dexa scan-07/17/2013, Low Bone Mass        CCS-N/A      Objective:     Vitals: BP (!) 152/70 (BP Location: Left Arm, Patient Position: Sitting, Cuff Size: Normal)   Pulse (!) 56   Temp 98 F (36.7 C) (Temporal)   Resp 18   Ht 5' (1.524 m)   Wt 171 lb 12.8 oz (77.9 kg)   SpO2 98%   BMI 33.55 kg/m   Body mass index is 33.55 kg/m.   Tobacco History  Smoking Status  . Never Smoker  Smokeless Tobacco  . Never Used     Counseling given: Not Answered   Past Medical History:  Diagnosis Date  . Atrial fibrillation (HCC)   . CHF (congestive heart failure) (HCC)   . CKD (chronic kidney disease)    stage 3  . DIABETES-TYPE 2    12/12/15 dgtr unsure  . DYSPNEA ON EXERTION   . Edema   . GERD   . HYPERTENSION   . Intrinsic asthma, unspecified   . KNEE PAIN   . OBESITY   . Other and unspecified hyperlipidemia    Past Surgical History:  Procedure Laterality Date  . ABDOMINAL HYSTERECTOMY    . I&D EXTREMITY Right 03/13/2015   Procedure: IRRIGATION AND DEBRIDEMENT RIGHT WRIST;  Surgeon: Knute NeuHarrill Coley, MD;  Location: [facility];  Service: Plastics;  Laterality: Right;  . KNEE SURGERY Left    arthroscopic   Family History  Problem Relation Age of Onset  . Coronary artery disease Mother   . Hypertension Mother   . Stroke Mother   . Heart attack  Mother   . Hypertension Father   . Prostate cancer Father   . Stroke Father   . Heart attack Father   . Cancer Brother        throat   History  Sexual Activity  . Sexual activity: No    Outpatient Encounter Prescriptions as of 10/25/2016  Medication Sig  . albuterol (VENTOLIN HFA) 108 (90 Base) MCG/ACT inhaler INHALE TWO PUFFS EVERY 4 HOURS AS NEEDED FOR COUGH AND  WHEEZING  . allopurinol (ZYLOPRIM) 100 MG tablet TAKE ONE TABLET BY MOUTH ONCE DAILY  . amLODipine (NORVASC) 5 MG tablet Take 10 mg by mouth at bedtime.   . cetirizine (ZYRTEC) 10 MG tablet Take 10 mg by mouth daily.  . colchicine 0.6 MG tablet 2 tabs x1 dose and then 1 tab 1 hr later.  Repeat in 3 days if needed.  . fluticasone (FLONASE) 50 MCG/ACT nasal spray Place 2 sprays into both nostrils daily.  . furosemide (LASIX) 40 MG tablet Take 1 tablet (40 mg total) by mouth daily.  . hydrALAZINE (APRESOLINE) 100 MG tablet TAKE ONE TABLET BY MOUTH TWICE DAILY  . levothyroxine (SYNTHROID, LEVOTHROID) 50 MCG tablet TAKE ONE TABLET  BY MOUTH ONCE DAILY  . Multiple Vitamins-Minerals (MULTIVITAMIN ADULTS 50+ PO) Take by mouth.  . simvastatin (ZOCOR) 40 MG tablet TAKE ONE-HALF TABLET BY MOUTH AT BEDTIME  . warfarin (COUMADIN) 3 MG tablet TAKE TWO TABLETS BY MOUTH ONCE DAILY   No facility-administered encounter medications on file as of 10/25/2016.     Activities of Daily Living In your present state of health, do you have any difficulty performing the following activities: 10/25/2016  Hearing? N  Vision? N  Difficulty concentrating or making decisions? Y  Walking or climbing stairs? Y  Dressing or bathing? Y  Doing errands, shopping? Y  Preparing Food and eating ? N  Using the Toilet? N  In the past six months, have you accidently leaked urine? Y  Do you have problems with loss of bowel control? Y  Managing your Medications? Y  Managing your Finances? Y  Housekeeping or managing your Housekeeping? Y  Some recent data might  be hidden    Patient Care Team: Sheliah Hatch, MD as PCP - Jackey Loge, MD as Consulting Physician (Nephrology) Marinus Maw, MD as Consulting Physician (Cardiology)    Assessment:    Physical assessment deferred to PCP.  Exercise Activities and Dietary recommendations Current Exercise Habits: The patient does not participate in regular exercise at present, Exercise limited by: orthopedic condition(s)   Diet (meal preparation, eat out, water intake, caffeinated beverages, dairy products, fruits and vegetables): Drinks water and orange crush.  Breakfast: banana, typically does not eat.  Lunch: protein and vegetables; sandwich Dinner: protein and vegetables.  Snacks on ice cream sandwich.   Patient typically sleeps a lot throughout the day, manages to eat at least 2 meals.  Goals      Patient Stated   . patient states (pt-stated)          Attend adult day care occasionally.       Fall Risk Fall Risk  10/25/2016 04/17/2016 12/12/2015 09/12/2015 05/24/2015  Falls in the past year? No No Yes No Yes  Number falls in past yr: - - 2 or more - 2 or more  Injury with Fall? - - No - No  Risk for fall due to : - - Impaired balance/gait - -  Follow up - - - - -   Depression Screen PHQ 2/9 Scores 10/25/2016 09/12/2015 04/25/2015 04/12/2015  PHQ - 2 Score 0 0 0 0  Exception Documentation - - - Patient refusal     Cognitive Function MMSE - Mini Mental State Exam 10/25/2016 04/17/2016 12/12/2015  Orientation to time 0 0 0  Orientation to Place 2 5 4   Registration 3 3 3   Attention/ Calculation 2 0 2  Recall 0 1 0  Language- name 2 objects 2 2 2   Language- repeat 1 0 0  Language- follow 3 step command 3 3 3   Language- read & follow direction 1 1 1   Write a sentence 1 1 1   Copy design 1 1 1   Total score 16 17 17         There is no immunization history for the selected administration types on file for this patient. Screening Tests Health Maintenance  Topic  Date Due  . FOOT EXAM  12/19/2014  . URINE MICROALBUMIN  09/09/2015  . HEMOGLOBIN A1C  09/09/2016  . OPHTHALMOLOGY EXAM  11/26/2016 (Originally 05/22/1939)  . INFLUENZA VACCINE  09/26/2017 (Originally 09/26/2016)  . MAMMOGRAM  11/26/2017 (Originally 07/18/2015)  . PNA vac Low Risk Adult (1 of 2 -  PCV13) 11/26/2017 (Originally 05/22/1996)  . TETANUS/TDAP  04/10/2021  . DEXA SCAN  Completed   Declines vaccines and screenings.     Plan:    Begin doing brain stimulating activities (puzzles, reading, adult coloring books, staying active) to keep memory sharp.   I have personally reviewed and noted the following in the patient's chart:   . Medical and social history . Use of alcohol, tobacco or illicit drugs  . Current medications and supplements . Functional ability and status . Nutritional status . Physical activity . Advanced directives . List of other physicians . Hospitalizations, surgeries, and ER visits in previous 12 months . Vitals . Screenings to include cognitive, depression, and falls . Referrals and appointments  In addition, I have reviewed and discussed with patient certain preventive protocols, quality metrics, and best practice recommendations. A written personalized care plan for preventive services as well as general preventive health recommendations were provided to patient.     Alysia Penna, RN  10/25/2016  Documentation reviewed and agree w/ above.  Neena Rhymes, MD

## 2016-10-25 NOTE — Patient Instructions (Addendum)
Follow up in 6 months to recheck BP and cholesterol We'll notify you of your lab results and make any changes if needed Continue to eat healthy food choices Please consider the flu and pneumonia shots We'll call you with your podiatry appt for nail care Use OTC Preparation H w/ hydrocortisone to shrink the hemorrhoid.  If no improvement, let us know Call with any questions or concerns Hang in there!!!  Begin doing brain stimulating activities (puzzles, reading, adult coloring books, staying active) to keep memory sharp.   Health Maintenance, Female Adopting a healthy lifestyle and getting preventive care can go a long way to promote health and wellness. Talk with your health care provider about what schedule of regular examinations is right for you. This is a good chance for you to check in with your provider about disease prevention and staying healthy. In between checkups, there are plenty of things you can do on your own. Experts have done a lot of research about which lifestyle changes and preventive measures are most likely to keep you healthy. Ask your health care provider for more information. Weight and diet Eat a healthy diet  Be sure to include plenty of vegetables, fruits, low-fat dairy products, and lean protein.  Do not eat a lot of foods high in solid fats, added sugars, or salt.  Get regular exercise. This is one of the most important things you can do for your health. ? Most adults should exercise for at least 150 minutes each week. The exercise should increase your heart rate and make you sweat (moderate-intensity exercise). ? Most adults should also do strengthening exercises at least twice a week. This is in addition to the moderate-intensity exercise.  Maintain a healthy weight  Body mass index (BMI) is a measurement that can be used to identify possible weight problems. It estimates body fat based on height and weight. Your health care provider can help determine your  BMI and help you achieve or maintain a healthy weight.  For females 53 years of age and older: ? A BMI below 18.5 is considered underweight. ? A BMI of 18.5 to 24.9 is normal. ? A BMI of 25 to 29.9 is considered overweight. ? A BMI of 30 and above is considered obese.  Watch levels of cholesterol and blood lipids  You should start having your blood tested for lipids and cholesterol at 81 years of age, then have this test every 5 years.  You may need to have your cholesterol levels checked more often if: ? Your lipid or cholesterol levels are high. ? You are older than 81 years of age. ? You are at high risk for heart disease.  Cancer screening Lung Cancer  Lung cancer screening is recommended for adults 57-33 years old who are at high risk for lung cancer because of a history of smoking.  A yearly low-dose CT scan of the lungs is recommended for people who: ? Currently smoke. ? Have quit within the past 15 years. ? Have at least a 30-pack-year history of smoking. A pack year is smoking an average of one pack of cigarettes a day for 1 year.  Yearly screening should continue until it has been 15 years since you quit.  Yearly screening should stop if you develop a health problem that would prevent you from having lung cancer treatment.  Breast Cancer  Practice breast self-awareness. This means understanding how your breasts normally appear and feel.  It also means doing regular breast self-exams. Let  your health care provider know about any changes, no matter how small.  If you are in your 20s or 30s, you should have a clinical breast exam (CBE) by a health care provider every 1-3 years as part of a regular health exam.  If you are 45 or older, have a CBE every year. Also consider having a breast X-ray (mammogram) every year.  If you have a family history of breast cancer, talk to your health care provider about genetic screening.  If you are at high risk for breast cancer,  talk to your health care provider about having an MRI and a mammogram every year.  Breast cancer gene (BRCA) assessment is recommended for women who have family members with BRCA-related cancers. BRCA-related cancers include: ? Breast. ? Ovarian. ? Tubal. ? Peritoneal cancers.  Results of the assessment will determine the need for genetic counseling and BRCA1 and BRCA2 testing.  Cervical Cancer Your health care provider may recommend that you be screened regularly for cancer of the pelvic organs (ovaries, uterus, and vagina). This screening involves a pelvic examination, including checking for microscopic changes to the surface of your cervix (Pap test). You may be encouraged to have this screening done every 3 years, beginning at age 2.  For women ages 64-65, health care providers may recommend pelvic exams and Pap testing every 3 years, or they may recommend the Pap and pelvic exam, combined with testing for human papilloma virus (HPV), every 5 years. Some types of HPV increase your risk of cervical cancer. Testing for HPV may also be done on women of any age with unclear Pap test results.  Other health care providers may not recommend any screening for nonpregnant women who are considered low risk for pelvic cancer and who do not have symptoms. Ask your health care provider if a screening pelvic exam is right for you.  If you have had past treatment for cervical cancer or a condition that could lead to cancer, you need Pap tests and screening for cancer for at least 20 years after your treatment. If Pap tests have been discontinued, your risk factors (such as having a new sexual partner) need to be reassessed to determine if screening should resume. Some women have medical problems that increase the chance of getting cervical cancer. In these cases, your health care provider may recommend more frequent screening and Pap tests.  Colorectal Cancer  This type of cancer can be detected and often  prevented.  Routine colorectal cancer screening usually begins at 81 years of age and continues through 81 years of age.  Your health care provider may recommend screening at an earlier age if you have risk factors for colon cancer.  Your health care provider may also recommend using home test kits to check for hidden blood in the stool.  A small camera at the end of a tube can be used to examine your colon directly (sigmoidoscopy or colonoscopy). This is done to check for the earliest forms of colorectal cancer.  Routine screening usually begins at age 58.  Direct examination of the colon should be repeated every 5-10 years through 81 years of age. However, you may need to be screened more often if early forms of precancerous polyps or small growths are found.  Skin Cancer  Check your skin from head to toe regularly.  Tell your health care provider about any new moles or changes in moles, especially if there is a change in a mole's shape or color.  Also tell your health care provider if you have a mole that is larger than the size of a pencil eraser.  Always use sunscreen. Apply sunscreen liberally and repeatedly throughout the day.  Protect yourself by wearing long sleeves, pants, a wide-brimmed hat, and sunglasses whenever you are outside.  Heart disease, diabetes, and high blood pressure  High blood pressure causes heart disease and increases the risk of stroke. High blood pressure is more likely to develop in: ? People who have blood pressure in the high end of the normal range (130-139/85-89 mm Hg). ? People who are overweight or obese. ? People who are African American.  If you are 47-81 years of age, have your blood pressure checked every 3-5 years. If you are 28 years of age or older, have your blood pressure checked every year. You should have your blood pressure measured twice-once when you are at a hospital or clinic, and once when you are not at a hospital or clinic.  Record the average of the two measurements. To check your blood pressure when you are not at a hospital or clinic, you can use: ? An automated blood pressure machine at a pharmacy. ? A home blood pressure monitor.  If you are between 84 years and 33 years old, ask your health care provider if you should take aspirin to prevent strokes.  Have regular diabetes screenings. This involves taking a blood sample to check your fasting blood sugar level. ? If you are at a normal weight and have a low risk for diabetes, have this test once every three years after 81 years of age. ? If you are overweight and have a high risk for diabetes, consider being tested at a younger age or more often. Preventing infection Hepatitis B  If you have a higher risk for hepatitis B, you should be screened for this virus. You are considered at high risk for hepatitis B if: ? You were born in a country where hepatitis B is common. Ask your health care provider which countries are considered high risk. ? Your parents were born in a high-risk country, and you have not been immunized against hepatitis B (hepatitis B vaccine). ? You have HIV or AIDS. ? You use needles to inject street drugs. ? You live with someone who has hepatitis B. ? You have had sex with someone who has hepatitis B. ? You get hemodialysis treatment. ? You take certain medicines for conditions, including cancer, organ transplantation, and autoimmune conditions.  Hepatitis C  Blood testing is recommended for: ? Everyone born from [birth year] through 1963. ? Anyone with known risk factors for hepatitis C.  Sexually transmitted infections (STIs)  You should be screened for sexually transmitted infections (STIs) including gonorrhea and chlamydia if: ? You are sexually active and are younger than 81 years of age. ? You are older than 81 years of age and your health care provider tells you that you are at risk for this type of infection. ? Your sexual  activity has changed since you were last screened and you are at an increased risk for chlamydia or gonorrhea. Ask your health care provider if you are at risk.  If you do not have HIV, but are at risk, it may be recommended that you take a prescription medicine daily to prevent HIV infection. This is called pre-exposure prophylaxis (PrEP). You are considered at risk if: ? You are sexually active and do not regularly use condoms or know the HIV status of  your partner(s). ? You take drugs by injection. ? You are sexually active with a partner who has HIV.  Talk with your health care provider about whether you are at high risk of being infected with HIV. If you choose to begin PrEP, you should first be tested for HIV. You should then be tested every 3 months for as long as you are taking PrEP. Pregnancy  If you are premenopausal and you may become pregnant, ask your health care provider about preconception counseling.  If you may become pregnant, take 400 to 800 micrograms (mcg) of folic acid every day.  If you want to prevent pregnancy, talk to your health care provider about birth control (contraception). Osteoporosis and menopause  Osteoporosis is a disease in which the bones lose minerals and strength with aging. This can result in serious bone fractures. Your risk for osteoporosis can be identified using a bone density scan.  If you are 33 years of age or older, or if you are at risk for osteoporosis and fractures, ask your health care provider if you should be screened.  Ask your health care provider whether you should take a calcium or vitamin D supplement to lower your risk for osteoporosis.  Menopause may have certain physical symptoms and risks.  Hormone replacement therapy may reduce some of these symptoms and risks. Talk to your health care provider about whether hormone replacement therapy is right for you. Follow these instructions at home:  Schedule regular health, dental,  and eye exams.  Stay current with your immunizations.  Do not use any tobacco products including cigarettes, chewing tobacco, or electronic cigarettes.  If you are pregnant, do not drink alcohol.  If you are breastfeeding, limit how much and how often you drink alcohol.  Limit alcohol intake to no more than 1 drink per day for nonpregnant women. One drink equals 12 ounces of beer, 5 ounces of wine, or 1 ounces of hard liquor.  Do not use street drugs.  Do not share needles.  Ask your health care provider for help if you need support or information about quitting drugs.  Tell your health care provider if you often feel depressed.  Tell your health care provider if you have ever been abused or do not feel safe at home. This information is not intended to replace advice given to you by your health care provider. Make sure you discuss any questions you have with your health care provider. Document Released: 08/28/2010 Document Revised: 07/21/2015 Document Reviewed: 11/16/2014 Elsevier Interactive Patient Education  [employer].

## 2016-10-26 LAB — LIPID PANEL
CHOL/HDL RATIO: 3.4 ratio (ref <5.0)
Cholesterol: 140 mg/dL (ref <200)
HDL: 41 mg/dL — ABNORMAL LOW (ref >50)
LDL CALC: 73 mg/dL (ref <100)
Triglycerides: 131 mg/dL (ref <150)
VLDL: 26 mg/dL (ref <30)

## 2016-10-26 LAB — HEPATIC FUNCTION PANEL
ALBUMIN: 4.3 g/dL (ref 3.6–5.1)
ALK PHOS: 80 U/L (ref 33–130)
ALT: 19 U/L (ref 6–29)
AST: 25 U/L (ref 10–35)
BILIRUBIN DIRECT: 0.2 mg/dL (ref <=0.2)
BILIRUBIN INDIRECT: 0.6 mg/dL (ref 0.2–1.2)
BILIRUBIN TOTAL: 0.8 mg/dL (ref 0.2–1.2)
Total Protein: 6.8 g/dL (ref 6.1–8.1)

## 2016-10-26 LAB — BASIC METABOLIC PANEL
BUN: 76 mg/dL — ABNORMAL HIGH (ref 7–25)
CO2: 22 mmol/L (ref 20–32)
Calcium: 9.4 mg/dL (ref 8.6–10.4)
Chloride: 103 mmol/L (ref 98–110)
Creat: 1.81 mg/dL — ABNORMAL HIGH (ref 0.60–0.88)
Glucose, Bld: 115 mg/dL — ABNORMAL HIGH (ref 65–99)
POTASSIUM: 3.8 mmol/L (ref 3.5–5.3)
SODIUM: 143 mmol/L (ref 135–146)

## 2016-10-26 LAB — TSH: TSH: 4.05 m[IU]/L

## 2016-11-05 DIAGNOSIS — M2041 Other hammer toe(s) (acquired), right foot: Secondary | ICD-10-CM | POA: Diagnosis not present

## 2016-11-05 DIAGNOSIS — M2042 Other hammer toe(s) (acquired), left foot: Secondary | ICD-10-CM | POA: Diagnosis not present

## 2016-11-05 DIAGNOSIS — L602 Onychogryphosis: Secondary | ICD-10-CM | POA: Diagnosis not present

## 2016-11-24 ENCOUNTER — Other Ambulatory Visit: Payer: Self-pay | Admitting: Family Medicine

## 2016-12-07 ENCOUNTER — Ambulatory Visit (INDEPENDENT_AMBULATORY_CARE_PROVIDER_SITE_OTHER): Payer: Medicare HMO | Admitting: General Practice

## 2016-12-07 DIAGNOSIS — Z5181 Encounter for therapeutic drug level monitoring: Secondary | ICD-10-CM

## 2016-12-07 DIAGNOSIS — I4891 Unspecified atrial fibrillation: Secondary | ICD-10-CM

## 2016-12-07 DIAGNOSIS — Z7901 Long term (current) use of anticoagulants: Secondary | ICD-10-CM | POA: Insufficient documentation

## 2016-12-07 LAB — POCT INR: INR: 2.5

## 2016-12-07 NOTE — Patient Instructions (Signed)
Pre visit review using our clinic review tool, if applicable. No additional management support is needed unless otherwise documented below in the visit note. 

## 2017-02-01 ENCOUNTER — Ambulatory Visit (INDEPENDENT_AMBULATORY_CARE_PROVIDER_SITE_OTHER): Payer: Medicare HMO | Admitting: General Practice

## 2017-02-01 DIAGNOSIS — Z7901 Long term (current) use of anticoagulants: Secondary | ICD-10-CM | POA: Diagnosis not present

## 2017-02-01 DIAGNOSIS — I4891 Unspecified atrial fibrillation: Secondary | ICD-10-CM

## 2017-02-01 LAB — POCT INR: INR: 3.1

## 2017-02-01 NOTE — Patient Instructions (Signed)
Pre visit review using our clinic review tool, if applicable. No additional management support is needed unless otherwise documented below in the visit note. 

## 2017-02-12 ENCOUNTER — Other Ambulatory Visit: Payer: Self-pay | Admitting: Family Medicine

## 2017-03-04 ENCOUNTER — Other Ambulatory Visit: Payer: Self-pay | Admitting: Family Medicine

## 2017-03-15 ENCOUNTER — Ambulatory Visit (INDEPENDENT_AMBULATORY_CARE_PROVIDER_SITE_OTHER): Payer: Medicare HMO | Admitting: General Practice

## 2017-03-15 DIAGNOSIS — Z7901 Long term (current) use of anticoagulants: Secondary | ICD-10-CM | POA: Diagnosis not present

## 2017-03-15 LAB — POCT INR: INR: 2

## 2017-03-15 NOTE — Patient Instructions (Addendum)
Pre visit review using our clinic review tool, if applicable. No additional management support is needed unless otherwise documented below in the visit note.  Continue to take 1 tablet daily except 1 1/2 tablets on Mon/Wed/Friday.  Re-check in 6 weeks.

## 2017-03-18 DIAGNOSIS — D631 Anemia in chronic kidney disease: Secondary | ICD-10-CM | POA: Diagnosis not present

## 2017-03-18 DIAGNOSIS — N2581 Secondary hyperparathyroidism of renal origin: Secondary | ICD-10-CM | POA: Diagnosis not present

## 2017-03-18 DIAGNOSIS — I4891 Unspecified atrial fibrillation: Secondary | ICD-10-CM | POA: Diagnosis not present

## 2017-03-18 DIAGNOSIS — N183 Chronic kidney disease, stage 3 (moderate): Secondary | ICD-10-CM | POA: Diagnosis not present

## 2017-04-15 ENCOUNTER — Other Ambulatory Visit: Payer: Self-pay | Admitting: Family Medicine

## 2017-04-15 DIAGNOSIS — M1000 Idiopathic gout, unspecified site: Secondary | ICD-10-CM

## 2017-04-26 ENCOUNTER — Ambulatory Visit (INDEPENDENT_AMBULATORY_CARE_PROVIDER_SITE_OTHER): Payer: Medicare HMO | Admitting: General Practice

## 2017-04-26 DIAGNOSIS — I4891 Unspecified atrial fibrillation: Secondary | ICD-10-CM | POA: Diagnosis not present

## 2017-04-26 DIAGNOSIS — Z7901 Long term (current) use of anticoagulants: Secondary | ICD-10-CM | POA: Diagnosis not present

## 2017-04-26 LAB — POCT INR: INR: 2.5

## 2017-04-26 NOTE — Patient Instructions (Addendum)
Pre visit review using our clinic review tool, if applicable. No additional management support is needed unless otherwise documented below in the visit note.  Continue to take 1 tablet daily except 1 1/2 tablets on Mon/Wed/Friday.  Re-check in 6 weeks.

## 2017-05-02 ENCOUNTER — Ambulatory Visit: Payer: Medicare HMO | Admitting: Family Medicine

## 2017-05-02 ENCOUNTER — Ambulatory Visit: Payer: Self-pay | Admitting: *Deleted

## 2017-05-02 NOTE — Telephone Encounter (Signed)
Pt's daughter called because she feel like the patient is more fatigued. She does not want to do anything but sleep. She will take her meds but can not remember why she is taking them.  She attempts to do things but will fall asleep. She is also having problems with swallowing at times. Her daughter thinks things have gotten progressively  worse with her.  She had an appointment for today but had to cancel because her mom said that she could not make it today. She is not on any new medications. No c/o pain. Flow notified at the office for any recommendations.  Appointment made per protocol for next week. Daughter advised to call 911 if her mom gets worse or any new symptoms. She stated she would.  Reason for Disposition . [1] Fatigue (i.e., tires easily, decreased energy) AND [2] persists > 1 week  Answer Assessment - Initial Assessment Questions 1. DESCRIPTION: "Describe how you are feeling."     Feels tired all the time 2. SEVERITY: "How bad is it?"  "Can you stand and walk?"   - MILD - Feels weak or tired, but does not interfere with work, school or normal activities   - MODERATE - Able to stand and walk; weakness interferes with work, school, or normal activities   - SEVERE - Unable to stand or walk     moderate 3. ONSET:  "When did the weakness begin?"     Been several months and wants to sleep all day 4. CAUSE: "What do you think is causing the weakness?"     [place]  5. MEDICINES: "Have you recently started a new medicine or had a change in the amount of a medicine?"     none 6. OTHER SYMPTOMS: "Do you have any other symptoms?" (e.g., chest pain, fever, cough, SOB, vomiting, diarrhea, bleeding)     SOB because of being out of shape 7. PREGNANCY: "Is there any chance you are pregnant?" "When was your last menstrual period?"     no  Protocols used: WEAKNESS (GENERALIZED) AND FATIGUE-A-AH

## 2017-05-06 ENCOUNTER — Encounter: Payer: Self-pay | Admitting: Family Medicine

## 2017-05-06 ENCOUNTER — Other Ambulatory Visit: Payer: Self-pay

## 2017-05-06 ENCOUNTER — Ambulatory Visit (INDEPENDENT_AMBULATORY_CARE_PROVIDER_SITE_OTHER): Payer: Medicare HMO | Admitting: Family Medicine

## 2017-05-06 VITALS — BP 146/80 | HR 50 | Temp 97.2°F | Resp 14 | Ht 60.0 in | Wt 172.4 lb

## 2017-05-06 DIAGNOSIS — I4891 Unspecified atrial fibrillation: Secondary | ICD-10-CM | POA: Diagnosis not present

## 2017-05-06 DIAGNOSIS — I10 Essential (primary) hypertension: Secondary | ICD-10-CM

## 2017-05-06 DIAGNOSIS — G301 Alzheimer's disease with late onset: Secondary | ICD-10-CM | POA: Diagnosis not present

## 2017-05-06 DIAGNOSIS — F0390 Unspecified dementia without behavioral disturbance: Secondary | ICD-10-CM | POA: Insufficient documentation

## 2017-05-06 DIAGNOSIS — K529 Noninfective gastroenteritis and colitis, unspecified: Secondary | ICD-10-CM | POA: Diagnosis not present

## 2017-05-06 DIAGNOSIS — E785 Hyperlipidemia, unspecified: Secondary | ICD-10-CM

## 2017-05-06 DIAGNOSIS — F0280 Dementia in other diseases classified elsewhere without behavioral disturbance: Secondary | ICD-10-CM

## 2017-05-06 MED ORDER — HYDRALAZINE HCL 100 MG PO TABS: 100.0000 mg | ORAL_TABLET | Freq: Three times a day (TID) | ORAL | 3 refills | Status: AC

## 2017-05-06 NOTE — Progress Notes (Signed)
   Subjective:    Patient ID: [name], female    DOB: 1930-01-27, 84 y.o.   MRN: [id number]  HPI HTN- daughter reports BP was 170/60 last night.  Pt denies feeling badly, daughter reports 'it doesn't effect her at all'.  Currently on Amlodipine 5mg , Lasix 20mg , and hydralazine 100mg  BID.  Dementia- daughter reports she is sleeping quite a bit.  Pt reports she is bored but daughter indicates that she is not able to do word finds, puzzles, not interested in coloring.  Daughter is having a hard time finding things to interest her and pt keeps 'nodding off'.  Confusion is worsening.  Sleeping from 10:15pm- 6pm w/ exception of daughter waking her to take her pills.  Pt is requiring assistance to get up  Hyperlipidemia- chronic problem, on Simvastatin 40mg  daily  Afib- following w/ Dr Ladona Ridgelaylor.  On Coumadin.  Diarrhea- daughter reports pt is having 2-3 loose stools daily.  No relief w/ probiotics.  Pt eats a diet high in fiber (raw veggies).  Not taking any immodium.     Review of Systems For ROS see HPI     Objective:   Physical Exam  Constitutional: She appears well-developed and well-nourished. No distress.  HENT:  Head: Normocephalic and atraumatic.  Neck: No thyromegaly present.  Cardiovascular: Intact distal pulses.  Irregularly irregular S1/S2  Pulmonary/Chest: Effort normal and breath sounds normal. No respiratory distress. She has no wheezes. She has no rales.  Abdominal: Soft. Bowel sounds are normal. She exhibits no distension. There is no tenderness. There is no rebound and no guarding.  Musculoskeletal: She exhibits no edema.  Lymphadenopathy:    She has no cervical adenopathy.  Neurological:  Oriented to person only  Skin: Skin is warm and dry.  Psychiatric:  Flat, withdrawn  Vitals reviewed.         Assessment & Plan:

## 2017-05-06 NOTE — Patient Instructions (Signed)
Follow up in 6-8 weeks to recheck BP We'll notify you of your lab results and make any changes if needed We'll call you with your Palliative Care consult for them to help you with planning and any assistance you may need START a once daily Immodium to help prevent diarrhea.  Increase to twice daily if needed INCREASE Hydralazine to 3x/day for better BP control Call with any questions or concerns Hang in there!!! Happy Early [place] OuchBirthday!!!

## 2017-05-07 ENCOUNTER — Encounter: Payer: Self-pay | Admitting: General Practice

## 2017-05-07 LAB — CBC WITH DIFFERENTIAL/PLATELET
BASOS ABS: 41 {cells}/uL (ref 0–200)
Basophils Relative: 0.8 %
EOS ABS: 71 {cells}/uL (ref 15–500)
EOS PCT: 1.4 %
HCT: 37.1 % (ref 35.0–45.0)
HEMOGLOBIN: 12.4 g/dL (ref 11.7–15.5)
Lymphs Abs: 903 cells/uL (ref 850–3900)
MCH: 30.2 pg (ref 27.0–33.0)
MCHC: 33.4 g/dL (ref 32.0–36.0)
MCV: 90.3 fL (ref 80.0–100.0)
MONOS PCT: 10.9 %
MPV: 12.1 fL (ref 7.5–12.5)
NEUTROS ABS: 3529 {cells}/uL (ref 1500–7800)
Neutrophils Relative %: 69.2 %
PLATELETS: 157 10*3/uL (ref 140–400)
RBC: 4.11 10*6/uL (ref 3.80–5.10)
RDW: 14.4 % (ref 11.0–15.0)
TOTAL LYMPHOCYTE: 17.7 %
WBC mixed population: 556 cells/uL (ref 200–950)
WBC: 5.1 10*3/uL (ref 3.8–10.8)

## 2017-05-07 LAB — BASIC METABOLIC PANEL
BUN / CREAT RATIO: 41 (calc) — AB (ref 6–22)
BUN: 64 mg/dL — ABNORMAL HIGH (ref 7–25)
CALCIUM: 9.5 mg/dL (ref 8.6–10.4)
CHLORIDE: 109 mmol/L (ref 98–110)
CO2: 26 mmol/L (ref 20–32)
Creat: 1.57 mg/dL — ABNORMAL HIGH (ref 0.60–0.88)
GLUCOSE: 111 mg/dL — AB (ref 65–99)
Potassium: 3.3 mmol/L — ABNORMAL LOW (ref 3.5–5.3)
SODIUM: 145 mmol/L (ref 135–146)

## 2017-05-07 LAB — HEPATIC FUNCTION PANEL
AG Ratio: 1.5 (calc) (ref 1.0–2.5)
ALBUMIN MSPROF: 4.3 g/dL (ref 3.6–5.1)
ALKALINE PHOSPHATASE (APISO): 82 U/L (ref 33–130)
ALT: 23 U/L (ref 6–29)
AST: 29 U/L (ref 10–35)
BILIRUBIN DIRECT: 0.1 mg/dL (ref 0.0–0.2)
BILIRUBIN TOTAL: 0.6 mg/dL (ref 0.2–1.2)
Globulin: 2.8 g/dL (calc) (ref 1.9–3.7)
Indirect Bilirubin: 0.5 mg/dL (calc) (ref 0.2–1.2)
Total Protein: 7.1 g/dL (ref 6.1–8.1)

## 2017-05-07 LAB — LIPID PANEL
Cholesterol: 151 mg/dL (ref <200)
HDL: 40 mg/dL — AB (ref >50)
LDL CHOLESTEROL (CALC): 88 mg/dL
NON-HDL CHOLESTEROL (CALC): 111 mg/dL (ref <130)
TRIGLYCERIDES: 135 mg/dL (ref <150)
Total CHOL/HDL Ratio: 3.8 (calc) (ref <5.0)

## 2017-05-07 LAB — TSH: TSH: 4.19 m[IU]/L (ref 0.40–4.50)

## 2017-05-07 NOTE — Assessment & Plan Note (Signed)
Deteriorated recently.  Dr Briant CedarMattingly recommended that they increase to Hydralazine TID but daughter has not done this.  Again suggested this as this will likely better control her BP.  Daughter is in agreement w/ this plan.

## 2017-05-07 NOTE — Assessment & Plan Note (Signed)
Ongoing issue.  No improvement w/ probiotics.  As pt is not a candidate for a GI workup, recommended starting prophylactic daily Immodium.  Pt expressed understanding and is in agreement w/ plan.

## 2017-05-07 NOTE — Assessment & Plan Note (Signed)
Chronic problem.  Pt is tolerating statin w/o difficulty but given her age and her dementia, I wonder about the need for this going forward.  Daughter prefers not to make changes at this time.  Will continue at this time and re-assess at future visits.

## 2017-05-07 NOTE — Assessment & Plan Note (Signed)
Deteriorated.  Pt is now sleeping ~20 hrs/day w/ exception of waking to take pills and use the restroom.  She is not at all engaged in what is happening around her.  Had long discussion with daughter about the natural disease progression and that the end may be coming more rapidly than she realizes.  Discussed palliative care to assist her going forward as she is the only caregiver- referral placed.  Daughter is poorly educated regarding disease process and >50% of our 45 minute visit was spent counseling on this.

## 2017-05-07 NOTE — Assessment & Plan Note (Signed)
Chronic problem.  On coumadin and following w/ coumadin clinic.  Pt has not had any recent falls- but I am concerned w/ her advanced age and dementia that this is risky.  Currently rate controlled and asymptomatic.

## 2017-06-07 ENCOUNTER — Ambulatory Visit: Payer: Medicare HMO

## 2017-06-11 ENCOUNTER — Ambulatory Visit: Payer: Medicare HMO

## 2017-06-18 ENCOUNTER — Ambulatory Visit (INDEPENDENT_AMBULATORY_CARE_PROVIDER_SITE_OTHER): Payer: Medicare HMO | Admitting: General Practice

## 2017-06-18 DIAGNOSIS — Z7901 Long term (current) use of anticoagulants: Secondary | ICD-10-CM

## 2017-06-18 DIAGNOSIS — I4891 Unspecified atrial fibrillation: Secondary | ICD-10-CM

## 2017-06-18 LAB — POCT INR: INR: 3

## 2017-06-18 NOTE — Patient Instructions (Addendum)
Pre visit review using our clinic review tool, if applicable. No additional management support is needed unless otherwise documented below in the visit note.  Continue to take 1 tablet daily except 1 1/2 tablets on Mon/Friday.  Re-check in 6 weeks.

## 2017-06-26 ENCOUNTER — Other Ambulatory Visit: Payer: Self-pay

## 2017-06-26 ENCOUNTER — Encounter: Payer: Self-pay | Admitting: Family Medicine

## 2017-06-26 ENCOUNTER — Ambulatory Visit (INDEPENDENT_AMBULATORY_CARE_PROVIDER_SITE_OTHER): Payer: Medicare HMO | Admitting: Family Medicine

## 2017-06-26 VITALS — BP 148/60 | HR 44 | Temp 98.6°F | Resp 14 | Ht 60.0 in | Wt 171.0 lb

## 2017-06-26 DIAGNOSIS — R001 Bradycardia, unspecified: Secondary | ICD-10-CM | POA: Diagnosis not present

## 2017-06-26 DIAGNOSIS — I4891 Unspecified atrial fibrillation: Secondary | ICD-10-CM | POA: Diagnosis not present

## 2017-06-26 DIAGNOSIS — I10 Essential (primary) hypertension: Secondary | ICD-10-CM | POA: Diagnosis not present

## 2017-06-26 NOTE — Assessment & Plan Note (Signed)
Chronic problem.  BP remains elevated but I am hesitant to adjust medication due to HR of 44.  Pt has previously seen Dr Ladona Ridgel.  Will refer back to Cards.  This is a difficult situation given pt's dementia, age, and medical comorbidities.  Daughter understands this and knows that options are limited.  Will follow.

## 2017-06-26 NOTE — Patient Instructions (Signed)
Follow up in 3 months to recheck BP We'll call you with your cardiology appt NO MED CHANGES at this time Call with any questions or concerns Happy Spring!!!

## 2017-06-26 NOTE — Assessment & Plan Note (Signed)
Pt's HR remains in the low 40s.  Pt and daughter deny sxs from bradycardia.  Given the low HR will refer back to Dr Ladona Ridgel.  Daughter expressed understanding and agreement.

## 2017-06-26 NOTE — Progress Notes (Signed)
   Subjective:    Patient ID: [name], female    DOB: 1929-11-05, 84 y.o.   MRN: [id number]  HPI HTN- chronic problem, on Amlodipine  daily and Hydralazine  TID.  At last visit she increased from 2 --> 3x/day.  Daughter reports no improvement in BP.  In fact, BPs have been consistently in the 150-160s and last 2 days in the 170s/60-70s.  No CP, dizziness.  + chronic SOB but 'nothing out of the ordinary'.  No edema.   Review of Systems For ROS see HPI     Objective:   Physical Exam  Constitutional: She appears well-developed and well-nourished. No distress.  HENT:  Head: Normocephalic and atraumatic.  Cardiovascular: Intact distal pulses.  Irregular and bradycardic  Pulmonary/Chest: Effort normal and breath sounds normal. No stridor. No respiratory distress. She has no wheezes. She has no rales.  Musculoskeletal: She exhibits no edema.  Neurological: She is alert.  Oriented to person, place  Skin: Skin is warm and dry.  Psychiatric: She has a normal mood and affect. Her behavior is normal.  Vitals reviewed.         Assessment & Plan:

## 2017-06-26 NOTE — Assessment & Plan Note (Signed)
Remains irregular.  She is bradycardic at 46 and anticoagulated w/ coumadin clinic.

## 2017-06-27 ENCOUNTER — Other Ambulatory Visit: Payer: Self-pay | Admitting: Family Medicine

## 2017-07-05 ENCOUNTER — Encounter: Payer: Self-pay | Admitting: Family Medicine

## 2017-07-05 NOTE — Telephone Encounter (Signed)
Call [facility], LM for Marcelino Duster to [facility] with appt info for pt.

## 2017-07-08 ENCOUNTER — Telehealth: Payer: Self-pay | Admitting: Family Medicine

## 2017-07-08 DIAGNOSIS — Z0279 Encounter for issue of other medical certificate: Secondary | ICD-10-CM | POA: Diagnosis not present

## 2017-07-08 NOTE — Telephone Encounter (Signed)
Paperwork given to PCP for completion.  

## 2017-07-08 NOTE — Telephone Encounter (Signed)
Received paperwork via mail, from [facility]. Orders and a DNR. Requested that the forms be reviewed, signed and mailed back. Placed in front bin with charge sheet.

## 2017-07-08 NOTE — Telephone Encounter (Signed)
Forms completed and placed in basket 

## 2017-07-09 NOTE — Telephone Encounter (Signed)
Paperwork placed in envelopes and into basket for courier pick up.

## 2017-07-12 ENCOUNTER — Telehealth: Payer: Self-pay | Admitting: Family Medicine

## 2017-07-12 NOTE — Telephone Encounter (Signed)
Noted! Thank you

## 2017-07-12 NOTE — Telephone Encounter (Signed)
Please advise 

## 2017-07-12 NOTE — Telephone Encounter (Signed)
SW patients daughter, Carmin Richmond.  Daughter states pt has h/o DOE but seemed worse this morning. She reports BP this morning was higher than normal with decreased oxygen saturation (87%). Tammy, RN from [facility] visited pt this morning. She states lungs were clear, afebrile and diuresing well. Her O2 Sat up to 93% by Tammy. While on the phone with daughter, O2 saturation 93%, denies SOB. Encouraged daughter to take patient to the ER for evaluation d/t low sat and age. Daughter states she feels like patient is feeling better since this morning and is hesitant to take her to ER. She plans to monitor her BP and saturation level, will take to ER if symptoms worsen.

## 2017-07-12 NOTE — Telephone Encounter (Addendum)
Copied from CRM [id number]. Topic: General - Other >> Jul 12, 2017  1:01 PM Leafy Ro wrote: Reason for CRM: tammy rn  with [facility] . Pt has a cough started on Monday and her oxygen level is 87 to 88% when she is laying down and 90 to 92 % when she is up. tammy would like a callback to give further details. Please call back today. Pt decline an appt for cough

## 2017-07-12 NOTE — Telephone Encounter (Signed)
Someone please call and advise that if O2 levels are in the 80s-low 90s she MUST be seen and preferably in the ER as she is 84 yrs old w/ dementia and at risk for aspiration PNA

## 2017-07-14 ENCOUNTER — Encounter (HOSPITAL_COMMUNITY): Payer: Self-pay | Admitting: Emergency Medicine

## 2017-07-14 ENCOUNTER — Emergency Department (HOSPITAL_COMMUNITY): Payer: Medicare HMO

## 2017-07-14 ENCOUNTER — Inpatient Hospital Stay (HOSPITAL_COMMUNITY)
Admission: EM | Admit: 2017-07-14 | Discharge: 2017-07-22 | DRG: 291 | Disposition: A | Discharge status: Hospice | Payer: Medicare HMO | Attending: Internal Medicine | Admitting: Internal Medicine

## 2017-07-14 ENCOUNTER — Other Ambulatory Visit: Payer: Self-pay

## 2017-07-14 DIAGNOSIS — G301 Alzheimer's disease with late onset: Secondary | ICD-10-CM | POA: Diagnosis not present

## 2017-07-14 DIAGNOSIS — E1122 Type 2 diabetes mellitus with diabetic chronic kidney disease: Secondary | ICD-10-CM | POA: Diagnosis present

## 2017-07-14 DIAGNOSIS — K219 Gastro-esophageal reflux disease without esophagitis: Secondary | ICD-10-CM | POA: Diagnosis present

## 2017-07-14 DIAGNOSIS — Z79890 Hormone replacement therapy (postmenopausal): Secondary | ICD-10-CM

## 2017-07-14 DIAGNOSIS — J90 Pleural effusion, not elsewhere classified: Secondary | ICD-10-CM | POA: Diagnosis not present

## 2017-07-14 DIAGNOSIS — D62 Acute posthemorrhagic anemia: Secondary | ICD-10-CM | POA: Diagnosis present

## 2017-07-14 DIAGNOSIS — I4891 Unspecified atrial fibrillation: Secondary | ICD-10-CM | POA: Diagnosis present

## 2017-07-14 DIAGNOSIS — Z8042 Family history of malignant neoplasm of prostate: Secondary | ICD-10-CM

## 2017-07-14 DIAGNOSIS — I5033 Acute on chronic diastolic (congestive) heart failure: Secondary | ICD-10-CM | POA: Diagnosis present

## 2017-07-14 DIAGNOSIS — E860 Dehydration: Secondary | ICD-10-CM | POA: Diagnosis not present

## 2017-07-14 DIAGNOSIS — Z7189 Other specified counseling: Secondary | ICD-10-CM | POA: Diagnosis not present

## 2017-07-14 DIAGNOSIS — E876 Hypokalemia: Secondary | ICD-10-CM | POA: Diagnosis not present

## 2017-07-14 DIAGNOSIS — R0902 Hypoxemia: Secondary | ICD-10-CM | POA: Diagnosis present

## 2017-07-14 DIAGNOSIS — J189 Pneumonia, unspecified organism: Secondary | ICD-10-CM | POA: Diagnosis not present

## 2017-07-14 DIAGNOSIS — M109 Gout, unspecified: Secondary | ICD-10-CM | POA: Diagnosis present

## 2017-07-14 DIAGNOSIS — N390 Urinary tract infection, site not specified: Secondary | ICD-10-CM | POA: Diagnosis not present

## 2017-07-14 DIAGNOSIS — R791 Abnormal coagulation profile: Secondary | ICD-10-CM | POA: Diagnosis not present

## 2017-07-14 DIAGNOSIS — R001 Bradycardia, unspecified: Secondary | ICD-10-CM | POA: Diagnosis not present

## 2017-07-14 DIAGNOSIS — J45909 Unspecified asthma, uncomplicated: Secondary | ICD-10-CM | POA: Diagnosis present

## 2017-07-14 DIAGNOSIS — R0600 Dyspnea, unspecified: Secondary | ICD-10-CM

## 2017-07-14 DIAGNOSIS — N179 Acute kidney failure, unspecified: Secondary | ICD-10-CM

## 2017-07-14 DIAGNOSIS — E785 Hyperlipidemia, unspecified: Secondary | ICD-10-CM | POA: Diagnosis present

## 2017-07-14 DIAGNOSIS — Z515 Encounter for palliative care: Secondary | ICD-10-CM | POA: Diagnosis not present

## 2017-07-14 DIAGNOSIS — D649 Anemia, unspecified: Secondary | ICD-10-CM | POA: Diagnosis not present

## 2017-07-14 DIAGNOSIS — I130 Hypertensive heart and chronic kidney disease with heart failure and stage 1 through stage 4 chronic kidney disease, or unspecified chronic kidney disease: Principal | ICD-10-CM | POA: Diagnosis present

## 2017-07-14 DIAGNOSIS — F0390 Unspecified dementia without behavioral disturbance: Secondary | ICD-10-CM | POA: Diagnosis present

## 2017-07-14 DIAGNOSIS — D6832 Hemorrhagic disorder due to extrinsic circulating anticoagulants: Secondary | ICD-10-CM | POA: Diagnosis not present

## 2017-07-14 DIAGNOSIS — R05 Cough: Secondary | ICD-10-CM | POA: Diagnosis not present

## 2017-07-14 DIAGNOSIS — B961 Klebsiella pneumoniae [K. pneumoniae] as the cause of diseases classified elsewhere: Secondary | ICD-10-CM | POA: Diagnosis present

## 2017-07-14 DIAGNOSIS — M2550 Pain in unspecified joint: Secondary | ICD-10-CM | POA: Diagnosis not present

## 2017-07-14 DIAGNOSIS — Z66 Do not resuscitate: Secondary | ICD-10-CM | POA: Diagnosis not present

## 2017-07-14 DIAGNOSIS — K529 Noninfective gastroenteritis and colitis, unspecified: Secondary | ICD-10-CM | POA: Diagnosis present

## 2017-07-14 DIAGNOSIS — I10 Essential (primary) hypertension: Secondary | ICD-10-CM | POA: Diagnosis not present

## 2017-07-14 DIAGNOSIS — K8020 Calculus of gallbladder without cholecystitis without obstruction: Secondary | ICD-10-CM | POA: Diagnosis not present

## 2017-07-14 DIAGNOSIS — I482 Chronic atrial fibrillation: Secondary | ICD-10-CM | POA: Diagnosis present

## 2017-07-14 DIAGNOSIS — J181 Lobar pneumonia, unspecified organism: Secondary | ICD-10-CM | POA: Diagnosis not present

## 2017-07-14 DIAGNOSIS — E039 Hypothyroidism, unspecified: Secondary | ICD-10-CM | POA: Diagnosis present

## 2017-07-14 DIAGNOSIS — R509 Fever, unspecified: Secondary | ICD-10-CM | POA: Diagnosis not present

## 2017-07-14 DIAGNOSIS — Z8249 Family history of ischemic heart disease and other diseases of the circulatory system: Secondary | ICD-10-CM

## 2017-07-14 DIAGNOSIS — N184 Chronic kidney disease, stage 4 (severe): Secondary | ICD-10-CM | POA: Diagnosis present

## 2017-07-14 DIAGNOSIS — K922 Gastrointestinal hemorrhage, unspecified: Secondary | ICD-10-CM | POA: Diagnosis not present

## 2017-07-14 DIAGNOSIS — I5032 Chronic diastolic (congestive) heart failure: Secondary | ICD-10-CM | POA: Diagnosis not present

## 2017-07-14 DIAGNOSIS — Z7951 Long term (current) use of inhaled steroids: Secondary | ICD-10-CM

## 2017-07-14 DIAGNOSIS — Z6833 Body mass index (BMI) 33.0-33.9, adult: Secondary | ICD-10-CM

## 2017-07-14 DIAGNOSIS — R059 Cough, unspecified: Secondary | ICD-10-CM

## 2017-07-14 DIAGNOSIS — E669 Obesity, unspecified: Secondary | ICD-10-CM | POA: Diagnosis present

## 2017-07-14 DIAGNOSIS — Z7401 Bed confinement status: Secondary | ICD-10-CM | POA: Diagnosis not present

## 2017-07-14 DIAGNOSIS — T45515A Adverse effect of anticoagulants, initial encounter: Secondary | ICD-10-CM | POA: Diagnosis present

## 2017-07-14 DIAGNOSIS — Z7901 Long term (current) use of anticoagulants: Secondary | ICD-10-CM

## 2017-07-14 DIAGNOSIS — F0280 Dementia in other diseases classified elsewhere without behavioral disturbance: Secondary | ICD-10-CM | POA: Diagnosis not present

## 2017-07-14 DIAGNOSIS — I340 Nonrheumatic mitral (valve) insufficiency: Secondary | ICD-10-CM | POA: Diagnosis not present

## 2017-07-14 DIAGNOSIS — Z90710 Acquired absence of both cervix and uterus: Secondary | ICD-10-CM

## 2017-07-14 DIAGNOSIS — G309 Alzheimer's disease, unspecified: Secondary | ICD-10-CM | POA: Diagnosis not present

## 2017-07-14 DIAGNOSIS — Z808 Family history of malignant neoplasm of other organs or systems: Secondary | ICD-10-CM

## 2017-07-14 LAB — URINALYSIS, ROUTINE W REFLEX MICROSCOPIC
Bilirubin Urine: NEGATIVE
Glucose, UA: NEGATIVE mg/dL
Hgb urine dipstick: NEGATIVE
Ketones, ur: NEGATIVE mg/dL
Nitrite: POSITIVE — AB
Protein, ur: NEGATIVE mg/dL
SPECIFIC GRAVITY, URINE: 1.011 (ref 1.005–1.030)
WBC, UA: >50 WBC/hpf — ABNORMAL HIGH (ref 0–5)
pH: 5 (ref 5.0–8.0)

## 2017-07-14 LAB — BASIC METABOLIC PANEL
Anion gap: 13 (ref 5–15)
BUN: 86 mg/dL — AB (ref 6–20)
CHLORIDE: 103 mmol/L (ref 101–111)
CO2: 22 mmol/L (ref 22–32)
Calcium: 8.8 mg/dL — ABNORMAL LOW (ref 8.9–10.3)
Creatinine, Ser: 1.87 mg/dL — ABNORMAL HIGH (ref 0.44–1.00)
GFR calc Af Amer: 27 mL/min — ABNORMAL LOW (ref >60)
GFR, EST NON AFRICAN AMERICAN: 23 mL/min — AB (ref >60)
GLUCOSE: 154 mg/dL — AB (ref 65–99)
POTASSIUM: 3.4 mmol/L — AB (ref 3.5–5.1)
Sodium: 138 mmol/L (ref 135–145)

## 2017-07-14 LAB — CBC
HEMATOCRIT: 29 % — AB (ref 36.0–46.0)
HEMATOCRIT: 27.8 % — AB (ref 36.0–46.0)
HEMOGLOBIN: 8.9 g/dL — AB (ref 12.0–15.0)
Hemoglobin: 9.5 g/dL — ABNORMAL LOW (ref 12.0–15.0)
MCH: 29.8 pg (ref 26.0–34.0)
MCH: 29.1 pg (ref 26.0–34.0)
MCHC: 32.8 g/dL (ref 30.0–36.0)
MCHC: 32 g/dL (ref 30.0–36.0)
MCV: 90.9 fL (ref 78.0–100.0)
MCV: 90.8 fL (ref 78.0–100.0)
Platelets: 221 10*3/uL (ref 150–400)
Platelets: 193 10*3/uL (ref 150–400)
RBC: 3.19 MIL/uL — ABNORMAL LOW (ref 3.87–5.11)
RBC: 3.06 MIL/uL — ABNORMAL LOW (ref 3.87–5.11)
RDW: 16.7 % — AB (ref 11.5–15.5)
RDW: 16.8 % — ABNORMAL HIGH (ref 11.5–15.5)
WBC: 10.2 10*3/uL (ref 4.0–10.5)
WBC: 9.1 10*3/uL (ref 4.0–10.5)

## 2017-07-14 LAB — I-STAT CG4 LACTIC ACID, ED
LACTIC ACID, VENOUS: 2.36 mmol/L — AB (ref 0.5–1.9)
Lactic Acid, Venous: 1 mmol/L (ref 0.5–1.9)

## 2017-07-14 LAB — POC OCCULT BLOOD, ED: Fecal Occult Bld: POSITIVE — AB

## 2017-07-14 LAB — HEPATIC FUNCTION PANEL
ALK PHOS: 84 U/L (ref 38–126)
ALT: 106 U/L — AB (ref 14–54)
AST: 242 U/L — AB (ref 15–41)
Albumin: 3.2 g/dL — ABNORMAL LOW (ref 3.5–5.0)
BILIRUBIN DIRECT: 0.1 mg/dL (ref 0.1–0.5)
BILIRUBIN INDIRECT: 0.7 mg/dL (ref 0.3–0.9)
BILIRUBIN TOTAL: 0.8 mg/dL (ref 0.3–1.2)
Total Protein: 7.3 g/dL (ref 6.5–8.1)

## 2017-07-14 LAB — CBG MONITORING, ED: GLUCOSE-CAPILLARY: 182 mg/dL — AB (ref 65–99)

## 2017-07-14 LAB — PROTIME-INR
INR: 6.82
Prothrombin Time: 58.7 seconds — ABNORMAL HIGH (ref 11.4–15.2)

## 2017-07-14 MED ORDER — ADULT MULTIVITAMIN W/MINERALS CH
ORAL_TABLET | Freq: Every day | ORAL | Status: DC
  Administered 2017-07-15 – 2017-07-22 (×8): 1 via ORAL
  Filled 2017-07-14 (×8): qty 1

## 2017-07-14 MED ORDER — SODIUM CHLORIDE 0.9 % IV SOLN
INTRAVENOUS | Status: DC
  Administered 2017-07-14 – 2017-07-16 (×4): via INTRAVENOUS

## 2017-07-14 MED ORDER — ONDANSETRON HCL 4 MG PO TABS: 4.0000 mg | ORAL_TABLET | Freq: Four times a day (QID) | ORAL | Status: DC | PRN

## 2017-07-14 MED ORDER — GUAIFENESIN 100 MG/5ML PO SOLN: 5.0000 mL | ORAL | Status: DC | PRN

## 2017-07-14 MED ORDER — IPRATROPIUM-ALBUTEROL 0.5-2.5 (3) MG/3ML IN SOLN
3.0000 mL | Freq: Four times a day (QID) | RESPIRATORY_TRACT | Status: DC | PRN
  Administered 2017-07-16: 3 mL via RESPIRATORY_TRACT
  Filled 2017-07-14: qty 3

## 2017-07-14 MED ORDER — PHYTONADIONE 5 MG PO TABS
2.5000 mg | ORAL_TABLET | Freq: Once | ORAL | Status: AC
  Administered 2017-07-15: 2.5 mg via ORAL
  Filled 2017-07-14: qty 1

## 2017-07-14 MED ORDER — FLUTICASONE PROPIONATE 50 MCG/ACT NA SUSP
2.0000 | Freq: Every day | NASAL | Status: DC
  Administered 2017-07-16 – 2017-07-21 (×3): 2 via NASAL
  Filled 2017-07-14: qty 16

## 2017-07-14 MED ORDER — LEVOTHYROXINE SODIUM 50 MCG PO TABS
50.0000 ug | ORAL_TABLET | Freq: Every day | ORAL | Status: DC
  Administered 2017-07-15 – 2017-07-22 (×8): 50 ug via ORAL
  Filled 2017-07-14 (×8): qty 1

## 2017-07-14 MED ORDER — PANTOPRAZOLE SODIUM 40 MG IV SOLR: 40.0000 mg | Freq: Two times a day (BID) | INTRAVENOUS | Status: DC

## 2017-07-14 MED ORDER — SODIUM CHLORIDE 0.9 % IV SOLN
8.0000 mg/h | INTRAVENOUS | Status: DC
  Administered 2017-07-14 – 2017-07-17 (×6): 8 mg/h via INTRAVENOUS
  Filled 2017-07-14 (×12): qty 80

## 2017-07-14 MED ORDER — AMLODIPINE BESYLATE 5 MG PO TABS
10.0000 mg | ORAL_TABLET | Freq: Every day | ORAL | Status: DC
  Administered 2017-07-15 – 2017-07-16 (×2): 10 mg via ORAL
  Filled 2017-07-14 (×5): qty 2

## 2017-07-14 MED ORDER — ALLOPURINOL 100 MG PO TABS
100.0000 mg | ORAL_TABLET | Freq: Every day | ORAL | Status: DC
  Administered 2017-07-15 – 2017-07-22 (×8): 100 mg via ORAL
  Filled 2017-07-14 (×8): qty 1

## 2017-07-14 MED ORDER — INSULIN ASPART 100 UNIT/ML ~~LOC~~ SOLN
0.0000 [IU] | SUBCUTANEOUS | Status: DC
  Administered 2017-07-15 (×2): 2 [IU] via SUBCUTANEOUS
  Administered 2017-07-16 (×3): 3 [IU] via SUBCUTANEOUS
  Administered 2017-07-17: 2 [IU] via SUBCUTANEOUS
  Administered 2017-07-17: 3 [IU] via SUBCUTANEOUS
  Administered 2017-07-18: 2 [IU] via SUBCUTANEOUS
  Administered 2017-07-18 (×3): 3 [IU] via SUBCUTANEOUS
  Administered 2017-07-19: 5 [IU] via SUBCUTANEOUS
  Administered 2017-07-21: 3 [IU] via SUBCUTANEOUS
  Administered 2017-07-21: 5 [IU] via SUBCUTANEOUS
  Administered 2017-07-22: 3 [IU] via SUBCUTANEOUS
  Administered 2017-07-22: 2 [IU] via SUBCUTANEOUS

## 2017-07-14 MED ORDER — ATORVASTATIN CALCIUM 20 MG PO TABS
20.0000 mg | ORAL_TABLET | Freq: Every day | ORAL | Status: DC
  Administered 2017-07-15 – 2017-07-21 (×8): 20 mg via ORAL
  Filled 2017-07-14 (×8): qty 1

## 2017-07-14 MED ORDER — LORATADINE 10 MG PO TABS
10.0000 mg | ORAL_TABLET | Freq: Every day | ORAL | Status: DC
  Administered 2017-07-15 – 2017-07-22 (×8): 10 mg via ORAL
  Filled 2017-07-14 (×8): qty 1

## 2017-07-14 MED ORDER — ACETAMINOPHEN 650 MG RE SUPP: 650.0000 mg | Freq: Four times a day (QID) | RECTAL | Status: DC | PRN

## 2017-07-14 MED ORDER — HYDRALAZINE HCL 50 MG PO TABS
100.0000 mg | ORAL_TABLET | Freq: Three times a day (TID) | ORAL | Status: DC
  Administered 2017-07-15 – 2017-07-19 (×4): 100 mg via ORAL
  Filled 2017-07-14 (×12): qty 2

## 2017-07-14 MED ORDER — SODIUM CHLORIDE 0.9 % IV SOLN
80.0000 mg | Freq: Once | INTRAVENOUS | Status: AC
  Administered 2017-07-14: 80 mg via INTRAVENOUS
  Filled 2017-07-14: qty 80

## 2017-07-14 MED ORDER — SODIUM CHLORIDE 0.9 % IV SOLN
1.0000 g | Freq: Once | INTRAVENOUS | Status: AC
  Administered 2017-07-14: 1 g via INTRAVENOUS
  Filled 2017-07-14: qty 10

## 2017-07-14 MED ORDER — SODIUM CHLORIDE 0.9 % IV SOLN
500.0000 mg | Freq: Once | INTRAVENOUS | Status: AC
  Administered 2017-07-14: 500 mg via INTRAVENOUS
  Filled 2017-07-14: qty 500

## 2017-07-14 MED ORDER — SIMVASTATIN 40 MG PO TABS: 40.0000 mg | ORAL_TABLET | Freq: Every day | ORAL | Status: DC

## 2017-07-14 MED ORDER — ONDANSETRON HCL 4 MG/2ML IJ SOLN: 4.0000 mg | Freq: Four times a day (QID) | INTRAMUSCULAR | Status: DC | PRN

## 2017-07-14 MED ORDER — ACETAMINOPHEN 325 MG PO TABS
650.0000 mg | ORAL_TABLET | Freq: Four times a day (QID) | ORAL | Status: DC | PRN
  Administered 2017-07-21 – 2017-07-22 (×2): 650 mg via ORAL
  Filled 2017-07-14 (×2): qty 2

## 2017-07-14 MED ORDER — OXYCODONE HCL 5 MG PO TABS: 5.0000 mg | ORAL_TABLET | ORAL | Status: DC | PRN

## 2017-07-14 MED ORDER — SODIUM CHLORIDE 0.9 % IV BOLUS
500.0000 mL | Freq: Once | INTRAVENOUS | Status: AC
  Administered 2017-07-14: 500 mL via INTRAVENOUS

## 2017-07-14 MED ORDER — FUROSEMIDE 40 MG PO TABS
40.0000 mg | ORAL_TABLET | Freq: Every day | ORAL | Status: DC
  Administered 2017-07-16 – 2017-07-18 (×3): 40 mg via ORAL
  Filled 2017-07-14 (×4): qty 1

## 2017-07-14 NOTE — Progress Notes (Signed)
The order for simvastatin(Zocor) was changed to an equivalent dose of atorvastatin(Lipitor) due to the potential drug interaction with Norvasc.  When taken in combination with medications that inhibit its metabolism, simvastatin can accumulate which increases the risk of liver toxicity, myopathy, or rhabdomyolysis.  Simvastatin dose should not exceed /day in patients taking verapamil, diltiazem, fibrates, or niacin >or= 1g/day.   Simvastatin dose should not exceed /day in patients taking amlodipine, ranolazine or amiodarone.   Please consider this potential interaction at discharge.  Lorenza Evangelist 07/14/2017 10:38 PM

## 2017-07-14 NOTE — H&P (Signed)
History and Physical   [facility] Admission History and Physical [employer], D.O.    Patient Name: [name] MR#: [id number] Date of Birth: [birth year] Date of Admission: 07/14/2017  Referring MD/NP/PA: Dr.Steinl Primary Care Physician: Sheliah Hatch, MD  Chief Complaint:  Chief Complaint  Patient presents with  . Fever  . Weakness  Please note the entire history is obtained from the patient's emergency department chart, emergency department provider and the patient's family who is at the bedside. Patient's personal history is limited by weakness.   HPI: [name] is a 84 y.o. female with a known history of afib on Coumadin, CHD, CKD3, DM2, HTN, GERD, HLD, ?CHF presents to the emergency department for evaluation of fever.  Patient was in a usual state of health until several days ago when her daughter describes the onset of weakness, decreased PO intake, fever to 100.9 and scant cough.  She also describes SOB with O2 sats in the high 70s this morning.  Questionable history of CHF. Has not seen a cardiologist.   Last INR check was 3 (06/24/17) and patient had been holding coumadin because of this. Daughter also reports several episodes of dark stools this week, but no other signs of bleeding. She does have chronic diarrhea.  Patient has never had a colonoscopy.    At baseline patient mobilizes with a walker at home but over the past week she has been using a wheelchair because of weakness and SOB.     Otherwise there has been no change in status. Patient has been taking medication as prescribed and there has been no recent change in medication or diet.  No recent antibiotics.  There has been no recent illness, hospitalizations, travel or sick contacts.    EMS/ED Course: Patient received Rocephin/Azithro, NS. Medical admission has been requested for further management of UTI, CAP, supratherapeutic INR.  Review of Systems:   CONSTITUTIONAL: Positive fever/chills, fatigue, weakness, decreasedPO intake.Negative weight gain/loss, headache. EYES: No blurry or double vision. ENT: No tinnitus, postnasal drip, redness or soreness of the oropharynx. RESPIRATORY: Positive  cough, negative dyspnea, wheeze.  No hemoptysis.  CARDIOVASCULAR: No chest pain, palpitations, syncope, orthopnea. No lower extremity edema.  GASTROINTESTINAL: Positive chronic diarrhea and dark stools.  No nausea, vomiting, abdominal pain, constipation.  No hematemesis, melena or hematochezia. GENITOURINARY: No dysuria, frequency, hematuria. ENDOCRINE: No polyuria or nocturia. No heat or cold intolerance. HEMATOLOGY: No anemia, bruising, bleeding. INTEGUMENTARY: No rashes, ulcers, lesions. MUSCULOSKELETAL: No arthritis, gout, dyspnea. NEUROLOGIC: No numbness, tingling, ataxia, seizure-type activity, weakness. PSYCHIATRIC: No anxiety, depression, insomnia.   Past Medical History:  Diagnosis Date  . Atrial fibrillation (HCC)   . CHF (congestive heart failure) (HCC)   . CKD (chronic kidney disease)    stage 3  . DIABETES-TYPE 2    12/12/15 dgtr unsure  . DYSPNEA ON EXERTION   . Edema   . GERD   . HYPERTENSION   . Intrinsic asthma, unspecified   . KNEE PAIN   . OBESITY   . Other and unspecified hyperlipidemia     Past Surgical History:  Procedure Laterality Date  . ABDOMINAL HYSTERECTOMY    . I&D EXTREMITY Right 03/13/2015   Procedure: IRRIGATION AND DEBRIDEMENT RIGHT WRIST;  Surgeon: Knute Neu, MD;  Location: [facility];  Service: Plastics;  Laterality: Right;  . KNEE SURGERY Left    arthroscopic     reports that she has never smoked. She has never used smokeless tobacco. She reports  that she does not drink alcohol or use drugs.  No Known Allergies  Family History  Problem Relation Age of Onset  . Coronary artery disease Mother   . Hypertension Mother   . Stroke Mother   . Heart attack Mother   . Hypertension Father   .  Prostate cancer Father   . Stroke Father   . Heart attack Father   . Cancer Brother        throat    Prior to Admission medications   Medication Sig Start Date End Date Taking? Authorizing Provider  acidophilus (RISAQUAD) CAPS capsule Take 1 capsule by mouth daily.   Yes [provider]  albuterol (VENTOLIN HFA) 108 (90 Base) MCG/ACT inhaler INHALE TWO PUFFS EVERY 4 HOURS AS NEEDED FOR COUGH AND  WHEEZING 01/16/16  Yes Waldon Merl, PA-C  allopurinol (ZYLOPRIM) 100 MG tablet TAKE 1 TABLET BY MOUTH ONCE DAILY 04/15/17  Yes Sheliah Hatch, MD  amLODipine (NORVASC) 10 MG tablet Take 10 mg by mouth at bedtime.  06/28/17  Yes [provider]  cetirizine (ZYRTEC) 10 MG tablet Take 10 mg by mouth daily.   Yes [provider]  fluticasone (FLONASE) 50 MCG/ACT nasal spray Place 2 sprays into both nostrils daily. 01/16/16  Yes Waldon Merl, PA-C  furosemide (LASIX) 40 MG tablet Take 1 tablet (40 mg total) by mouth daily. 03/22/15  Yes Sheliah Hatch, MD  guaiFENesin (ROBITUSSIN) 100 MG/5ML SOLN Take 5-10 mLs by mouth every 4 (four) hours as needed for cough or to loosen phlegm.   Yes [provider]  hydrALAZINE (APRESOLINE) 100 MG tablet Take 1 tablet (100 mg total) by mouth 3 (three) times daily. 05/06/17  Yes Sheliah Hatch, MD  levothyroxine (SYNTHROID, LEVOTHROID) 50 MCG tablet TAKE 1 TABLET BY MOUTH ONCE DAILY 06/28/17  Yes Sheliah Hatch, MD  Multiple Vitamins-Minerals (MULTIVITAMIN ADULTS 50+ PO) Take 1 tablet by mouth daily.    Yes [provider]  simvastatin (ZOCOR) 40 MG tablet TAKE ONE TABLET BY MOUTH AT BEDTIME Patient taking differently: TAKE HALF TABLET BY MOUTH AT BEDTIME 02/12/17  Yes Sheliah Hatch, MD  warfarin (COUMADIN) 3 MG tablet TAKE TWO TABLETS BY MOUTH ONCE DAILY Patient taking differently: Take 1.5 tablets by mouth on Monday and Friday, take 1 tablet by mouth all other days 03/04/17  Yes Sheliah Hatch, MD  colchicine 0.6 MG tablet 2 tabs x1 dose and then 1 tab 1 hr later.  Repeat in 3 days if needed. Patient not taking: Reported on 07/14/2017 06/28/15   Sheliah Hatch, MD    Physical Exam: Vitals:   07/14/17 1515 07/14/17 1600 07/14/17 1738 07/14/17 1800  BP: (!) 158/46 (!) 156/44 (!) 153/43 (!) 153/43  Pulse: 60  60 (!) 57  Resp: (!) 21 (!) 25 19 (!) 21  Temp:      TempSrc:      SpO2: 97% 96% 94% 96%  Weight:      Height:        GENERAL: 84 y.o.-year-old female patient, well-developed, well-nourished lying in the bed in no acute distress.  Generalized weakness.  HEENT: Head atraumatic, normocephalic. Pupils equal. Mucus membranes moist. NECK: Supple. No JVD. CHEST: Normal breath sounds bilaterally. No wheezing, rales, rhonchi or crackles. No use of accessory muscles of respiration.  No reproducible chest wall tenderness.  CARDIOVASCULAR: S1, S2 normal. No murmurs, rubs, or gallops. Cap refill <2 seconds. Pulses intact distally.  ABDOMEN: Mild suprapubic tenderness.  Soft, nondistended, nontender. No rebound, guarding, rigidity. Normoactive bowel sounds present in all four quadrants.  EXTREMITIES: No pedal edema, cyanosis, or clubbing. No calf tenderness or Homan's sign.  NEUROLOGIC: The patient is awake and alert, oriented to person and place.Marland Kitchen Responds to questions and follows commands.  Moves all extremities. No focal sensorimotor deficit. SKIN: Warm, dry, and intact without obvious rash, lesion, or ulcer.    Labs on Admission:  CBC: Recent Labs  Lab 07/14/17 1442  WBC 10.2  HGB 9.5*  HCT 29.0*  MCV 90.9  PLT 221   Basic Metabolic Panel: Recent Labs  Lab 07/14/17 1442  NA 138  K 3.4*  CL 103  CO2 22  GLUCOSE 154*  BUN 86*  CREATININE 1.87*  CALCIUM 8.8*   GFR: Estimated Creatinine Clearance: 19 mL/min (A) (by C-G formula based on SCr of 1.87 mg/dL (H)). Liver Function Tests: Recent Labs  Lab 07/14/17 1442  AST 242*  ALT 106*  ALKPHOS 84   BILITOT 0.8  PROT 7.3  ALBUMIN 3.2*   No results for input(s): LIPASE, AMYLASE in the last 168 hours. No results for input(s): AMMONIA in the last 168 hours. Coagulation Profile: Recent Labs  Lab 07/14/17 1638  INR 6.82*   Cardiac Enzymes: No results for input(s): CKTOTAL, CKMB, CKMBINDEX, TROPONINI in the last 168 hours. BNP (last 3 results) No results for input(s): PROBNP in the last 8760 hours. HbA1C: No results for input(s): HGBA1C in the last 72 hours. CBG: Recent Labs  Lab 07/14/17 1431  GLUCAP 182*   Lipid Profile: No results for input(s): CHOL, HDL, LDLCALC, TRIG, CHOLHDL, LDLDIRECT in the last 72 hours. Thyroid Function Tests: No results for input(s): TSH, T4TOTAL, FREET4, T3FREE, THYROIDAB in the last 72 hours. Anemia Panel: No results for input(s): VITAMINB12, FOLATE, FERRITIN, TIBC, IRON, RETICCTPCT in the last 72 hours. Urine analysis:    Component Value Date/Time   COLORURINE YELLOW 07/14/2017 1638   APPEARANCEUR CLOUDY (A) 07/14/2017 1638   LABSPEC 1.011 07/14/2017 1638   PHURINE 5.0 07/14/2017 1638   GLUCOSEU NEGATIVE 07/14/2017 1638   HGBUR NEGATIVE 07/14/2017 1638   BILIRUBINUR NEGATIVE 07/14/2017 1638   KETONESUR NEGATIVE 07/14/2017 1638   PROTEINUR NEGATIVE 07/14/2017 1638   UROBILINOGEN 0.2 12/15/2010 1619   NITRITE POSITIVE (A) 07/14/2017 1638   LEUKOCYTESUR LARGE (A) 07/14/2017 1638   Sepsis Labs: (procalcitonin:4,lacticidven:4) ) Recent Results (from the past 240 hour(s))  Blood Culture (routine x 2)     Status: None (Preliminary result)   Collection Time: 07/14/17  3:23 PM  Result Value Ref Range Status   Specimen Description   Final    BLOOD LEFT FOREARM Performed at [facility], [address], [place], [place] [address]    Special Requests   Final    BOTTLES DRAWN AEROBIC AND ANAEROBIC Blood Culture results may not be optimal due to an excessive volume of blood received in culture bottles Performed at [facility], [address] [place], [place] [address]    Culture PENDING  Incomplete   Report Status PENDING  Incomplete     Radiological Exams on Admission: Ct Abdomen Pelvis Wo Contrast  Result Date: 07/14/2017 CLINICAL DATA:  Cough, congestion and fatigue for 6 days. History of hysterectomy. EXAM: CT ABDOMEN AND PELVIS WITHOUT CONTRAST TECHNIQUE: Multidetector CT imaging of the abdomen and pelvis was performed following the standard protocol without IV contrast. COMPARISON:  None. FINDINGS: LOWER CHEST: Small bilateral pleural effusions. Included heart is enlarged. Trace pericardial  effusion. HEPATOBILIARY: Distended gallbladder with 2.6 cm gallstone. Negative noncontrast CT liver. PANCREAS: Normal. SPLEEN: Normal. ADRENALS/URINARY TRACT: Kidneys are orthotopic, demonstrating normal size and morphology. No nephrolithiasis, hydronephrosis; limited assessment for renal masses on this nonenhanced examination. RIGHT pelviectasis. The unopacified ureters are normal in course and caliber. Urinary bladder is partially distended and unremarkable. Normal adrenal glands. STOMACH/BOWEL: The stomach, small and large bowel are normal in course and caliber. Severe descending and sigmoid colonic diverticulosis. Ascending and transverse colon air contrast levels. Normal appendix. VASCULAR/LYMPHATIC: Aortoiliac vessels are normal in course and caliber. Moderate to severe calcific atherosclerosis. No lymphadenopathy by CT size criteria. REPRODUCTIVE: Status post hysterectomy. OTHER: No intraperitoneal free fluid or free air. MUSCULOSKELETAL: Non-acute. Status post anterior abdominal wall herniorrhaphy. Moderate sacroiliac osteoarthrosis. Severe lower lumbar facet arthropathy. Grade 1 L4-5 anterolisthesis without spondylolysis. Moderate to severe L4-5 neural foraminal narrowing. IMPRESSION: 1. Cholelithiasis and gallbladder distension without additional findings of acute cholecystitis. 2. Colonic air  contrast levels, possible enteritis. Colonic diverticulosis. 3. Cardiomegaly and small pleural effusions. Aortic Atherosclerosis (ICD10-I70.0). Electronically Signed   By: Awilda Metro M.D.   On: 07/14/2017 17:39   Dg Chest 2 View  Result Date: 07/14/2017 CLINICAL DATA:  Cough and congestion with fatigue 6 days. EXAM: CHEST - 2 VIEW COMPARISON:  08/19/2009 FINDINGS: Lungs are adequately inflated with mild prominence of the perihilar markings with middle patchy density in the left perihilar region and left base. Small left pleural effusion. Moderate cardiomegaly. Degenerative change of the spine. IMPRESSION: Findings suggesting mild CHF. Possible infection over the left mid to lower lung. Electronically Signed   By: Elberta Fortis M.D.   On: 07/14/2017 15:18    EKG: Afib at 59 bpm with normal axis and nonspecific ST-T wave changes, borderline prolonged QT interval..   ECHO: 11/28/11  Patient:  Devani, Odonnel MR #:    [id number] Study Date: 11/28/2011 Gender:   F Age:    82 Height:   152.4cm Weight:   88.5kg BSA:    1.43m^2 Pt. Status: Room:  ATTENDING  Vincente Poli REFERRING  Lewayne Bunting SONOGRAPHER Aida Raider, RDCS PERFORMING  [facility]ainer, Site 3 cc:  ------------------------------------------------------------ LV EF: 60% -  65%  ------------------------------------------------------------ Indications:   Dyspnea 786.09.  ------------------------------------------------------------ History:  PMH: Acquired from the patient and from the patient's chart. PMH: Carotid Bruit. History of Acute Renal Failure. Bradycardia, Dyspnea. Edema. Atrial Fibrillation. Risk factors: Hypertension. Dyslipidemia.  ------------------------------------------------------------ Study Conclusions  - Left ventricle: The cavity size was normal. Wall thickness was increased in a pattern of moderate LVH.  Systolic function was normal. The estimated ejection fraction was in the range of 60% to 65%. Wall motion was normal; there were no regional wall motion abnormalities. Doppler parameters are consistent with abnormal left ventricular relaxation (grade 1 diastolic dysfunction). - Mitral valve: Mild regurgitation. - Atrial septum: No defect or patent foramen ovale was identified.  Assessment/Plan  This is a 84 y.o. female with a history of afib on Coumadin, CKD3, DM2, HTN, GERD, HLD, grade 1 diastolic CHF now being admitted with:  #. Community Acquired Pneumonia and UTI - Admit to inpatient - IV Rocephin & Azithromycin per pharmacy - Gentle IV fluid hydration - Duonebs, expectorants & O2 therapy as needed - Follow up blood, urine and sputum cultures  #. GI Bleed with supratherapeutic INR, normocytic anemia - IV Protonix  bolus followed by /hr - Serial CBCs, tranfuse as needed - Nothing by mouth - IV fluid hydration - Hold  anticoagulants - Vitamin K 2.5mg  PO - Check iron, B12, folate - GI consultation  #. Acute kidney injury  - IV fluids and repeat BMP in AM.  - Avoid nephrotoxic medications - Bladder scan and place foley catheter if evidence of urinary retention  #. Diarrhea, possible enteritis - Check stool cultures  #. Hypoxia, h/o grade 1 diastolic CHF - Check echo - Continue Lasix  #. History of HTN - Continue amlodipine, hydralazine  #. History of HLD - Continue Zocor  #. History of hypothyroidism - Continue Sythroid  #. History of gout - Continue allopurinol  #. H/o Diabetes - Accuchecks every 4hours with RISS coverage - NPO   Admission status: Inpatient IV Fluids: NS Diet/Nutrition: NPO Consults called: GI  DVT Px: SCDs and early ambulation. Code Status: Full Code  Disposition Plan: To home in 2-3 days  All the records are reviewed and case discussed with ED provider. Management plans discussed with the patient and/or family  who express understanding and agree with plan of care.  Michaiah Maiden D.O. on 07/14/2017 at 6:51 PM CC: Primary care physician; Sheliah Hatch, MD   07/14/2017, 6:51 PM

## 2017-07-14 NOTE — ED Notes (Signed)
1st set of blood cultures collected at 14:49.

## 2017-07-14 NOTE — ED Provider Notes (Addendum)
[facility]-EMERGENCY DEPT Provider Note   CSN: [id number] Arrival date & time: 07/14/17  1413     History   Chief Complaint Chief Complaint  Patient presents with  . Fever  . Weakness    HPI [name] is a 84 y.o. female.  Patient with fever to 100.9, occasional cough, generalized weakness, poor po intake x several days. Pt very poorly responsive to questions - level 5 caveat. Symptoms present x past several days, slowly worse.   The history is provided by the patient and a relative. The history is limited by the condition of the patient.  Cough     Past Medical History:  Diagnosis Date  . Atrial fibrillation (HCC)   . CHF (congestive heart failure) (HCC)   . CKD (chronic kidney disease)    stage 3  . DIABETES-TYPE 2    12/12/15 dgtr unsure  . DYSPNEA ON EXERTION   . Edema   . GERD   . HYPERTENSION   . Intrinsic asthma, unspecified   . KNEE PAIN   . OBESITY   . Other and unspecified hyperlipidemia     Patient Active Problem List   Diagnosis Date Noted  . Chronic diarrhea 05/06/2017  . Dementia 05/06/2017  . Long term (current) use of anticoagulants 12/07/2016  . Encounter for therapeutic drug monitoring 06/01/2015  . Gout of right wrist 04/12/2015  . Chronic diastolic CHF (congestive heart failure), NYHA class 1 (HCC) 03/12/2015  . Tremor of both hands 01/14/2014  . BRBPR (bright red blood per rectum) 01/14/2014  . Asthma with acute exacerbation 12/10/2012  . Osteopenia 08/16/2011  . General medical examination 08/16/2011  . Bronchitis 03/08/2011  . Hyperlipidemia 02/21/2011  . Anemia 12/29/2010  . Abnormal LFTs 12/29/2010  . Current use of long term anticoagulation 12/29/2010  . Bradycardia 09/09/2010  . Atrial fibrillation (HCC) 04/26/2010  . History of diet-controlled diabetes 11/09/2009  . CAROTID BRUIT, LEFT 11/09/2009  . INTRINSIC ASTHMA, UNSPECIFIED 09/27/2009  . DYSPNEA ON EXERTION 08/19/2009  . KNEE PAIN 07/20/2009   . EDEMA 07/20/2009  . OBESITY 12/07/2008  . Essential hypertension 03/24/2008  . GERD 03/24/2008    Past Surgical History:  Procedure Laterality Date  . ABDOMINAL HYSTERECTOMY    . I&D EXTREMITY Right 03/13/2015   Procedure: IRRIGATION AND DEBRIDEMENT RIGHT WRIST;  Surgeon: Knute Neu, MD;  Location: [facility];  Service: Plastics;  Laterality: Right;  . KNEE SURGERY Left    arthroscopic     OB History   None      Home Medications    Prior to Admission medications   Medication Sig Start Date End Date Taking? Authorizing Provider  albuterol (VENTOLIN HFA) 108 (90 Base) MCG/ACT inhaler INHALE TWO PUFFS EVERY 4 HOURS AS NEEDED FOR COUGH AND  WHEEZING 01/16/16   Waldon Merl, PA-C  allopurinol (ZYLOPRIM) 100 MG tablet TAKE 1 TABLET BY MOUTH ONCE DAILY 04/15/17   Sheliah Hatch, MD  amLODipine (NORVASC) 5 MG tablet Take 5 mg by mouth at bedtime.     [provider]  cetirizine (ZYRTEC) 10 MG tablet Take 10 mg by mouth daily.    [provider]  colchicine 0.6 MG tablet 2 tabs x1 dose and then 1 tab 1 hr later.  Repeat in 3 days if needed. 06/28/15   Sheliah Hatch, MD  fluticasone (FLONASE) 50 MCG/ACT nasal spray Place 2 sprays into both nostrils daily. 01/16/16   Waldon Merl, PA-C  furosemide (LASIX) 40 MG  tablet Take 1 tablet (40 mg total) by mouth daily. Patient taking differently: Take 20 mg by mouth daily.  03/22/15   Sheliah Hatch, MD  hydrALAZINE (APRESOLINE) 100 MG tablet Take 1 tablet (100 mg total) by mouth 3 (three) times daily. 05/06/17   Sheliah Hatch, MD  levothyroxine (SYNTHROID, LEVOTHROID) 50 MCG tablet TAKE 1 TABLET BY MOUTH ONCE DAILY 06/28/17   Sheliah Hatch, MD  Multiple Vitamins-Minerals (MULTIVITAMIN ADULTS 50+ PO) Take by mouth.    [provider]  simvastatin (ZOCOR) 40 MG tablet TAKE ONE TABLET BY MOUTH AT BEDTIME 02/12/17   Sheliah Hatch, MD  warfarin (COUMADIN) 3 MG tablet TAKE TWO TABLETS BY  MOUTH ONCE DAILY 03/04/17   Sheliah Hatch, MD    Family History Family History  Problem Relation Age of Onset  . Coronary artery disease Mother   . Hypertension Mother   . Stroke Mother   . Heart attack Mother   . Hypertension Father   . Prostate cancer Father   . Stroke Father   . Heart attack Father   . Cancer Brother        throat    Social History Social History   Tobacco Use  . Smoking status: Never Smoker  . Smokeless tobacco: Never Used  Substance Use Topics  . Alcohol use: No  . Drug use: No     Allergies   Patient has no known allergies.   Review of Systems Review of Systems  Unable to perform ROS: Mental status change  Respiratory: Positive for cough.   level 5 caveat - pt poorly responsive to questions    Physical Exam Updated Vital Signs BP (!) 158/46 (BP Location: Right Arm)   Pulse 60   Temp 100.2 F (37.9 C) (Rectal)   Resp (!) 21   Ht 1.499 m ( )   Wt 74.8 kg (165 lb)   SpO2 97%   BMI 33.33 kg/m   Physical Exam  Constitutional: She appears well-developed and well-nourished. No distress.  HENT:  Head: Atraumatic.  Mouth/Throat: Oropharynx is clear and moist.  Eyes: Pupils are equal, round, and reactive to light. Conjunctivae are normal. No scleral icterus.  Neck: Neck supple. No tracheal deviation present.  No stiffness or rigidity  Cardiovascular: Normal rate, regular rhythm, normal heart sounds and intact distal pulses. Exam reveals no gallop and no friction rub.  No murmur heard. Pulmonary/Chest: Effort normal and breath sounds normal. No respiratory distress.  Abdominal: Soft. Normal appearance and bowel sounds are normal. She exhibits distension. There is tenderness.  Mild abd distension. Diffuse tenderness.  Genitourinary:  Genitourinary Comments: No cva tenderness  Musculoskeletal: She exhibits no edema.  Neurological: She is alert.  Moves bil extremities purposefully. Responds occasionally yes/no to questions.     Skin: Skin is warm and dry. No rash noted. She is not diaphoretic.  Psychiatric: She has a normal mood and affect.  Nursing note and vitals reviewed.    ED Treatments / Results  Labs (all labs ordered are listed, but only abnormal results are displayed) Results for orders placed or performed during the hospital encounter of 07/14/17  Basic metabolic panel  Result Value Ref Range   Sodium 138 135 - 145 mmol/L   Potassium 3.4 (L) 3.5 - 5.1 mmol/L   Chloride 103 101 - 111 mmol/L   CO2 22 22 - 32 mmol/L   Glucose, Bld 154 (H) 65 - 99 mg/dL   BUN 86 (H) 6 - 20  mg/dL   Creatinine, Ser 1.61 (H) 0.44 - 1.00 mg/dL   Calcium 8.8 (L) 8.9 - 10.3 mg/dL   GFR calc non Af Amer 23 (L) >60 mL/min   GFR calc Af Amer 27 (L) >60 mL/min   Anion gap 13 5 - 15  CBC  Result Value Ref Range   WBC 10.2 4.0 - 10.5 K/uL   RBC 3.19 (L) 3.87 - 5.11 MIL/uL   Hemoglobin 9.5 (L) 12.0 - 15.0 g/dL   HCT 09.6 (L) 04.5 - 40.9 %   MCV 90.9 78.0 - 100.0 fL   MCH 29.8 26.0 - 34.0 pg   MCHC 32.8 30.0 - 36.0 g/dL   RDW 81.1 (H) 91.4 - 78.2 %   Platelets 221 150 - 400 K/uL  Urinalysis, Routine w reflex microscopic  Result Value Ref Range   Color, Urine YELLOW YELLOW   APPearance CLOUDY (A) CLEAR   Specific Gravity, Urine 1.011 1.005 - 1.030   pH 5.0 5.0 - 8.0   Glucose, UA NEGATIVE NEGATIVE mg/dL   Hgb urine dipstick NEGATIVE NEGATIVE   Bilirubin Urine NEGATIVE NEGATIVE   Ketones, ur NEGATIVE NEGATIVE mg/dL   Protein, ur NEGATIVE NEGATIVE mg/dL   Nitrite POSITIVE (A) NEGATIVE   Leukocytes, UA LARGE (A) NEGATIVE   RBC / HPF 0-5 0 - 5 RBC/hpf   WBC, UA >50 (H) 0 - 5 WBC/hpf   Bacteria, UA MANY (A) NONE SEEN   Squamous Epithelial / LPF 0-5 0 - 5   WBC Clumps PRESENT   Protime-INR  Result Value Ref Range   Prothrombin Time 58.7 (H) 11.4 - 15.2 seconds   INR 6.82 (HH)   CBG monitoring, ED  Result Value Ref Range   Glucose-Capillary 182 (H) 65 - 99 mg/dL  I-Stat CG4 Lactic Acid, ED  Result Value Ref  Range   Lactic Acid, Venous 2.36 (HH) 0.5 - 1.9 mmol/L   Comment NOTIFIED PHYSICIAN   I-Stat CG4 Lactic Acid, ED  Result Value Ref Range   Lactic Acid, Venous 1.00 0.5 - 1.9 mmol/L   Dg Chest 2 View  Result Date: 07/14/2017 CLINICAL DATA:  Cough and congestion with fatigue 6 days. EXAM: CHEST - 2 VIEW COMPARISON:  08/19/2009 FINDINGS: Lungs are adequately inflated with mild prominence of the perihilar markings with middle patchy density in the left perihilar region and left base. Small left pleural effusion. Moderate cardiomegaly. Degenerative change of the spine. IMPRESSION: Findings suggesting mild CHF. Possible infection over the left mid to lower lung. Electronically Signed   By: Elberta Fortis M.D.   On: 07/14/2017 15:18    EKG EKG Interpretation  Date/Time:  Sunday Jul 14 2017 14:25:31 EDT Ventricular Rate:  59 PR Interval:    QRS Duration: 89 QT Interval:  493 QTC Calculation: 489 R Axis:   117 Text Interpretation:  Atrial fibrillation Low voltage, precordial leads Nonspecific T abnormalities, lateral leads Borderline prolonged QT interval Confirmed by Cathren Laine ([id number]) on 07/14/2017 3:21:33 PM   Radiology Dg Chest 2 View  Result Date: 07/14/2017 CLINICAL DATA:  Cough and congestion with fatigue 6 days. EXAM: CHEST - 2 VIEW COMPARISON:  08/19/2009 FINDINGS: Lungs are adequately inflated with mild prominence of the perihilar markings with middle patchy density in the left perihilar region and left base. Small left pleural effusion. Moderate cardiomegaly. Degenerative change of the spine. IMPRESSION: Findings suggesting mild CHF. Possible infection over the left mid to lower lung. Electronically Signed   By: Elberta Fortis M.D.   On:  07/14/2017 15:18    Procedures Procedures (including critical care time)  Medications Ordered in ED Medications  sodium chloride 0.9 % bolus 500 mL (has no administration in time range)     Initial Impression / Assessment and Plan / ED  Course  I have reviewed the triage vital signs and the nursing notes.  Pertinent labs & imaging results that were available during my care of the patient were reviewed by me and considered in my medical decision making (see chart for details).  Iv ns. Labs. Imaging studies.  Reviewed nursing notes and prior charts for additional history.   Iv ns bolus.   Repeat lactate improved.   Labs reviewed - ua positive for infection. u cx sent. Rocephin iv.  cxr reviewed - possible left pna, given cough, fever, will also tx with zithromax iv.   Ct with gallstones, no acute infection noted.   Coagulopathic on coumadin - will hold coumadin. No current report of bleeding. Stool is dark brown, sent for hemoccult.   Hospitalists consulted for admission.   Final Clinical Impressions(s) / ED Diagnoses   Final diagnoses:  None    ED Discharge Orders    None            Cathren Laine, MD 07/14/17 1836

## 2017-07-14 NOTE — ED Notes (Addendum)
CRITICAL VALUE STICKER  CRITICAL VALUE: INR of 6.82  RECEIVER (on-site recipient of call):  DATE & TIME NOTIFIED: 07/14/17 and 18:19  MESSENGER (representative from lab):  MD NOTIFIED: Steinl  TIME OF NOTIFICATION: 18:19  RESPONSE: Waiting for orders

## 2017-07-14 NOTE — ED Triage Notes (Addendum)
Patient BIB daughter, reports patient has had cough and congestion with increased fatigue x6 days. Denies N/V/D. Denies chest pain and SOB.

## 2017-07-14 NOTE — ED Notes (Signed)
Informed by [facility] that they are waiting for a RN to come in and take assignment.  Still waiting for bed to be ready.  Patient and family made aware of delay.

## 2017-07-14 NOTE — ED Notes (Signed)
Patient transported to X-ray 

## 2017-07-14 NOTE — ED Notes (Signed)
ED TO INPATIENT HANDOFF REPORT  Name/Age/Gender [name] 84 y.o. female  Code Status    Code Status Orders  (From admission, onward)        Start     Ordered   07/14/17 2208  Full code  Continuous     07/14/17 2208    Code Status History    Date Active Date Inactive Code Status Order ID Comments User Context   03/12/2015 2000 03/16/2015 0149 Full Code [id number]  Toy Baker, MD Inpatient    Advance Directive Documentation     Most Recent Value  Type of Advance Directive  Healthcare Power of Attorney, Living will  Pre-existing out of facility DNR order (yellow form or pink MOST form)  -  "MOST" Form in Place?  -      Home/SNF/Other Home  Chief Complaint low oxygen level/congestion/weakness  Level of Care/Admitting Diagnosis ED Disposition    ED Disposition Condition [place]: [facility] [[id number]]  Level of Care: Med-Surg [16]  Diagnosis: Community acquired pneumonia [[id number]]  Admitting Physician: Harvie Bridge [[id number]]  Attending Physician: Sherron Monday  Estimated length of stay: past midnight tomorrow  Certification:: I certify this patient will need inpatient services for at least 2 midnights  PT Class (Do Not Modify): Inpatient [101]  PT Acc Code (Do Not Modify): Private [1]       Medical History Past Medical History:  Diagnosis Date  . Atrial fibrillation ([place])   . CHF (congestive heart failure) ([place])   . CKD (chronic kidney disease)    stage 3  . DIABETES-TYPE 2    12/12/15 dgtr unsure  . DYSPNEA ON EXERTION   . Edema   . GERD   . HYPERTENSION   . Intrinsic asthma, unspecified   . KNEE PAIN   . OBESITY   . Other and unspecified hyperlipidemia     Allergies No Known Allergies  IV Location/Drains/Wounds Patient Lines/Drains/Airways Status   Active Line/Drains/Airways    Name:   Placement date:   Placement time:   Site:   Days:   Peripheral IV 07/14/17 Left Forearm    07/14/17    1454    Forearm   less than 1          Labs/Imaging Results for orders placed or performed during the hospital encounter of 07/14/17 (from the past 48 hour(s))  CBG monitoring, ED     Status: Abnormal   Collection Time: 07/14/17  2:31 PM  Result Value Ref Range   Glucose-Capillary 182 (H) 65 - 99 mg/dL  Basic metabolic panel     Status: Abnormal   Collection Time: 07/14/17  2:42 PM  Result Value Ref Range   Sodium 138 135 - 145 mmol/L   Potassium 3.4 (L) 3.5 - 5.1 mmol/L   Chloride 103 101 - 111 mmol/L   CO2 22 22 - 32 mmol/L   Glucose, Bld 154 (H) 65 - 99 mg/dL   BUN 86 (H) 6 - 20 mg/dL   Creatinine, Ser 1.87 (H) 0.44 - 1.00 mg/dL   Calcium 8.8 (L) 8.9 - 10.3 mg/dL   GFR calc non Af Amer 23 (L) >60 mL/min   GFR calc Af Amer 27 (L) >60 mL/min    Comment: (NOTE) The eGFR has been calculated using the CKD EPI equation. This calculation has not been validated in all clinical situations. eGFR's persistently <60 mL/min signify possible Chronic Kidney Disease.    Anion gap 13 5 -  15    Comment: Performed at [facility], [place] [address] [place], [place] [address]  CBC     Status: Abnormal   Collection Time: 07/14/17  2:42 PM  Result Value Ref Range   WBC 10.2 4.0 - 10.5 K/uL   RBC 3.19 (L) 3.87 - 5.11 MIL/uL   Hemoglobin 9.5 (L) 12.0 - 15.0 g/dL   HCT 29.0 (L) 36.0 - 46.0 %   MCV 90.9 78.0 - 100.0 fL   MCH 29.8 26.0 - 34.0 pg   MCHC 32.8 30.0 - 36.0 g/dL   RDW 16.7 (H) 11.5 - 15.5 %   Platelets 221 150 - 400 K/uL    Comment: Performed at [facility], [place] [address], [place], [place] [address]  Hepatic function panel     Status: Abnormal   Collection Time: 07/14/17  2:42 PM  Result Value Ref Range   Total Protein 7.3 6.5 - 8.1 g/dL   Albumin 3.2 (L) 3.5 - 5.0 g/dL   AST 242 (H) 15 - 41 U/L   ALT 106 (H) 14 - 54 U/L   Alkaline Phosphatase 84 38 - 126 U/L   Total Bilirubin 0.8 0.3 - 1.2 mg/dL   Bilirubin, Direct 0.1  0.1 - 0.5 mg/dL   Indirect Bilirubin 0.7 0.3 - 0.9 mg/dL    Comment: Performed at [facility], [place] [address], [place], [place] [address]  I-Stat CG4 Lactic Acid, ED     Status: Abnormal   Collection Time: 07/14/17  2:56 PM  Result Value Ref Range   Lactic Acid, Venous 2.36 (HH) 0.5 - 1.9 mmol/L   Comment NOTIFIED PHYSICIAN   Blood Culture (routine x 2)     Status: None (Preliminary result)   Collection Time: 07/14/17  3:23 PM  Result Value Ref Range   Specimen Description      BLOOD LEFT FOREARM Performed at [facility], [place] [address] [place], [place] [address]    Special Requests      BOTTLES DRAWN AEROBIC AND ANAEROBIC Blood Culture results may not be optimal due to an excessive volume of blood received in culture bottles Performed at [place] [address], [place], [place] [address]    Culture PENDING    Report Status PENDING   Urinalysis, Routine w reflex microscopic     Status: Abnormal   Collection Time: 07/14/17  4:38 PM  Result Value Ref Range   Color, Urine YELLOW YELLOW   APPearance CLOUDY (A) CLEAR   Specific Gravity, Urine 1.011 1.005 - 1.030   pH 5.0 5.0 - 8.0   Glucose, UA NEGATIVE NEGATIVE mg/dL   Hgb urine dipstick NEGATIVE NEGATIVE   Bilirubin Urine NEGATIVE NEGATIVE   Ketones, ur NEGATIVE NEGATIVE mg/dL   Protein, ur NEGATIVE NEGATIVE mg/dL   Nitrite POSITIVE (A) NEGATIVE   Leukocytes, UA LARGE (A) NEGATIVE   RBC / HPF 0-5 0 - 5 RBC/hpf   WBC, UA >50 (H) 0 - 5 WBC/hpf   Bacteria, UA MANY (A) NONE SEEN   Squamous Epithelial / LPF 0-5 0 - 5   WBC Clumps PRESENT     Comment: Performed at [facility], [place] [address] [place], [place] [address]  Protime-INR     Status: Abnormal   Collection Time: 07/14/17  4:38 PM  Result Value Ref Range   Prothrombin Time 58.7 (H) 11.4 - 15.2 seconds   INR 6.82 (HH)     Comment: REPEATED  TO VERIFY CRITICAL RESULT CALLED TO, READ BACK BY AND  VERIFIED WITH: ROYCE 07/14/17 BLACK,M 13:16 Performed at [facility], [place] [address] [place], [place] [address]   I-Stat CG4 Lactic Acid, ED     Status: None   Collection Time: 07/14/17  4:39 PM  Result Value Ref Range   Lactic Acid, Venous 1.00 0.5 - 1.9 mmol/L  POC occult blood, ED Provider will collect     Status: Abnormal   Collection Time: 07/14/17  7:04 PM  Result Value Ref Range   Fecal Occult Bld POSITIVE (A) NEGATIVE   Ct Abdomen Pelvis Wo Contrast  Result Date: 07/14/2017 CLINICAL DATA:  Cough, congestion and fatigue for 6 days. History of hysterectomy. EXAM: CT ABDOMEN AND PELVIS WITHOUT CONTRAST TECHNIQUE: Multidetector CT imaging of the abdomen and pelvis was performed following the standard protocol without IV contrast. COMPARISON:  None. FINDINGS: LOWER CHEST: Small bilateral pleural effusions. Included heart is enlarged. Trace pericardial effusion. HEPATOBILIARY: Distended gallbladder with 2.6 cm gallstone. Negative noncontrast CT liver. PANCREAS: Normal. SPLEEN: Normal. ADRENALS/URINARY TRACT: Kidneys are orthotopic, demonstrating normal size and morphology. No nephrolithiasis, hydronephrosis; limited assessment for renal masses on this nonenhanced examination. RIGHT pelviectasis. The unopacified ureters are normal in course and caliber. Urinary bladder is partially distended and unremarkable. Normal adrenal glands. STOMACH/BOWEL: The stomach, small and large bowel are normal in course and caliber. Severe descending and sigmoid colonic diverticulosis. Ascending and transverse colon air contrast levels. Normal appendix. VASCULAR/LYMPHATIC: Aortoiliac vessels are normal in course and caliber. Moderate to severe calcific atherosclerosis. No lymphadenopathy by CT size criteria. REPRODUCTIVE: Status post hysterectomy. OTHER: No intraperitoneal free fluid or free air. MUSCULOSKELETAL: Non-acute. Status post anterior abdominal wall herniorrhaphy. Moderate sacroiliac  osteoarthrosis. Severe lower lumbar facet arthropathy. Grade 1 L4-5 anterolisthesis without spondylolysis. Moderate to severe L4-5 neural foraminal narrowing. IMPRESSION: 1. Cholelithiasis and gallbladder distension without additional findings of acute cholecystitis. 2. Colonic air contrast levels, possible enteritis. Colonic diverticulosis. 3. Cardiomegaly and small pleural effusions. Aortic Atherosclerosis (ICD10-I70.0). Electronically Signed   By: Elon Alas M.D.   On: 07/14/2017 17:39   Dg Chest 2 View  Result Date: 07/14/2017 CLINICAL DATA:  Cough and congestion with fatigue 6 days. EXAM: CHEST - 2 VIEW COMPARISON:  08/19/2009 FINDINGS: Lungs are adequately inflated with mild prominence of the perihilar markings with middle patchy density in the left perihilar region and left base. Small left pleural effusion. Moderate cardiomegaly. Degenerative change of the spine. IMPRESSION: Findings suggesting mild CHF. Possible infection over the left mid to lower lung. Electronically Signed   By: Marin Olp M.D.   On: 07/14/2017 15:18    Pending Labs Unresulted Labs (From admission, onward)   Start     Ordered   07/15/17 0500  CBC  Tomorrow morning,   R     07/14/17 2208   07/15/17 0500  Comprehensive metabolic panel  Tomorrow morning,   R     07/14/17 2208   07/15/17 0500  Protime-INR  Tomorrow morning,   R     07/14/17 2208   07/14/17 2208  CBC  Now then every 4 hours,   R     07/14/17 2208   07/14/17 2208  Protime-INR  Now then every 4 hours,   R     07/14/17 2208   07/14/17 2208  Culture, sputum-assessment  Once,   R    Question:  Patient immune status  Answer:  Immunocompromised   07/14/17 2208   07/14/17 2208  Hemoglobin  A1c  Once,   R    Comments:  To assess prior glycemic control    07/14/17 2208   07/14/17 1820  Urine Culture  Once,   STAT     07/14/17 1819   07/14/17 1523  Blood Culture (routine x 2)  BLOOD CULTURE X 2,   STAT     07/14/17 1523   Signed and Held  Stool  culture (children & immunocomp patients)  Once,   R     Signed and Held   Signed and Held  Ferritin  Once,   R     Signed and Held   Signed and Held  Iron and TIBC  Once,   R     Signed and Held   Signed and Held  Vitamin B12  Once,   R     Signed and Held   Signed and Held  Folate RBC  Once,   R     Signed and Held      Vitals/Pain Today's Vitals   07/14/17 2032 07/14/17 2100 07/14/17 2130 07/14/17 2151  BP: (!) 136/49 (!) 118/42 (!) 134/46 (!) 136/46  Pulse: (!) 49 (!) 50  (!) 50  Resp: _0 Temp:      TempSrc:      SpO2: 98% 97% 97% 98%  Weight:      Height:      PainSc:        Isolation Precautions No active isolations  Medications Medications  allopurinol (ZYLOPRIM) tablet 100 mg (has no administration in time range)  amLODipine (NORVASC) tablet 10 mg (has no administration in time range)  loratadine (CLARITIN) tablet 10 mg (has no administration in time range)  fluticasone (FLONASE) 50 MCG/ACT nasal spray 2 spray (has no administration in time range)  furosemide (LASIX) tablet 40 mg (has no administration in time range)  guaiFENesin (ROBITUSSIN) 100 MG/5ML solution 100-200 mg (has no administration in time range)  hydrALAZINE (APRESOLINE) tablet 100 mg (has no administration in time range)  levothyroxine (SYNTHROID, LEVOTHROID) tablet 50 mcg (has no administration in time range)  MULTIVITAMIN ADULTS 50+ TABS (has no administration in time range)  simvastatin (ZOCOR) tablet 40 mg (has no administration in time range)  0.9 %  sodium chloride infusion (has no administration in time range)  acetaminophen (TYLENOL) tablet 650 mg (has no administration in time range)    Or  acetaminophen (TYLENOL) suppository 650 mg (has no administration in time range)  oxyCODONE (Oxy IR/ROXICODONE) immediate release tablet 5 mg (has no administration in time range)  ondansetron (ZOFRAN) tablet 4 mg (has no administration in time range)    Or  ondansetron (ZOFRAN) injection 4  mg (has no administration in time range)  phytonadione (VITAMIN K) tablet 2.5 mg (has no administration in time range)  pantoprazole (PROTONIX) 80 mg in sodium chloride 0.9 % 250 mL (0.32 mg/mL) infusion (8 mg/hr Intravenous New Bag/Given 07/14/17 2136)  pantoprazole (PROTONIX) injection 40 mg (has no administration in time range)  insulin aspart (novoLOG) injection 0-15 Units (has no administration in time range)  ipratropium-albuterol (DUONEB) 0.5-2.5 (3) MG/3ML nebulizer solution 3 mL (has no administration in time range)  sodium chloride 0.9 % bolus 500 mL (0 mLs Intravenous Stopped 07/14/17 1634)  cefTRIAXone (ROCEPHIN) 1 g in sodium chloride 0.9 % 100 mL IVPB (0 g Intravenous Stopped 07/14/17 1920)  azithromycin (ZITHROMAX) 500 mg in sodium chloride 0.9 % 250 mL IVPB (0 mg Intravenous Stopped 07/14/17 2104)  pantoprazole (PROTONIX) 80  mg in sodium chloride 0.9 % 100 mL IVPB (0 mg Intravenous Stopped 07/14/17 2136)    Mobility walks with device

## 2017-07-15 ENCOUNTER — Other Ambulatory Visit (HOSPITAL_COMMUNITY): Payer: Medicare HMO

## 2017-07-15 DIAGNOSIS — K529 Noninfective gastroenteritis and colitis, unspecified: Secondary | ICD-10-CM

## 2017-07-15 DIAGNOSIS — K922 Gastrointestinal hemorrhage, unspecified: Secondary | ICD-10-CM

## 2017-07-15 DIAGNOSIS — I10 Essential (primary) hypertension: Secondary | ICD-10-CM

## 2017-07-15 DIAGNOSIS — D649 Anemia, unspecified: Secondary | ICD-10-CM

## 2017-07-15 DIAGNOSIS — T45515A Adverse effect of anticoagulants, initial encounter: Secondary | ICD-10-CM

## 2017-07-15 DIAGNOSIS — N390 Urinary tract infection, site not specified: Secondary | ICD-10-CM

## 2017-07-15 DIAGNOSIS — N179 Acute kidney failure, unspecified: Secondary | ICD-10-CM

## 2017-07-15 DIAGNOSIS — R059 Cough, unspecified: Secondary | ICD-10-CM

## 2017-07-15 DIAGNOSIS — E860 Dehydration: Secondary | ICD-10-CM

## 2017-07-15 DIAGNOSIS — R05 Cough: Secondary | ICD-10-CM

## 2017-07-15 DIAGNOSIS — E785 Hyperlipidemia, unspecified: Secondary | ICD-10-CM

## 2017-07-15 DIAGNOSIS — D6832 Hemorrhagic disorder due to extrinsic circulating anticoagulants: Secondary | ICD-10-CM

## 2017-07-15 DIAGNOSIS — G301 Alzheimer's disease with late onset: Secondary | ICD-10-CM

## 2017-07-15 DIAGNOSIS — R791 Abnormal coagulation profile: Secondary | ICD-10-CM

## 2017-07-15 LAB — COMPREHENSIVE METABOLIC PANEL
ALBUMIN: 3 g/dL — AB (ref 3.5–5.0)
ALK PHOS: 76 U/L (ref 38–126)
ALT: 100 U/L — ABNORMAL HIGH (ref 14–54)
ANION GAP: 12 (ref 5–15)
AST: 211 U/L — ABNORMAL HIGH (ref 15–41)
BUN: 76 mg/dL — ABNORMAL HIGH (ref 6–20)
CALCIUM: 8.1 mg/dL — AB (ref 8.9–10.3)
CO2: 22 mmol/L (ref 22–32)
Chloride: 105 mmol/L (ref 101–111)
Creatinine, Ser: 1.77 mg/dL — ABNORMAL HIGH (ref 0.44–1.00)
GFR calc non Af Amer: 25 mL/min — ABNORMAL LOW (ref >60)
GFR, EST AFRICAN AMERICAN: 29 mL/min — AB (ref >60)
GLUCOSE: 107 mg/dL — AB (ref 65–99)
Potassium: 3.4 mmol/L — ABNORMAL LOW (ref 3.5–5.1)
SODIUM: 139 mmol/L (ref 135–145)
Total Bilirubin: 0.5 mg/dL (ref 0.3–1.2)
Total Protein: 6.6 g/dL (ref 6.5–8.1)

## 2017-07-15 LAB — HEMOGLOBIN A1C
Hgb A1c MFr Bld: 5.5 % (ref 4.8–5.6)
MEAN PLASMA GLUCOSE: 111.15 mg/dL

## 2017-07-15 LAB — CBC
HCT: 27.1 % — ABNORMAL LOW (ref 36.0–46.0)
HEMATOCRIT: 25.8 % — AB (ref 36.0–46.0)
HEMATOCRIT: 26 % — AB (ref 36.0–46.0)
HEMOGLOBIN: 8.4 g/dL — AB (ref 12.0–15.0)
Hemoglobin: 8.7 g/dL — ABNORMAL LOW (ref 12.0–15.0)
Hemoglobin: 8.3 g/dL — ABNORMAL LOW (ref 12.0–15.0)
MCH: 29.3 pg (ref 26.0–34.0)
MCH: 29.4 pg (ref 26.0–34.0)
MCH: 29.8 pg (ref 26.0–34.0)
MCHC: 32.1 g/dL (ref 30.0–36.0)
MCHC: 32.2 g/dL (ref 30.0–36.0)
MCHC: 32.3 g/dL (ref 30.0–36.0)
MCV: 91.2 fL (ref 78.0–100.0)
MCV: 91.5 fL (ref 78.0–100.0)
MCV: 92.2 fL (ref 78.0–100.0)
PLATELETS: 183 10*3/uL (ref 150–400)
Platelets: 190 10*3/uL (ref 150–400)
Platelets: 190 10*3/uL (ref 150–400)
RBC: 2.97 MIL/uL — AB (ref 3.87–5.11)
RBC: 2.82 MIL/uL — ABNORMAL LOW (ref 3.87–5.11)
RBC: 2.82 MIL/uL — AB (ref 3.87–5.11)
RDW: 16.7 % — AB (ref 11.5–15.5)
RDW: 16.8 % — ABNORMAL HIGH (ref 11.5–15.5)
RDW: 16.9 % — ABNORMAL HIGH (ref 11.5–15.5)
WBC: 8.7 10*3/uL (ref 4.0–10.5)
WBC: 9.2 10*3/uL (ref 4.0–10.5)
WBC: 9.3 10*3/uL (ref 4.0–10.5)

## 2017-07-15 LAB — GLUCOSE, CAPILLARY
GLUCOSE-CAPILLARY: 114 mg/dL — AB (ref 65–99)
GLUCOSE-CAPILLARY: 118 mg/dL — AB (ref 65–99)
Glucose-Capillary: 105 mg/dL — ABNORMAL HIGH (ref 65–99)
Glucose-Capillary: 116 mg/dL — ABNORMAL HIGH (ref 65–99)
Glucose-Capillary: 126 mg/dL — ABNORMAL HIGH (ref 65–99)
Glucose-Capillary: 142 mg/dL — ABNORMAL HIGH (ref 65–99)

## 2017-07-15 LAB — PROTIME-INR
INR: 6.98
INR: 6.55 — AB
INR: 6.01 — AB
PROTHROMBIN TIME: 56.9 s — AB (ref 11.4–15.2)
Prothrombin Time: 59.8 seconds — ABNORMAL HIGH (ref 11.4–15.2)
Prothrombin Time: 53.2 seconds — ABNORMAL HIGH (ref 11.4–15.2)

## 2017-07-15 LAB — IRON AND TIBC
Iron: 27 ug/dL — ABNORMAL LOW (ref 28–170)
SATURATION RATIOS: 11 % (ref 10.4–31.8)
TIBC: 241 ug/dL — ABNORMAL LOW (ref 250–450)
UIBC: 214 ug/dL

## 2017-07-15 LAB — FERRITIN: FERRITIN: 120 ng/mL (ref 11–307)

## 2017-07-15 LAB — VITAMIN B12: Vitamin B-12: 637 pg/mL (ref 180–914)

## 2017-07-15 LAB — BRAIN NATRIURETIC PEPTIDE: B NATRIURETIC PEPTIDE 5: 301.1 pg/mL — AB (ref 0.0–100.0)

## 2017-07-15 MED ORDER — SODIUM CHLORIDE 0.9 % IV SOLN
1.0000 g | INTRAVENOUS | Status: AC
  Administered 2017-07-15 – 2017-07-18 (×4): 1 g via INTRAVENOUS
  Filled 2017-07-15 (×4): qty 1

## 2017-07-15 MED ORDER — SODIUM CHLORIDE 0.9 % IV SOLN
500.0000 mg | INTRAVENOUS | Status: AC
  Administered 2017-07-15 – 2017-07-18 (×4): 500 mg via INTRAVENOUS
  Filled 2017-07-15 (×5): qty 500

## 2017-07-15 MED ORDER — SODIUM CHLORIDE 0.9 % IV SOLN: 500.0000 mg | INTRAVENOUS | Status: DC

## 2017-07-15 MED ORDER — DM-GUAIFENESIN ER 30-600 MG PO TB12
1.0000 | ORAL_TABLET | Freq: Two times a day (BID) | ORAL | Status: DC
  Administered 2017-07-15 – 2017-07-22 (×15): 1 via ORAL
  Filled 2017-07-15 (×15): qty 1

## 2017-07-15 MED ORDER — PHYTONADIONE 5 MG PO TABS
2.5000 mg | ORAL_TABLET | Freq: Once | ORAL | Status: AC
  Administered 2017-07-15: 2.5 mg via ORAL
  Filled 2017-07-15: qty 1

## 2017-07-15 MED ORDER — SODIUM CHLORIDE 0.9 % IV SOLN: 2.0000 g | INTRAVENOUS | Status: DC

## 2017-07-15 NOTE — Progress Notes (Signed)
MD paged due to patient's BP of 145/42. Orders given to hold AM BP medications. Will continue to monitor.

## 2017-07-15 NOTE — Progress Notes (Signed)
CRITICAL VALUE ALERT  Critical Value:  6.01  Date & Time Notied: 07/15/17, 0730  Provider Notified: Mikhail @ [phone]  Orders Received/Actions taken: None. Will continue to monitor and await orders

## 2017-07-15 NOTE — Progress Notes (Signed)
CRITICAL VALUE ALERT  Critical Value:  INR 6.55  Date & Time Notied:  5/20 0537  Provider Notified: Bodenheimer  Orders Received/Actions taken: Waiting on orders

## 2017-07-15 NOTE — Progress Notes (Signed)
CRITICAL VALUE ALERT  Critical Value: INR 6.98  Date & Time Notied:  07/15/17 0010  Provider Notified: Rana Snare  Orders Received/Actions taken: Waiting on orders

## 2017-07-15 NOTE — Progress Notes (Signed)
PT Cancellation Note  Patient Details Name: [name] MRN: [id number] DOB: 26-Apr-1929   Cancelled Treatment:    Reason Eval/Treat Not Completed: Medical issues which prohibited therapy(elevated INR, RN requested to check back)   Tonjia Parillo,[name] 07/15/2017, 10:33 AM Zenovia Jarred, PT, DPT 07/15/2017 Pager: [phone]

## 2017-07-15 NOTE — Progress Notes (Signed)
PROGRESS NOTE    [name]  ZOX:[id number] DOB: 1929/03/03 DOA: 07/14/2017 PCP: Sheliah Hatch, MD   Brief Narrative:  HPI on 07/14/2017 by Dr. Tonye Royalty [name] is a 84 y.o. female with a known history of afib on Coumadin, CHD, CKD3, DM2, HTN, GERD, HLD, ?CHF presents to the emergency department for evaluation of fever.  Patient was in a usual state of health until several days ago when her daughter describes the onset of weakness, decreased PO intake, fever to 100.9 and scant cough.  She also describes SOB with O2 sats in the high 70s this morning.  Questionable history of CHF. Has not seen a cardiologist.   Last INR check was 3 (06/24/17) and patient had been holding coumadin because of this. Daughter also reports several episodes of dark stools this week, but no other signs of bleeding. She does have chronic diarrhea.  Patient has never had a colonoscopy.    At baseline patient mobilizes with a walker at home but over the past week she has been using a wheelchair because of weakness and SOB.     Otherwise there has been no change in status. Patient has been taking medication as prescribed and there has been no recent change in medication or diet.  No recent antibiotics.  There has been no recent illness, hospitalizations, travel or sick contacts.     Assessment & Plan   Community-acquired pneumonia/UTI -upon admission, patient did not meet sepsis criteria -Chest x-ray reviewed, possible infection with mild CHF -UA showed positive nitrites, large leukocytes, many bacteria, greater than 50 WBC -Blood and urine cultures pending -Continue azithromycin and ceftriaxone  GI bleed with supratherapeutic INR/ Acute blood loss anemia/acute on chronic normocytic anemia -per daughter, patient has had dark stools -FOBT positive -Baseline hemoglobin approximately 11-12, currently 8.3 -Continue Protonix drip -Patient given vitamin K orally on admission, will give additional  dose as INR continues to be greater than 6 -If hemoglobin drops to less than 7, will transfuse -Anemia panel pending  -Continue to monitor CBC -Of note, discussed with patient's daughter possible endoscopy or colonoscopy.  Patient's daughter would like to defer for now and wishes to not have any invasive testing or procedures.  Hypoxia secondary to diastolic CHF exacerbation -Prior to admission, patient had high saturations in the 70s -Chest x-ray showed mild CHF -BNP pending -Continue supplemental oxygen to maintain saturations over 92% -Echocardiogram 11/28/2011 showed an EF of 60 to 65%, grade 1 diastolic dysfunction -Repeat echocardiogram pending -Continue Lasix -Monitor intake and output, daily weights  Diarrhea -Possible enteritis -has not had any bowel movements since admission -stool culture pending   Chronic kidney disease, stage IV -Upon review of patient's chart, she has been in stage IV chronic kidney disease since 2012 -Continue to monitor BMP  Essential hypertension -Continue amlodipine, hydralazine with holding parameters given GI bleed  Hyperlipidemia -Continue statin  Hypothyroidism -Continue Synthroid  Diabetes mellitus, type II -Diet controlled as patient is not on antihyperglycemics -Hemoglobin A1c 5.5 -Continue insulin sliding scale, CBG monitoring  Dementia -Stable  Atrial fibrillation, chronic  -As above, patient with supratherapeutic INR -CHADSVASC at least 6 (given gender, age, hypertension, CHF, diabetes) -Currently rate controlled -Discussed with daughter the possible need to discontinue Coumadin  Code Status -Discussed with patient's daughter, patient is a DNR.   DVT Prophylaxis  SCDs  Code Status: DNR  Family Communication: Daughter via phone  Disposition Plan: Admitted. Possibly home when patient is stable  Consultants None  Procedures  None  Antibiotics   Anti-infectives (From admission, onward)   Start     Dose/Rate  Route Frequency Ordered Stop   07/15/17 2000  azithromycin (ZITHROMAX) 500 mg in sodium chloride 0.9 % 250 mL IVPB     500 mg 250 mL/hr over 60 Minutes Intravenous Every 24 hours 07/15/17 0143     07/15/17 1800  cefTRIAXone (ROCEPHIN) 1 g in sodium chloride 0.9 % 100 mL IVPB     1 g 200 mL/hr over 30 Minutes Intravenous Every 24 hours 07/15/17 0143     07/15/17 1132  cefTRIAXone (ROCEPHIN) 2 g in sodium chloride 0.9 % 100 mL IVPB     2 g 200 mL/hr over 30 Minutes Intravenous Every 24 hours 07/15/17 1132     07/15/17 1132  azithromycin (ZITHROMAX) 500 mg in sodium chloride 0.9 % 250 mL IVPB  Status:  Discontinued     500 mg 250 mL/hr over 60 Minutes Intravenous Every 24 hours 07/15/17 1132 07/15/17 1135   07/14/17 1845  azithromycin (ZITHROMAX) 500 mg in sodium chloride 0.9 % 250 mL IVPB     500 mg 250 mL/hr over 60 Minutes Intravenous  Once 07/14/17 1831 07/14/17 2104   07/14/17 1830  cefTRIAXone (ROCEPHIN) 1 g in sodium chloride 0.9 % 100 mL IVPB     1 g 200 mL/hr over 30 Minutes Intravenous  Once 07/14/17 1819 07/14/17 1920      Subjective:   [name] seen and examined today.  Patient with dementia.  Currently has no complaints.  States she came to the hospital for her blood being abnormal.  Objective:   Vitals:   07/14/17 2151 07/14/17 2238 07/15/17 0000 07/15/17 0556  BP: (!) 136/46 (!) 156/45 (!) 140/42 (!) 155/41  Pulse: (!) 50 (!) 47 67 (!) 57  Resp: Temp:  98.6 F (37 C)  98.6 F (37 C)  TempSrc:  Oral  Oral  SpO2: 98% 99%  97%  Weight:      Height:        Intake/Output Summary (Last 24 hours) at 07/15/2017 1136 Last data filed at 07/15/2017 0940 Gross per 24 hour  Intake 366.25 ml  Output 500 ml  Net -133.75 ml   Filed Weights   07/14/17 1420  Weight: 74.8 kg (165 lb)    Exam  General: Well developed, well nourished, NAD, appears stated age  HEENT: NCAT, mucous membranes moist.   Neck: Supple  Cardiovascular: S1 S2 auscultated,  irregular  Respiratory: Diminished breath sounds, no wheezing  Abdomen: Soft, nontender, nondistended, + bowel sounds  Extremities: warm dry without cyanosis clubbing or edema  Neuro: AAOx2 (place and self/name, not DOB), dementia, nonfocal  Psych: Appropriate mood and affect   Data Reviewed: I have personally reviewed following labs and imaging studies  CBC: Recent Labs  Lab 07/14/17 1442 07/14/17 2248 07/15/17 0313 07/15/17 0632 07/15/17 1124  WBC 10.2 9.1 8.7 9.2 9.3  HGB 9.5* 8.9* 8.7* 8.3* 8.4*  HCT 29.0* 27.8* 27.1* 25.8* 26.0*  MCV 90.9 90.8 91.2 91.5 92.2  PLT 221 193 183 190 190   Basic Metabolic Panel: Recent Labs  Lab 07/14/17 1442 07/15/17 0313  NA 138 139  K 3.4* 3.4*  CL 103 105  CO2 22 22  GLUCOSE 154* 107*  BUN 86* 76*  CREATININE 1.87* 1.77*  CALCIUM 8.8* 8.1*   GFR: Estimated Creatinine Clearance: 20.1 mL/min (A) (by C-G formula based on SCr of 1.77 mg/dL (H)). Liver Function Tests:  Recent Labs  Lab 07/14/17 1442 07/15/17 0313  AST 242* 211*  ALT 106* 100*  ALKPHOS 84 76  BILITOT 0.8 0.5  PROT 7.3 6.6  ALBUMIN 3.2* 3.0*   No results for input(s): LIPASE, AMYLASE in the last 168 hours. No results for input(s): AMMONIA in the last 168 hours. Coagulation Profile: Recent Labs  Lab 07/14/17 1638 07/14/17 2248 07/15/17 0313 07/15/17 0632  INR 6.82* 6.98* 6.55* 6.01*   Cardiac Enzymes: No results for input(s): CKTOTAL, CKMB, CKMBINDEX, TROPONINI in the last 168 hours. BNP (last 3 results) No results for input(s): PROBNP in the last 8760 hours. HbA1C: Recent Labs    07/14/17 2248  HGBA1C 5.5   CBG: Recent Labs  Lab 07/14/17 1431 07/15/17 0009 07/15/17 0424 07/15/17 0735  GLUCAP 182* 142* 114* 116*   Lipid Profile: No results for input(s): CHOL, HDL, LDLCALC, TRIG, CHOLHDL, LDLDIRECT in the last 72 hours. Thyroid Function Tests: No results for input(s): TSH, T4TOTAL, FREET4, T3FREE, THYROIDAB in the last 72  hours. Anemia Panel: No results for input(s): VITAMINB12, FOLATE, FERRITIN, TIBC, IRON, RETICCTPCT in the last 72 hours. Urine analysis:    Component Value Date/Time   COLORURINE YELLOW 07/14/2017 1638   APPEARANCEUR CLOUDY (A) 07/14/2017 1638   LABSPEC 1.011 07/14/2017 1638   PHURINE 5.0 07/14/2017 1638   GLUCOSEU NEGATIVE 07/14/2017 1638   HGBUR NEGATIVE 07/14/2017 1638   BILIRUBINUR NEGATIVE 07/14/2017 1638   KETONESUR NEGATIVE 07/14/2017 1638   PROTEINUR NEGATIVE 07/14/2017 1638   UROBILINOGEN 0.2 12/15/2010 1619   NITRITE POSITIVE (A) 07/14/2017 1638   LEUKOCYTESUR LARGE (A) 07/14/2017 1638   Sepsis Labs: (procalcitonin:4,lacticidven:4)  ) Recent Results (from the past 240 hour(s))  Blood Culture (routine x 2)     Status: None (Preliminary result)   Collection Time: 07/14/17  3:23 PM  Result Value Ref Range Status   Specimen Description   Final    BLOOD LEFT FOREARM Performed at [facility], [address] [place], [place] [address]    Special Requests   Final    BOTTLES DRAWN AEROBIC AND ANAEROBIC Blood Culture results may not be optimal due to an excessive volume of blood received in culture bottles Performed at [facility], [address], [place], [place] [address]    Culture PENDING  Incomplete   Report Status PENDING  Incomplete      Radiology Studies: Ct Abdomen Pelvis Wo Contrast  Result Date: 07/14/2017 CLINICAL DATA:  Cough, congestion and fatigue for 6 days. History of hysterectomy. EXAM: CT ABDOMEN AND PELVIS WITHOUT CONTRAST TECHNIQUE: Multidetector CT imaging of the abdomen and pelvis was performed following the standard protocol without IV contrast. COMPARISON:  None. FINDINGS: LOWER CHEST: Small bilateral pleural effusions. Included heart is enlarged. Trace pericardial effusion. HEPATOBILIARY: Distended gallbladder with 2.6 cm gallstone. Negative noncontrast CT liver. PANCREAS: Normal. SPLEEN: Normal.  ADRENALS/URINARY TRACT: Kidneys are orthotopic, demonstrating normal size and morphology. No nephrolithiasis, hydronephrosis; limited assessment for renal masses on this nonenhanced examination. RIGHT pelviectasis. The unopacified ureters are normal in course and caliber. Urinary bladder is partially distended and unremarkable. Normal adrenal glands. STOMACH/BOWEL: The stomach, small and large bowel are normal in course and caliber. Severe descending and sigmoid colonic diverticulosis. Ascending and transverse colon air contrast levels. Normal appendix. VASCULAR/LYMPHATIC: Aortoiliac vessels are normal in course and caliber. Moderate to severe calcific atherosclerosis. No lymphadenopathy by CT size criteria. REPRODUCTIVE: Status post hysterectomy. OTHER: No intraperitoneal free fluid or free air. MUSCULOSKELETAL: Non-acute. Status post anterior abdominal  wall herniorrhaphy. Moderate sacroiliac osteoarthrosis. Severe lower lumbar facet arthropathy. Grade 1 L4-5 anterolisthesis without spondylolysis. Moderate to severe L4-5 neural foraminal narrowing. IMPRESSION: 1. Cholelithiasis and gallbladder distension without additional findings of acute cholecystitis. 2. Colonic air contrast levels, possible enteritis. Colonic diverticulosis. 3. Cardiomegaly and small pleural effusions. Aortic Atherosclerosis (ICD10-I70.0). Electronically Signed   By: Awilda Metro M.D.   On: 07/14/2017 17:39   Dg Chest 2 View  Result Date: 07/14/2017 CLINICAL DATA:  Cough and congestion with fatigue 6 days. EXAM: CHEST - 2 VIEW COMPARISON:  08/19/2009 FINDINGS: Lungs are adequately inflated with mild prominence of the perihilar markings with middle patchy density in the left perihilar region and left base. Small left pleural effusion. Moderate cardiomegaly. Degenerative change of the spine. IMPRESSION: Findings suggesting mild CHF. Possible infection over the left mid to lower lung. Electronically Signed   By: Elberta Fortis M.D.   On:  07/14/2017 15:18     Scheduled Meds: . allopurinol  100 mg Oral Daily  . amLODipine  10 mg Oral QHS  . atorvastatin  20 mg Oral QHS  . dextromethorphan-guaiFENesin  1 tablet Oral BID  . fluticasone  2 spray Each Nare Daily  . furosemide  40 mg Oral Daily  . hydrALAZINE  100 mg Oral TID  . insulin aspart  0-15 Units Subcutaneous Q4H  . levothyroxine  50 mcg Oral Daily  . loratadine  10 mg Oral Daily  . multivitamin with minerals   Oral Daily  . [START ON 07/18/2017] pantoprazole  40 mg Intravenous Q12H   Continuous Infusions: . sodium chloride 75 mL/hr at 07/15/17 0216  . azithromycin    . cefTRIAXone (ROCEPHIN)  IV    . cefTRIAXone (ROCEPHIN)  IV    . pantoprozole (PROTONIX) infusion 8 mg/hr (07/15/17 0649)     LOS: 1 day   Time Spent in minutes   45 minutes (greater than 50% of time spent with patient face to face, discussing labs and results with daughter, as well as reviewing old records, and formulating a plan)  Ekam Bonebrake D.O. on 07/15/2017 at 11:36 AM  Between 7am to 7pm - Pager - [phone]  After 7pm go to [email or web address] - password [email or web address]  And look for the night coverage person covering for me after hours  [facility] Office  [phone]

## 2017-07-16 ENCOUNTER — Inpatient Hospital Stay (HOSPITAL_COMMUNITY): Payer: Medicare HMO

## 2017-07-16 DIAGNOSIS — I340 Nonrheumatic mitral (valve) insufficiency: Secondary | ICD-10-CM

## 2017-07-16 DIAGNOSIS — K219 Gastro-esophageal reflux disease without esophagitis: Secondary | ICD-10-CM

## 2017-07-16 LAB — CBC
HCT: 25.2 % — ABNORMAL LOW (ref 36.0–46.0)
Hemoglobin: 8 g/dL — ABNORMAL LOW (ref 12.0–15.0)
MCH: 29.4 pg (ref 26.0–34.0)
MCHC: 31.7 g/dL (ref 30.0–36.0)
MCV: 92.6 fL (ref 78.0–100.0)
PLATELETS: 196 10*3/uL (ref 150–400)
RBC: 2.72 MIL/uL — AB (ref 3.87–5.11)
RDW: 17.1 % — ABNORMAL HIGH (ref 11.5–15.5)
WBC: 8.5 10*3/uL (ref 4.0–10.5)

## 2017-07-16 LAB — ECHOCARDIOGRAM COMPLETE
Height: 59 in
Weight: 2640 oz

## 2017-07-16 LAB — BASIC METABOLIC PANEL
Anion gap: 12 (ref 5–15)
BUN: 67 mg/dL — AB (ref 6–20)
CO2: 21 mmol/L — ABNORMAL LOW (ref 22–32)
CREATININE: 1.73 mg/dL — AB (ref 0.44–1.00)
Calcium: 8.4 mg/dL — ABNORMAL LOW (ref 8.9–10.3)
Chloride: 110 mmol/L (ref 101–111)
GFR calc Af Amer: 30 mL/min — ABNORMAL LOW (ref >60)
GFR calc non Af Amer: 26 mL/min — ABNORMAL LOW (ref >60)
Glucose, Bld: 94 mg/dL (ref 65–99)
POTASSIUM: 3.7 mmol/L (ref 3.5–5.1)
SODIUM: 143 mmol/L (ref 135–145)

## 2017-07-16 LAB — PROTIME-INR
INR: 4.24 — AB
PROTHROMBIN TIME: 40.5 s — AB (ref 11.4–15.2)

## 2017-07-16 LAB — FOLATE RBC
Folate, Hemolysate: 480.3 ng/mL
Folate, RBC: 1862 ng/mL (ref >498)
Hematocrit: 25.8 % — ABNORMAL LOW (ref 34.0–46.6)

## 2017-07-16 LAB — GLUCOSE, CAPILLARY
GLUCOSE-CAPILLARY: 103 mg/dL — AB (ref 65–99)
GLUCOSE-CAPILLARY: 190 mg/dL — AB (ref 65–99)
Glucose-Capillary: 99 mg/dL (ref 65–99)
Glucose-Capillary: 87 mg/dL (ref 65–99)
Glucose-Capillary: 158 mg/dL — ABNORMAL HIGH (ref 65–99)
Glucose-Capillary: 195 mg/dL — ABNORMAL HIGH (ref 65–99)
Glucose-Capillary: 150 mg/dL — ABNORMAL HIGH (ref 65–99)

## 2017-07-16 MED ORDER — BOOST / RESOURCE BREEZE PO LIQD CUSTOM
1.0000 | Freq: Three times a day (TID) | ORAL | Status: DC
  Administered 2017-07-16 – 2017-07-22 (×10): 1 via ORAL

## 2017-07-16 NOTE — Plan of Care (Signed)
  Problem: Health Behavior/Discharge Planning: Goal: Ability to manage health-related needs will improve Outcome: Progressing   Problem: Clinical Measurements: Goal: Ability to maintain clinical measurements within normal limits will improve Outcome: Progressing Goal: Will remain free from infection Outcome: Progressing Goal: Diagnostic test results will improve Outcome: Progressing Goal: Respiratory complications will improve Outcome: Progressing Goal: Cardiovascular complication will be avoided Outcome: Progressing   Problem: Nutrition: Goal: Adequate nutrition will be maintained Outcome: Progressing   Problem: Coping: Goal: Level of anxiety will decrease Outcome: Progressing   Problem: Elimination: Goal: Will not experience complications related to bowel motility Outcome: Progressing Goal: Will not experience complications related to urinary retention Outcome: Progressing   Problem: Pain Managment: Goal: General experience of comfort will improve Outcome: Progressing   Problem: Safety: Goal: Ability to remain free from injury will improve Outcome: Progressing   Problem: Skin Integrity: Goal: Risk for impaired skin integrity will decrease Outcome: Progressing   Problem: Activity: Goal: Ability to tolerate increased activity will improve Outcome: Progressing   Problem: Clinical Measurements: Goal: Ability to maintain a body temperature in the normal range will improve Outcome: Progressing   Problem: Respiratory: Goal: Ability to maintain adequate ventilation will improve Outcome: Progressing Goal: Ability to maintain a clear airway will improve Outcome: Progressing   Problem: Urinary Elimination: Goal: Signs and symptoms of infection will decrease Outcome: Progressing

## 2017-07-16 NOTE — Consult Note (Signed)
   [facility] CM Inpatient Consult   07/16/2017  [name] 29-Nov-1929 [id number]   Patient screened for potential [facility] Care Management services due to increased unplanned readmission risk score of 24% (high).   Chart reviewed. Noted Primary Care MD notes pertaining to [facility].   Telephone call to Tammy with [facility] (home based outpatient palliative care program) that [name] is indeed enrolled in their program. Therefore, Clinical research associate will not attempt to engage for [facility] Care Management.  Made inpatient RNCM aware that patient is active with [facility] (home based outpatient palliative care program administered by [facility]).   Raiford Noble, MSN-Ed, RN,BSN [facility] Liaison [phone]

## 2017-07-16 NOTE — Progress Notes (Signed)
Nutrition Brief Note  Patient identified on the Malnutrition Screening Tool (MST) Report  Pt with uncertain weight loss. Weight on 5/19 is not a scaled weight. Pt was eating normally a few days PTA. Pt on clears now. If weight is accurate from 5/19, weight loss would be insignificant for time frame and potentially fluid loss given CHF.  Wt Readings from Last 15 Encounters:  07/14/17 165 lb (74.8 kg)  06/26/17 171 lb (77.6 kg)  05/06/17 172 lb 6.4 oz (78.2 kg)  10/25/16 171 lb 12.8 oz (77.9 kg)  04/17/16 182 lb 12.8 oz (82.9 kg)  03/12/16 182 lb (82.6 kg)  01/23/16 179 lb 6 oz (81.4 kg)  01/16/16 181 lb (82.1 kg)  12/12/15 180 lb 3.2 oz (81.7 kg)  09/12/15 180 lb (81.6 kg)  07/04/15 176 lb 4 oz (79.9 kg)  06/28/15 180 lb 2 oz (81.7 kg)  05/24/15 183 lb (83 kg)  04/25/15 170 lb (77.1 kg)  04/12/15 179 lb 9.6 oz (81.5 kg)    Body mass index is 33.33 kg/m. Patient meets criteria for obesity based on current BMI.   Current diet order is clears. Labs and medications reviewed.   No nutrition interventions warranted at this time. If nutrition issues arise, please consult RD.   Tilda Franco, MS, RD, LDN [facility] Inpatient Clinical Dietitian Pager: [phone] After Hours Pager: [phone]

## 2017-07-16 NOTE — Progress Notes (Addendum)
PROGRESS NOTE    [name]  ONG:[id number] DOB: 01-21-1930 DOA: 07/14/2017 PCP: Sheliah Hatch, MD   Brief Narrative:  HPI on 07/14/2017 by Dr. Tonye Royalty [name] is a 84 y.o. female with a known history of afib on Coumadin, CHD, CKD3, DM2, HTN, GERD, HLD, ?CHF presents to the emergency department for evaluation of fever.  Patient was in a usual state of health until several days ago when her daughter describes the onset of weakness, decreased PO intake, fever to 100.9 and scant cough.  She also describes SOB with O2 sats in the high 70s this morning.  Questionable history of CHF. Has not seen a cardiologist.  Last INR check was 3 (06/24/17) and patient had been holding coumadin because of this. Daughter also reports several episodes of dark stools this week, but no other signs of bleeding. She does have chronic diarrhea.  Patient has never had a colonoscopy.   At baseline patient mobilizes with a walker at home but over the past week she has been using a wheelchair because of weakness and SOB.   Otherwise there has been no change in status. Patient has been taking medication as prescribed and there has been no recent change in medication or diet.  No recent antibiotics.  There has been no recent illness, hospitalizations, travel or sick contacts.    Interim history Patient admitted for multiple medical problems including community acquired pneumonia, UTI, GI bleed with supratherapeutic INR, possibly diastolic CHF exacerbation. Assessment & Plan   Community-acquired pneumonia/UTI -upon admission, patient did not meet sepsis criteria -Chest x-ray reviewed, possible infection with mild CHF -UA showed positive nitrites, large leukocytes, many bacteria, greater than 50 WBC -Blood and urine cultures pending -Continue azithromycin and ceftriaxone  GI bleed with supratherapeutic INR/ Acute blood loss anemia/acute on chronic normocytic anemia -per daughter, patient has had dark  stools -FOBT positive -Baseline hemoglobin approximately 11-12, currently 8 -Continue Protonix drip -Patient given vitamin K orally. INR down to 4.24 -If hemoglobin drops to less than 7, will transfuse -Anemia panel: Iron 27, ferritin 120, saturation ratio 11 -Of note, discussed with patient's daughter possible endoscopy or colonoscopy.  Patient's daughter would like to defer for now and wishes to not have any invasive testing or procedures. -placed on clear liquids  Hypoxia secondary to diastolic CHF exacerbation -Prior to admission, patient had high saturations in the 70s -Chest x-ray showed mild CHF -BNP 301.1 -Continue supplemental oxygen to maintain saturations over 92% -Echocardiogram 11/28/2011 showed an EF of 60 to 65%, grade 1 diastolic dysfunction -Discontinued IVF -Repeat echocardiogram pending -Continue Lasix -Monitor intake and output, daily weights  Diarrhea -Possible enteritis -has not had any bowel movements since admission -stool culture pending   Chronic kidney disease, stage IV -Upon review of patient's chart, she has been in stage IV chronic kidney disease since 2012 -Creatinine currently 1.73  -continue to monitor BMP  Essential hypertension -Continue amlodipine, hydralazine with holding parameters given GI bleed  Hyperlipidemia -Continue statin  Hypothyroidism -Continue Synthroid  Diabetes mellitus, type II -Diet controlled as patient is not on antihyperglycemics -Hemoglobin A1c 5.5 -Continue insulin sliding scale, CBG monitoring  Dementia -Stable  Atrial fibrillation, chronic  -As above, patient with supratherapeutic INR on admission (6.98), INR down to 4.24 -CHADSVASC at least 6 (given gender, age, hypertension, CHF, diabetes) -Currently rate controlled -Discussed with daughter the possible need to discontinue Coumadin long term  Code Status -Discussed with patient's daughter, patient is a DNR.   DVT Prophylaxis  SCDs  Code Status:  DNR  Family Communication: None at bedside.  Disposition Plan: Admitted. Possibly home with daughter, when patient is stable  Consultants None  Procedures  None  Antibiotics   Anti-infectives (From admission, onward)   Start     Dose/Rate Route Frequency Ordered Stop   07/15/17 2000  azithromycin (ZITHROMAX) 500 mg in sodium chloride 0.9 % 250 mL IVPB     500 mg 250 mL/hr over 60 Minutes Intravenous Every 24 hours 07/15/17 0143     07/15/17 1800  cefTRIAXone (ROCEPHIN) 1 g in sodium chloride 0.9 % 100 mL IVPB     1 g 200 mL/hr over 30 Minutes Intravenous Every 24 hours 07/15/17 0143     07/15/17 1132  cefTRIAXone (ROCEPHIN) 2 g in sodium chloride 0.9 % 100 mL IVPB  Status:  Discontinued     2 g 200 mL/hr over 30 Minutes Intravenous Every 24 hours 07/15/17 1132 07/15/17 1157   07/15/17 1132  azithromycin (ZITHROMAX) 500 mg in sodium chloride 0.9 % 250 mL IVPB  Status:  Discontinued     500 mg 250 mL/hr over 60 Minutes Intravenous Every 24 hours 07/15/17 1132 07/15/17 1135   07/14/17 1845  azithromycin (ZITHROMAX) 500 mg in sodium chloride 0.9 % 250 mL IVPB     500 mg 250 mL/hr over 60 Minutes Intravenous  Once 07/14/17 1831 07/14/17 2104   07/14/17 1830  cefTRIAXone (ROCEPHIN) 1 g in sodium chloride 0.9 % 100 mL IVPB     1 g 200 mL/hr over 30 Minutes Intravenous  Once 07/14/17 1819 07/14/17 1920      Subjective:   [name] seen and examined today.  Patient with dementia.  Patient denies current chest pain, shortness of breath, abdominal pain, nausea or vomiting.  Denies further episodes of diarrhea.  Would like to eat.  Objective:   Vitals:   07/15/17 1417 07/15/17 2008 07/15/17 2229 07/16/17 0424  BP: (!) 151/52 (!) 134/41 (!) 148/43 (!) 145/43  Pulse: (!) 47 (!) 47 (!) 50 (!) 53  Resp: Temp: 98.1 F (36.7 C) 98.1 F (36.7 C)  98.1 F (36.7 C)  TempSrc: Oral Oral  Oral  SpO2: 98% 96%  97%  Weight:      Height:        Intake/Output Summary (Last  24 hours) at 07/16/2017 1104 Last data filed at 07/16/2017 1000 Gross per 24 hour  Intake 3270 ml  Output 700 ml  Net 2570 ml   Filed Weights   07/14/17 1420  Weight: 74.8 kg (165 lb)   Exam  General: Well developed, elderly, no apparent distress  HEENT: NCAT, mucous membranes moist.   Neck: Supple  Cardiovascular: S1 S2 auscultated, regular  Respiratory: Diminished breath sounds, no wheezing.  Congested cough.  Abdomen: Soft, nontender, nondistended, + bowel sounds  Extremities: warm dry without cyanosis clubbing or edema  Neuro: AAOx2, nonfocal, dementia  Psych: Appropriate mood and affect  Data Reviewed: I have personally reviewed following labs and imaging studies  CBC: Recent Labs  Lab 07/14/17 2248 07/15/17 0313 07/15/17 0632 07/15/17 1124 07/16/17 0416  WBC 9.1 8.7 9.2 9.3 8.5  HGB 8.9* 8.7* 8.3* 8.4* 8.0*  HCT 27.8* 27.1* 25.8* 26.0* 25.2*  MCV 90.8 91.2 91.5 92.2 92.6  PLT 193 183 190 190 196   Basic Metabolic Panel: Recent Labs  Lab 07/14/17 1442 07/15/17 0313 07/16/17 0416  NA 138 139 143  K 3.4* 3.4* 3.7  CL 103  105 110  CO2 22 22 21*  GLUCOSE 154* 107* 94  BUN 86* 76* 67*  CREATININE 1.87* 1.77* 1.73*  CALCIUM 8.8* 8.1* 8.4*   GFR: Estimated Creatinine Clearance: 20.6 mL/min (A) (by C-G formula based on SCr of 1.73 mg/dL (H)). Liver Function Tests: Recent Labs  Lab 07/14/17 1442 07/15/17 0313  AST 242* 211*  ALT 106* 100*  ALKPHOS 84 76  BILITOT 0.8 0.5  PROT 7.3 6.6  ALBUMIN 3.2* 3.0*   No results for input(s): LIPASE, AMYLASE in the last 168 hours. No results for input(s): AMMONIA in the last 168 hours. Coagulation Profile: Recent Labs  Lab 07/14/17 1638 07/14/17 2248 07/15/17 0313 07/15/17 0632 07/16/17 0416  INR 6.82* 6.98* 6.55* 6.01* 4.24*   Cardiac Enzymes: No results for input(s): CKTOTAL, CKMB, CKMBINDEX, TROPONINI in the last 168 hours. BNP (last 3 results) No results for input(s): PROBNP in the last 8760  hours. HbA1C: Recent Labs    07/14/17 2248  HGBA1C 5.5   CBG: Recent Labs  Lab 07/15/17 1653 07/15/17 2004 07/16/17 0012 07/16/17 0421 07/16/17 0745  GLUCAP 126* 105* 103* 99 87   Lipid Profile: No results for input(s): CHOL, HDL, LDLCALC, TRIG, CHOLHDL, LDLDIRECT in the last 72 hours. Thyroid Function Tests: No results for input(s): TSH, T4TOTAL, FREET4, T3FREE, THYROIDAB in the last 72 hours. Anemia Panel: Recent Labs    07/15/17 1438  VITAMINB12 637  FERRITIN 120  TIBC 241*  IRON 27*   Urine analysis:    Component Value Date/Time   COLORURINE YELLOW 07/14/2017 1638   APPEARANCEUR CLOUDY (A) 07/14/2017 1638   LABSPEC 1.011 07/14/2017 1638   PHURINE 5.0 07/14/2017 1638   GLUCOSEU NEGATIVE 07/14/2017 1638   HGBUR NEGATIVE 07/14/2017 1638   BILIRUBINUR NEGATIVE 07/14/2017 1638   KETONESUR NEGATIVE 07/14/2017 1638   PROTEINUR NEGATIVE 07/14/2017 1638   UROBILINOGEN 0.2 12/15/2010 1619   NITRITE POSITIVE (A) 07/14/2017 1638   LEUKOCYTESUR LARGE (A) 07/14/2017 1638   Sepsis Labs: @LABRCNTIP (procalcitonin:4,lacticidven:4)  ) Recent Results (from the past 240 hour(s))  Blood Culture (routine x 2)     Status: None (Preliminary result)   Collection Time: 07/14/17  3:23 PM  Result Value Ref Range Status   Specimen Description   Final    BLOOD LEFT FOREARM Performed at [facility], [address] [place], [place] [address]    Special Requests   Final    BOTTLES DRAWN AEROBIC AND ANAEROBIC Blood Culture results may not be optimal due to an excessive volume of blood received in culture bottles Performed at [facility], [address] [place], [place] [address]    Culture   Final    NO GROWTH < 24 HOURS Performed at [facility], [address], [place], [place] [address]    Report Status PENDING  Incomplete  Blood Culture (routine x 2)     Status: None (Preliminary result)   Collection Time: 07/14/17  4:38 PM  Result Value  Ref Range Status   Specimen Description   Final    BLOOD RIGHT ANTECUBITAL Performed at [facility], [address] [place], [place] [address]    Special Requests   Final    BOTTLES DRAWN AEROBIC AND ANAEROBIC Blood Culture results may not be optimal due to an excessive volume of blood received in culture bottles Performed at [facility], [address] [place], [place] [address]    Culture   Final    NO  GROWTH < 24 HOURS Performed at [facility], [address], [place], [place] [address]    Report Status PENDING  Incomplete  Urine Culture     Status: Abnormal (Preliminary result)   Collection Time: 07/14/17  6:20 PM  Result Value Ref Range Status   Specimen Description   Final    URINE, RANDOM Performed at [facility], [address], [place], [place] [address]    Special Requests   Final    NONE Performed at [facility], [address], [place], [place] [address]    Culture >=100,000 COLONIES/mL GRAM NEGATIVE RODS (A)  Final   Report Status PENDING  Incomplete      Radiology Studies: Ct Abdomen Pelvis Wo Contrast  Result Date: 07/14/2017 CLINICAL DATA:  Cough, congestion and fatigue for 6 days. History of hysterectomy. EXAM: CT ABDOMEN AND PELVIS WITHOUT CONTRAST TECHNIQUE: Multidetector CT imaging of the abdomen and pelvis was performed following the standard protocol without IV contrast. COMPARISON:  None. FINDINGS: LOWER CHEST: Small bilateral pleural effusions. Included heart is enlarged. Trace pericardial effusion. HEPATOBILIARY: Distended gallbladder with 2.6 cm gallstone. Negative noncontrast CT liver. PANCREAS: Normal. SPLEEN: Normal. ADRENALS/URINARY TRACT: Kidneys are orthotopic, demonstrating normal size and morphology. No nephrolithiasis, hydronephrosis; limited assessment for renal masses on this nonenhanced examination. RIGHT pelviectasis. The unopacified ureters are normal in  course and caliber. Urinary bladder is partially distended and unremarkable. Normal adrenal glands. STOMACH/BOWEL: The stomach, small and large bowel are normal in course and caliber. Severe descending and sigmoid colonic diverticulosis. Ascending and transverse colon air contrast levels. Normal appendix. VASCULAR/LYMPHATIC: Aortoiliac vessels are normal in course and caliber. Moderate to severe calcific atherosclerosis. No lymphadenopathy by CT size criteria. REPRODUCTIVE: Status post hysterectomy. OTHER: No intraperitoneal free fluid or free air. MUSCULOSKELETAL: Non-acute. Status post anterior abdominal wall herniorrhaphy. Moderate sacroiliac osteoarthrosis. Severe lower lumbar facet arthropathy. Grade 1 L4-5 anterolisthesis without spondylolysis. Moderate to severe L4-5 neural foraminal narrowing. IMPRESSION: 1. Cholelithiasis and gallbladder distension without additional findings of acute cholecystitis. 2. Colonic air contrast levels, possible enteritis. Colonic diverticulosis. 3. Cardiomegaly and small pleural effusions. Aortic Atherosclerosis (ICD10-I70.0). Electronically Signed   By: Awilda Metro M.D.   On: 07/14/2017 17:39   Dg Chest 2 View  Result Date: 07/14/2017 CLINICAL DATA:  Cough and congestion with fatigue 6 days. EXAM: CHEST - 2 VIEW COMPARISON:  08/19/2009 FINDINGS: Lungs are adequately inflated with mild prominence of the perihilar markings with middle patchy density in the left perihilar region and left base. Small left pleural effusion. Moderate cardiomegaly. Degenerative change of the spine. IMPRESSION: Findings suggesting mild CHF. Possible infection over the left mid to lower lung. Electronically Signed   By: Elberta Fortis M.D.   On: 07/14/2017 15:18     Scheduled Meds: . allopurinol  100 mg Oral Daily  . amLODipine  10 mg Oral QHS  . atorvastatin  20 mg Oral QHS  . dextromethorphan-guaiFENesin  1 tablet Oral BID  . fluticasone  2 spray Each Nare Daily  . furosemide  40 mg  Oral Daily  . hydrALAZINE  100 mg Oral TID  . insulin aspart  0-15 Units Subcutaneous Q4H  . levothyroxine  50 mcg Oral Daily  . loratadine  10 mg Oral Daily  . multivitamin with minerals   Oral Daily  . [START ON 07/18/2017] pantoprazole  40 mg Intravenous Q12H   Continuous Infusions: . azithromycin Stopped (07/15/17 2210)  . cefTRIAXone (ROCEPHIN)  IV Stopped (07/15/17 1823)  .  pantoprozole (PROTONIX) infusion 8 mg/hr (07/16/17 0509)     LOS: 2 days   Time Spent in minutes   30 minutes  Lavanna Rog D.O. on 07/16/2017 at 11:04 AM  Between 7am to 7pm - Pager - [phone]  After 7pm go to [email or web address] - password [email or web address]  And look for the night coverage person covering for me after hours  [facility] Office  [phone]

## 2017-07-16 NOTE — Progress Notes (Signed)
  Echocardiogram 2D Echocardiogram has been performed.  Tyrick Dunagan G Jaymin Waln 07/16/2017, 3:04 PM

## 2017-07-16 NOTE — Progress Notes (Signed)
PT Cancellation Note  Patient Details Name: [name] MRN: [id number] DOB: 11/07/1929   Cancelled Treatment:    Reason Eval/Treat Not Completed: Medical issues which prohibited therapy;Patient at procedure or test/unavailable Noted INR high,  This AM,  Getting test this PM.  Check back tomorrow.  Rada Hay 07/16/2017, 3:12 PM  Blanchard Kelch PT [phone]

## 2017-07-16 NOTE — Progress Notes (Signed)
CRITICAL VALUE ALERT  Critical Value:  INR 4.24  Date & Time Notied:  07/16/17 0505  Provider Notified: Craige Cotta 0510  Orders Received/Actions taken: None. Waiting on orders

## 2017-07-17 DIAGNOSIS — I5033 Acute on chronic diastolic (congestive) heart failure: Secondary | ICD-10-CM

## 2017-07-17 LAB — BASIC METABOLIC PANEL
ANION GAP: 10 (ref 5–15)
BUN: 59 mg/dL — ABNORMAL HIGH (ref 6–20)
CHLORIDE: 109 mmol/L (ref 101–111)
CO2: 21 mmol/L — AB (ref 22–32)
Calcium: 8 mg/dL — ABNORMAL LOW (ref 8.9–10.3)
Creatinine, Ser: 1.71 mg/dL — ABNORMAL HIGH (ref 0.44–1.00)
GFR calc Af Amer: 30 mL/min — ABNORMAL LOW (ref >60)
GFR calc non Af Amer: 26 mL/min — ABNORMAL LOW (ref >60)
Glucose, Bld: 95 mg/dL (ref 65–99)
Potassium: 4 mmol/L (ref 3.5–5.1)
SODIUM: 140 mmol/L (ref 135–145)

## 2017-07-17 LAB — CBC
HEMATOCRIT: 25.7 % — AB (ref 36.0–46.0)
HEMOGLOBIN: 8.4 g/dL — AB (ref 12.0–15.0)
MCH: 29.7 pg (ref 26.0–34.0)
MCHC: 32.7 g/dL (ref 30.0–36.0)
MCV: 90.8 fL (ref 78.0–100.0)
Platelets: 199 10*3/uL (ref 150–400)
RBC: 2.83 MIL/uL — AB (ref 3.87–5.11)
RDW: 17.1 % — ABNORMAL HIGH (ref 11.5–15.5)
WBC: 7.4 10*3/uL (ref 4.0–10.5)

## 2017-07-17 LAB — GLUCOSE, CAPILLARY
GLUCOSE-CAPILLARY: 110 mg/dL — AB (ref 65–99)
Glucose-Capillary: 107 mg/dL — ABNORMAL HIGH (ref 65–99)
Glucose-Capillary: 102 mg/dL — ABNORMAL HIGH (ref 65–99)
Glucose-Capillary: 178 mg/dL — ABNORMAL HIGH (ref 65–99)
Glucose-Capillary: 174 mg/dL — ABNORMAL HIGH (ref 65–99)
Glucose-Capillary: 135 mg/dL — ABNORMAL HIGH (ref 65–99)

## 2017-07-17 LAB — URINE CULTURE: Culture: >=100000 — AB

## 2017-07-17 LAB — PROTIME-INR
INR: 5.2 — AB
PROTHROMBIN TIME: 47.5 s — AB (ref 11.4–15.2)

## 2017-07-17 MED ORDER — PANTOPRAZOLE SODIUM 40 MG PO TBEC
40.0000 mg | DELAYED_RELEASE_TABLET | Freq: Two times a day (BID) | ORAL | Status: DC
  Administered 2017-07-17 – 2017-07-22 (×10): 40 mg via ORAL
  Filled 2017-07-17 (×10): qty 1

## 2017-07-17 NOTE — Evaluation (Signed)
Physical Therapy Evaluation Patient Details Name: [name] MRN: [id number] DOB: 12/15/1929 Today's Date: 07/17/2017   History of Present Illness  84 y.o. female with a known history of afib on Coumadin, CHD, CKD3, DM2, HTN, GERD, HLD and admitted with CAP, UTI, GI bleed with supratherapeutic INR  Clinical Impression  Pt admitted with above diagnosis. Pt currently with functional limitations due to the deficits listed below (see PT Problem List). Pt will benefit from skilled PT to increase their independence and safety with mobility to allow discharge to the venue listed below.  Pt reports being very tired however agreeable to mobilize.  Pt only felt able to tolerate standing at EOB however performed 4x.  Pt's SPO2 dropped to 87% on room air sitting EOB so reapplied 2L O2 [place] for transferring (remained 93-97% on 2L).  Pt reports dyspnea with little activity so provided rest breaks and encouraged breathing. Pt's daughter reports she is having difficulty assisting pt at home.  Recommend SNF upon d/c at this time.     Follow Up Recommendations SNF    Equipment Recommendations  None recommended by PT    Recommendations for Other Services       Precautions / Restrictions Precautions Precautions: Fall Precaution Comments: monitor sats      Mobility  Bed Mobility Overal bed mobility: Needs Assistance Bed Mobility: Supine to Sit;Sit to Supine     Supine to sit: Mod assist Sit to supine: Mod assist   General bed mobility comments: assist for trunk upright and LEs onto bed  Transfers Overall transfer level: Needs assistance Equipment used: Rolling walker (2 wheeled) Transfers: Sit to/from Stand Sit to Stand: Min assist         General transfer comment: verbal cues for technique, pt performed sit to stands x2 and standing hold for approx 30 seconds, pt then performed stand with march in place approx 1 minute x2  Ambulation/Gait             General Gait Details: pt felt  unable to ambulate today  Stairs            Wheelchair Mobility    Modified Rankin (Stroke Patients Only)       Balance Overall balance assessment: History of Falls;Mild deficits observed, not formally tested                                           Pertinent Vitals/Pain Pain Assessment: No/denies pain    Home Living Family/patient expects to be discharged to:: Private residence Living Arrangements: Children Available Help at Discharge: Family Type of Home: House Home Access: Level entry     Home Layout: One level Home Equipment: Environmental consultant - 2 wheels;Cane - single point      Prior Function Level of Independence: Independent with assistive device(s)         Comments: typically uses RW, daughter reports she has been having to assist pt with dressing and bathing, also requiring more physical assist prior to admission     Hand Dominance        Extremity/Trunk Assessment        Lower Extremity Assessment Lower Extremity Assessment: Generalized weakness       Communication   Communication: No difficulties  Cognition Arousal/Alertness: Awake/alert Behavior During Therapy: WFL for tasks assessed/performed Overall Cognitive Status: Within Functional Limits for tasks assessed  General Comments      Exercises     Assessment/Plan    PT Assessment Patient needs continued PT services  PT Problem List Decreased mobility;Decreased strength;Decreased activity tolerance;Decreased balance;Decreased knowledge of use of DME;Pain;Cardiopulmonary status limiting activity       PT Treatment Interventions DME instruction;Therapeutic activities;Gait training;Therapeutic exercise;Patient/family education;Functional mobility training;Balance training    PT Goals (Current goals can be found in the Care Plan section)  Acute Rehab PT Goals PT Goal Formulation: With patient Time For Goal  Achievement: 07/31/17 Potential to Achieve Goals: Good    Frequency Min 3X/week   Barriers to discharge        Co-evaluation               AM-PAC PT "6 Clicks" Daily Activity  Outcome Measure Difficulty turning over in bed (including adjusting bedclothes, sheets and blankets)?: A Lot Difficulty moving from lying on back to sitting on the side of the bed? : Unable Difficulty sitting down on and standing up from a chair with arms (e.g., wheelchair, bedside commode, etc,.)?: Unable Help needed moving to and from a bed to chair (including a wheelchair)?: A Lot Help needed walking in hospital room?: Total Help needed climbing 3-5 steps with a railing? : Total 6 Click Score: 8    End of Session Equipment Utilized During Treatment: Gait belt;Oxygen Activity Tolerance: Patient limited by fatigue Patient left: in bed;with bed alarm set;with call bell/phone within reach;with family/visitor present Nurse Communication: Mobility status PT Visit Diagnosis: Other abnormalities of gait and mobility (R26.89)    Time: 1610-9604 PT Time Calculation (min) (ACUTE ONLY): 24 min   Charges:   PT Evaluation $PT Eval Moderate Complexity: 1 Mod     PT G CodesZenovia Jarred, PT, DPT 07/17/2017 Pager: [phone]  Maida Sale E 07/17/2017, 1:21 PM

## 2017-07-17 NOTE — Progress Notes (Signed)
INR critical lab of 5.20 called in.  MD paged with results.

## 2017-07-17 NOTE — Care Management Important Message (Signed)
Important Message  Patient Details  Name: [name] MRN: [id number] Date of Birth: September 02, 1929   Medicare Important Message Given:  Yes    Caren Macadam 07/17/2017, 1:18 PMImportant Message  Patient Details  Name: [name] MRN: [id number] Date of Birth: August 04, 1929   Medicare Important Message Given:  Yes    Caren Macadam 07/17/2017, 1:17 PM

## 2017-07-17 NOTE — Progress Notes (Signed)
Patient ID: [name], female   DOB: 05-Apr-1929, 84 y.o.   MRN: [id number]  PROGRESS NOTE    JERNI SELMER  WUJ:[id number] DOB: 02-26-1930 DOA: 07/14/2017 PCP: Sheliah Hatch, MD   Brief Narrative:  84 year old female with history of atrial fibrillation on Coumadin, CKD 3, diabetes mellitus type 2, hypertension, GERD, hyperlipidemia, question of CHF presented on 07/14/2017 with for evaluation of fever along with dark stools.  Patient was admitted for multiple medical problems including committee acquired pneumonia, UTI, GI bleed with subtherapeutic INR along with possibly diastolic CHF exacerbation.  Assessment & Plan:   Active Problems:   Essential hypertension   GERD   Atrial fibrillation (HCC)   Anemia   Hyperlipidemia   Chronic diastolic CHF (congestive heart failure), NYHA class 1 (HCC)   Chronic diarrhea   Dementia   Community acquired pneumonia   GI bleed   Supratherapeutic INR   Acute UTI   AKI (acute kidney injury) (HCC)   Cough   Dehydration   Warfarin-induced coagulopathy (HCC)   Community-acquired pneumonia -Blood cultures negative so far.  Currently on Zithromax and Rocephin -Clear 2 L nasal cannula oxygen -Repeat chest x-ray in a.m.  UTI with Klebsiella oxytoca -Continue Rocephin  Acute on chronic normocytic anemia with acute blood loss anemia -Hemoglobin 8.4 today.  Transfuse if hemoglobin less than 7  Probable upper GI bleeding presenting with dark stools -Currently on Protonix IV.  Hemoglobin stable.  Will switch Protonix to oral.  Prior hospitalist discussed with patient's daughter regarding endoscopy or colonoscopy.  Patient's daughter did not wish to have any procedures for now -We will call palliative care consult for goals of care  Supratherapeutic INR -INR is still elevated.  Hold Coumadin.  Repeat a.m. labs  Probable acute diastolic CHF -Echo showed EF of 55 to 60% with grade 2 diastolic dysfunction -Strict input and output.  Daily  weights.  Continue Lasix 40 mill grams daily.  Repeat chest x-ray in a.m. continue hydralazine  Chronic kidney disease stage IV -Creatinine stable.  Monitor  Essential hypertension -Monitor blood pressure.  Continue amlodipine and hydralazine with holding parameters  Hyperlipidemia -Continue statin  Hypothyroidism -Continue Synthroid  Diabetes mellitus type 2 -Diet controlled.  Continue Accu-Cheks  dementia -Stable.  Fall precautions  Chronic atrial fibrillation -Currently rate controlled.  Coumadin on hold     DVT prophylaxis: Coumadin on hold Code Status: DNR Family Communication: None at bedside Disposition Plan: Probably will need nursing home  Consultants: None Procedures:  Echo - Left ventricle: The cavity size was normal. Systolic function was   normal. The estimated ejection fraction was in the range of 55%   to 60%. Wall motion was normal; there were no regional wall   motion abnormalities. Features are consistent with a pseudonormal   left ventricular filling pattern, with concomitant abnormal   relaxation and increased filling pressure (grade 2 diastolic   dysfunction). - Mitral valve: There was mild regurgitation. - Left atrium: The atrium was severely dilated. - Right atrium: The atrium was severely dilated. - Pulmonary arteries: Systolic pressure was moderately to severely   increased. PA peak pressure: 66 mm Hg (S). - Pericardium, extracardiac: A trivial pericardial effusion was   identified posterior to the heart.  Antimicrobials:  Rocephin and Zithromax from 07/14/2017 onwards   Subjective: Patient seen and examined at bedside.  She feels very weak and tired.  No overnight fever, nausea or vomiting.  Objective: Vitals:   07/16/17 2142 07/17/17 0549 07/17/17 0912 07/17/17  1406  BP: (!) 130/42 (!) 152/39 (!) 141/47 (!) 148/40  Pulse: (!) 47 (!) 47 (!) 55 (!) 41  Resp: Temp: 98 F (36.7 C) 98 F (36.7 C)  98.9 F (37.2 C)    TempSrc: Oral Oral  Oral  SpO2: 96% 95% 96% 97%  Weight:      Height:        Intake/Output Summary (Last 24 hours) at 07/17/2017 1417 Last data filed at 07/17/2017 1052 Gross per 24 hour  Intake 1070 ml  Output 1000 ml  Net 70 ml   Filed Weights   07/14/17 1420  Weight: 74.8 kg (165 lb)    Examination:  General exam: Elderly female lying in bed.  No distress Respiratory system: Bilateral decreased breath sound at bases with scattered crackles Cardiovascular system: S1 & S2 heard, rate controlled  gastrointestinal system: Abdomen is nondistended, soft and nontender. Normal bowel sounds heard. Extremities: No cyanosis; trace edema    Data Reviewed: I have personally reviewed following labs and imaging studies  CBC: Recent Labs  Lab 07/15/17 0313 07/15/17 0632 07/15/17 1124 07/15/17 1438 07/16/17 0416 07/17/17 0414  WBC 8.7 9.2 9.3  --  8.5 7.4  HGB 8.7* 8.3* 8.4*  --  8.0* 8.4*  HCT 27.1* 25.8* 26.0* 25.8* 25.2* 25.7*  MCV 91.2 91.5 92.2  --  92.6 90.8  PLT 183 190 190  --  196 199   Basic Metabolic Panel: Recent Labs  Lab 07/14/17 1442 07/15/17 0313 07/16/17 0416 07/17/17 0414  NA 138 139 143 140  K 3.4* 3.4* 3.7 4.0  CL 103 105 110 109  CO2 22 22 21* 21*  GLUCOSE 154* 107* 94 95  BUN 86* 76* 67* 59*  CREATININE 1.87* 1.77* 1.73* 1.71*  CALCIUM 8.8* 8.1* 8.4* 8.0*   GFR: Estimated Creatinine Clearance: 20.8 mL/min (A) (by C-G formula based on SCr of 1.71 mg/dL (H)). Liver Function Tests: Recent Labs  Lab 07/14/17 1442 07/15/17 0313  AST 242* 211*  ALT 106* 100*  ALKPHOS 84 76  BILITOT 0.8 0.5  PROT 7.3 6.6  ALBUMIN 3.2* 3.0*   No results for input(s): LIPASE, AMYLASE in the last 168 hours. No results for input(s): AMMONIA in the last 168 hours. Coagulation Profile: Recent Labs  Lab 07/14/17 2248 07/15/17 0313 07/15/17 4332 07/16/17 0416 07/17/17 0414  INR 6.98* 6.55* 6.01* 4.24* 5.20*   Cardiac Enzymes: No results for input(s):  CKTOTAL, CKMB, CKMBINDEX, TROPONINI in the last 168 hours. BNP (last 3 results) No results for input(s): PROBNP in the last 8760 hours. HbA1C: Recent Labs    07/14/17 2248  HGBA1C 5.5   CBG: Recent Labs  Lab 07/16/17 1932 07/16/17 2319 07/17/17 0343 07/17/17 0724 07/17/17 1201  GLUCAP 195* 150* 107* 102* 178*   Lipid Profile: No results for input(s): CHOL, HDL, LDLCALC, TRIG, CHOLHDL, LDLDIRECT in the last 72 hours. Thyroid Function Tests: No results for input(s): TSH, T4TOTAL, FREET4, T3FREE, THYROIDAB in the last 72 hours. Anemia Panel: Recent Labs    07/15/17 1438  VITAMINB12 637  FERRITIN 120  TIBC 241*  IRON 27*   Sepsis Labs: Recent Labs  Lab 07/14/17 1456 07/14/17 1639  LATICACIDVEN 2.36* 1.00    Recent Results (from the past 240 hour(s))  Blood Culture (routine x 2)     Status: None (Preliminary result)   Collection Time: 07/14/17  3:23 PM  Result Value Ref Range Status   Specimen Description   Final  BLOOD LEFT FOREARM Performed at [facility], [address], [place], [place] [address]    Special Requests   Final    BOTTLES DRAWN AEROBIC AND ANAEROBIC Blood Culture results may not be optimal due to an excessive volume of blood received in culture bottles Performed at [facility], [address] [place], [place] [address]    Culture   Final    NO GROWTH 2 DAYS Performed at [facility], [address] [place], [place] [address]    Report Status PENDING  Incomplete  Blood Culture (routine x 2)     Status: None (Preliminary result)   Collection Time: 07/14/17  4:38 PM  Result Value Ref Range Status   Specimen Description   Final    BLOOD RIGHT ANTECUBITAL Performed at [facility], [address], [place], [place] [address]    Special Requests   Final    BOTTLES DRAWN AEROBIC AND ANAEROBIC Blood Culture results may not be optimal due to an excessive volume of blood received in culture  bottles Performed at [facility], [address] [place], [place] [address]    Culture   Final    NO GROWTH 2 DAYS Performed at [facility], [address] [place], [place] [address]    Report Status PENDING  Incomplete  Urine Culture     Status: Abnormal   Collection Time: 07/14/17  6:20 PM  Result Value Ref Range Status   Specimen Description   Final    URINE, RANDOM Performed at [facility], [address], [place], [place] [address]    Special Requests   Final    NONE Performed at [facility], [address] [place], [place] [address]    Culture >=100,000 COLONIES/mL KLEBSIELLA OXYTOCA (A)  Final   Report Status 07/17/2017 FINAL  Final   Organism ID, Bacteria KLEBSIELLA OXYTOCA (A)  Final      Susceptibility   Klebsiella oxytoca - MIC*    AMPICILLIN >=32 RESISTANT Resistant     CEFAZOLIN 8 SENSITIVE Sensitive     CEFTRIAXONE <=1 SENSITIVE Sensitive     CIPROFLOXACIN <=0.25 SENSITIVE Sensitive     GENTAMICIN <=1 SENSITIVE Sensitive     IMIPENEM <=0.25 SENSITIVE Sensitive     NITROFURANTOIN <=16 SENSITIVE Sensitive     TRIMETH/SULFA <=20 SENSITIVE Sensitive     AMPICILLIN/SULBACTAM 8 SENSITIVE Sensitive     PIP/TAZO <=4 SENSITIVE Sensitive     Extended ESBL NEGATIVE Sensitive     * >=100,000 COLONIES/mL KLEBSIELLA OXYTOCA         Radiology Studies: No results found.      Scheduled Meds: . allopurinol  100 mg Oral Daily  . amLODipine  10 mg Oral QHS  . atorvastatin  20 mg Oral QHS  . dextromethorphan-guaiFENesin  1 tablet Oral BID  . feeding supplement  1 Container Oral TID BM  . fluticasone  2 spray Each Nare Daily  . furosemide  40 mg Oral Daily  . hydrALAZINE  100 mg Oral TID  . insulin aspart  0-15 Units Subcutaneous Q4H  . levothyroxine  50 mcg Oral Daily  . loratadine  10 mg Oral Daily  . multivitamin with minerals   Oral Daily  . [START ON 07/18/2017] pantoprazole  40 mg  Intravenous Q12H   Continuous Infusions: . azithromycin Stopped (07/16/17 2210)  . cefTRIAXone (ROCEPHIN)  IV 1 g (07/16/17 1732)  . pantoprozole (PROTONIX)  infusion 8 mg/hr (07/17/17 1052)     LOS: 3 days        Glade Lloyd, MD [facility] Pager [phone]  If 7PM-7AM, please contact night-coverage [email or web address] Password [facility] 07/17/2017, 2:17 PM

## 2017-07-18 ENCOUNTER — Inpatient Hospital Stay (HOSPITAL_COMMUNITY): Payer: Medicare HMO

## 2017-07-18 DIAGNOSIS — Z7189 Other specified counseling: Secondary | ICD-10-CM

## 2017-07-18 DIAGNOSIS — T45515A Adverse effect of anticoagulants, initial encounter: Secondary | ICD-10-CM

## 2017-07-18 DIAGNOSIS — G309 Alzheimer's disease, unspecified: Secondary | ICD-10-CM

## 2017-07-18 DIAGNOSIS — I4891 Unspecified atrial fibrillation: Secondary | ICD-10-CM

## 2017-07-18 DIAGNOSIS — Z515 Encounter for palliative care: Secondary | ICD-10-CM

## 2017-07-18 DIAGNOSIS — D6832 Hemorrhagic disorder due to extrinsic circulating anticoagulants: Secondary | ICD-10-CM

## 2017-07-18 DIAGNOSIS — I5032 Chronic diastolic (congestive) heart failure: Secondary | ICD-10-CM

## 2017-07-18 DIAGNOSIS — J181 Lobar pneumonia, unspecified organism: Secondary | ICD-10-CM

## 2017-07-18 DIAGNOSIS — F0280 Dementia in other diseases classified elsewhere without behavioral disturbance: Secondary | ICD-10-CM

## 2017-07-18 DIAGNOSIS — N390 Urinary tract infection, site not specified: Secondary | ICD-10-CM

## 2017-07-18 LAB — BASIC METABOLIC PANEL
Anion gap: 10 (ref 5–15)
BUN: 50 mg/dL — AB (ref 6–20)
CALCIUM: 8.1 mg/dL — AB (ref 8.9–10.3)
CO2: 22 mmol/L (ref 22–32)
CREATININE: 1.62 mg/dL — AB (ref 0.44–1.00)
Chloride: 108 mmol/L (ref 101–111)
GFR calc Af Amer: 32 mL/min — ABNORMAL LOW (ref >60)
GFR, EST NON AFRICAN AMERICAN: 28 mL/min — AB (ref >60)
GLUCOSE: 108 mg/dL — AB (ref 65–99)
Potassium: 3.3 mmol/L — ABNORMAL LOW (ref 3.5–5.1)
Sodium: 140 mmol/L (ref 135–145)

## 2017-07-18 LAB — GLUCOSE, CAPILLARY
GLUCOSE-CAPILLARY: 174 mg/dL — AB (ref 65–99)
GLUCOSE-CAPILLARY: 160 mg/dL — AB (ref 65–99)
Glucose-Capillary: 141 mg/dL — ABNORMAL HIGH (ref 65–99)
Glucose-Capillary: 109 mg/dL — ABNORMAL HIGH (ref 65–99)
Glucose-Capillary: 151 mg/dL — ABNORMAL HIGH (ref 65–99)

## 2017-07-18 LAB — CBC WITH DIFFERENTIAL/PLATELET
BASOS PCT: 0 %
Basophils Absolute: 0 10*3/uL (ref 0.0–0.1)
EOS PCT: 3 %
Eosinophils Absolute: 0.2 10*3/uL (ref 0.0–0.7)
HEMATOCRIT: 24.5 % — AB (ref 36.0–46.0)
Hemoglobin: 7.9 g/dL — ABNORMAL LOW (ref 12.0–15.0)
LYMPHS PCT: 9 %
Lymphs Abs: 0.6 10*3/uL — ABNORMAL LOW (ref 0.7–4.0)
MCH: 29.5 pg (ref 26.0–34.0)
MCHC: 32.2 g/dL (ref 30.0–36.0)
MCV: 91.4 fL (ref 78.0–100.0)
MONO ABS: 0.8 10*3/uL (ref 0.1–1.0)
Monocytes Relative: 11 %
NEUTROS ABS: 5.3 10*3/uL (ref 1.7–7.7)
Neutrophils Relative %: 77 %
Platelets: 180 10*3/uL (ref 150–400)
RBC: 2.68 MIL/uL — ABNORMAL LOW (ref 3.87–5.11)
RDW: 17 % — AB (ref 11.5–15.5)
WBC: 7 10*3/uL (ref 4.0–10.5)

## 2017-07-18 LAB — MAGNESIUM: Magnesium: 2.1 mg/dL (ref 1.7–2.4)

## 2017-07-18 LAB — PROTIME-INR
INR: 5.13
Prothrombin Time: 47 seconds — ABNORMAL HIGH (ref 11.4–15.2)

## 2017-07-18 MED ORDER — FUROSEMIDE 10 MG/ML IJ SOLN
40.0000 mg | Freq: Two times a day (BID) | INTRAMUSCULAR | Status: DC
  Administered 2017-07-18 – 2017-07-20 (×4): 40 mg via INTRAVENOUS
  Filled 2017-07-18 (×4): qty 4

## 2017-07-18 MED ORDER — POTASSIUM CHLORIDE CRYS ER 20 MEQ PO TBCR
60.0000 meq | EXTENDED_RELEASE_TABLET | Freq: Once | ORAL | Status: AC
  Administered 2017-07-18: 60 meq via ORAL
  Filled 2017-07-18: qty 3

## 2017-07-18 NOTE — Progress Notes (Signed)
Critical lab INR  5.13 MD notified.

## 2017-07-18 NOTE — Consult Note (Signed)
Consultation Note Date: 07/18/17  Patient Name: [name]  DOB: [birth year]  MRN: [id number]  Age / Sex: 84 y.o., female  PCP: Kathryn Minium, MD Referring Physician: Aline August, MD  Reason for Consultation: Establishing goals of care  HPI/Patient Profile: 84 y.o. female with past medical history of dementia, atrial fibrillation on coumadin, CHF, CKD, DM type 2, GERD, obesity admitted on 07/14/2017 with fever, weakness, and dark stools. Patient admitted for community acquired pneumonia, UTI, GI bleeding with subtherapeutic INR, and possible diastolic CHF exacerbation. Patient treated with 5 day course of rocephin and zithromax (discontinued on 5/23). ECHO showed EF of 55-60% with grade 2 diastolic dysfunction. Receiving IV lasix. Palliative medicine consultation for goals of care.   Clinical Assessment and Goals of Care: I have reviewed medical records, discussed with care team, and met with patient and daughter [name]) at bedside to discuss diagnosis, prognosis, GOC, EOL wishes, disposition and options.  Introduced Palliative Medicine as specialized medical care for people living with serious illness. It focuses on providing relief from the symptoms and stress of a serious illness. The goal is to improve quality of life for both the patient and the family.  We discussed a brief life review of the patient. 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 The patient lives with [name], who is most involved in her care. Diagnosed with dementia about two years ago. [name] speaks of a great decline in her functional status since December. She requires assist with ADL's and sleeps majority of the day. Appetite fair but poor in the last week.   Discussed hospital diagnoses and interventions. Educated on disease trajectory of progressive dementia. [name] is surprised to hear infection is common in dementia  patients. Hard Choices copy given for review. Explained high risk for recurrent hospitalization due to infection, dehydration, poor nutritional status secondary to progressive dementia.   I attempted to elicit values and goals of care important to the patient and daughter. [name] speaks of NOT wanting to pursue aggressive interventions such as EGD or colonoscopy unless absolutely necessary. We did discuss risks/benefits of [name] to stop coumadin. She speaks of wanting "comfort" for her mother.   Advanced directives, concepts specific to code status, artifical feeding and hydration, and rehospitalization were considered and discussed. [name] tells me her mother has a living will with her and her [name] as documented HCPOA. [name] defers decision making to [name] because she is local and he also is caring for his ill mother-in-law. [name] confirms the patients wishes against heroic measures including resuscitation, life support, feeding tube.   Introduced and discussed MOST form with [name]. DNR/DNI, limited interventions, antibiotics and IVF if indicated, and no feeding tube.   At this point, [name] is most concerned about discharge plan. She speaks of the challenges for caring for her mother with progressive dementia. She wishes to keep her home as long as possible, but also believes she is reaching a point that she needs higher level of care. We discussed  her ability to participate in therapy and concern for continued decline with progressing dementia, heart failure, two infections, and GIB/anemia. I explained again high risk for recurrent hospitalization and guarded prognosis.  Palliative Care and hospice services outpatient were explained and offered. Educated on hospice philosophy. I did explain to [name] that I feel her mother is eligible for hospice services <6 months and even may be approaching eligibility for inpatient hospice due to poor nutritional status. She has been  eating very poorly this hospitalization. [name] confirms understanding. At this point, she is hopeful a SNF will accept her mother to attempt physical therapy. [name] understands [name] can transition to hospice services if she continues to decline.   Questions and concerns were addressed. PMT contact information given.    SUMMARY OF RECOMMENDATIONS    Initial palliative discussion with daughter.  Discussed and completed MOST form. Daughter confirms patient's wishes against heroic measures at EOL. MOST form: DNR/DNI, limited interventions, antibiotics/IVF if indicated, and no feeding tube.   Daughter declines EGD/Colonscopy for GIB. Agreeable to stop coumadin in the future if recommended by attending.   Discussed dementia disease trajectory and high risk for recurrent hospitalization secondary to progressive dementia.   Daughter hopeful for discharge to SNF for rehab. She is agreeable with outpatient palliative services to follow at SNF. Daughter understands this can transition to hospice services in the near future if she continues to decline.   PMT will follow.  Code Status/Advance Care Planning:  DNR  Symptom Management:   Per attending  Palliative Prophylaxis:   Aspiration, Delirium Protocol, Frequent Pain Assessment, Oral Care and Turn Reposition  Psycho-social/Spiritual:   Desire for further Chaplaincy support:yes  Additional Recommendations: Caregiving  Support/Resources and Education on Hospice  Prognosis:   Unable to determine: guarded to poor with hospitalization secondary to UTI and CAP, diastolic heart failure, GIB with anemia, and with underlying progressive dementia. Poor nutritional and functional status. High risk for readmission.   Discharge Planning: [place] for rehab with Palliative care service follow-up      Primary Diagnoses: Present on Admission: . Community acquired pneumonia . Hyperlipidemia . GERD . Essential hypertension .  Dementia . Chronic diastolic CHF (congestive heart failure), NYHA class 1 ([place]) . Chronic diarrhea . Atrial fibrillation ([place]) . Anemia   I have reviewed the medical record, interviewed the patient and family, and examined the patient. The following aspects are pertinent.  Past Medical History:  Diagnosis Date  . Atrial fibrillation ([place])   . CHF (congestive heart failure) ([place])   . CKD (chronic kidney disease)    stage 3  . DIABETES-TYPE 2    12/12/15 dgtr unsure  . DYSPNEA ON EXERTION   . Edema   . GERD   . HYPERTENSION   . Intrinsic asthma, unspecified   . KNEE PAIN   . OBESITY   . Other and unspecified hyperlipidemia    Social History   Socioeconomic History  . Marital status: Divorced    Spouse name: Not on file  . Number of children: 3  . Years of education: 50  . Highest education level: Not on file  Occupational History    Comment: retired  Scientific laboratory technician  . Financial resource strain: Not on file  . Food insecurity:    Worry: Not on file    Inability: Not on file  . Transportation needs:    Medical: Not on file    Non-medical: Not on file  Tobacco Use  . Smoking status: Never Smoker  .  Smokeless tobacco: Never Used  Substance and Sexual Activity  . Alcohol use: No  . Drug use: No  . Sexual activity: Never  Lifestyle  . Physical activity:    Days per week: Not on file    Minutes per session: Not on file  . Stress: Not on file  Relationships  . Social connections:    Talks on phone: Not on file    Gets together: Not on file    Attends religious service: Not on file    Active member of club or organization: Not on file    Attends meetings of clubs or organizations: Not on file    Relationship status: Not on file  Other Topics Concern  . Not on file  Social History Narrative   04/17/16 Lives with daughter   No caffeine use   Family History  Problem Relation Age of Onset  . Coronary artery disease Mother   . Hypertension Mother   . Stroke  Mother   . Heart attack Mother   . Hypertension Father   . Prostate cancer Father   . Stroke Father   . Heart attack Father   . Cancer [name]        throat   Scheduled Meds: . allopurinol  100 mg Oral Daily  . amLODipine  10 mg Oral QHS  . atorvastatin  20 mg Oral QHS  . dextromethorphan-guaiFENesin  1 tablet Oral BID  . feeding supplement  1 Container Oral TID BM  . fluticasone  2 spray Each Nare Daily  . furosemide  40 mg Intravenous BID  . hydrALAZINE  100 mg Oral TID  . insulin aspart  0-15 Units Subcutaneous Q4H  . levothyroxine  50 mcg Oral Daily  . loratadine  10 mg Oral Daily  . multivitamin with minerals   Oral Daily  . pantoprazole  40 mg Oral BID AC   Continuous Infusions: . azithromycin Stopped (07/17/17 2210)  . cefTRIAXone (ROCEPHIN)  IV Stopped (07/17/17 1741)   PRN Meds:.acetaminophen **OR** acetaminophen, guaiFENesin, ipratropium-albuterol, ondansetron **OR** ondansetron (ZOFRAN) IV, oxyCODONE Medications Prior to Admission:  Prior to Admission medications   Medication Sig Start Date End Date Taking? Authorizing Provider  acidophilus (RISAQUAD) CAPS capsule Take 1 capsule by mouth daily.   Yes [provider]  albuterol (VENTOLIN HFA) 108 (90 Base) MCG/ACT inhaler INHALE TWO PUFFS EVERY 4 HOURS AS NEEDED FOR COUGH AND  WHEEZING 01/16/16  Yes Brunetta Jeans, PA-C  allopurinol (ZYLOPRIM) 100 MG tablet TAKE 1 TABLET BY MOUTH ONCE DAILY 04/15/17  Yes Kathryn Minium, MD  amLODipine (NORVASC) 10 MG tablet Take 10 mg by mouth at bedtime.  06/28/17  Yes [provider]  cetirizine (ZYRTEC) 10 MG tablet Take 10 mg by mouth daily.   Yes [provider]  fluticasone (FLONASE) 50 MCG/ACT nasal spray Place 2 sprays into both nostrils daily. 01/16/16  Yes Brunetta Jeans, PA-C  furosemide (LASIX) 40 MG tablet Take 1 tablet (40 mg total) by mouth daily. 03/22/15  Yes Kathryn Minium, MD  guaiFENesin (ROBITUSSIN) 100 MG/5ML SOLN Take 5-10  mLs by mouth every 4 (four) hours as needed for cough or to loosen phlegm.   Yes [provider]  hydrALAZINE (APRESOLINE) 100 MG tablet Take 1 tablet (100 mg total) by mouth 3 (three) times daily. 05/06/17  Yes Kathryn Minium, MD  levothyroxine (SYNTHROID, LEVOTHROID) 50 MCG tablet TAKE 1 TABLET BY MOUTH ONCE DAILY 06/28/17  Yes Kathryn Minium, MD  Multiple  Vitamins-Minerals (MULTIVITAMIN ADULTS 50+ PO) Take 1 tablet by mouth daily.    Yes [provider]  simvastatin (ZOCOR) 40 MG tablet TAKE ONE TABLET BY MOUTH AT BEDTIME Patient taking differently: TAKE HALF TABLET BY MOUTH AT BEDTIME 02/12/17  Yes Kathryn Minium, MD  warfarin (COUMADIN) 3 MG tablet TAKE TWO TABLETS BY MOUTH ONCE DAILY Patient taking differently: Take 1.5 tablets by mouth on Monday and Friday, take 1 tablet by mouth all other days 03/04/17  Yes Kathryn Minium, MD  colchicine 0.6 MG tablet 2 tabs x1 dose and then 1 tab 1 hr later.  Repeat in 3 days if needed. Patient not taking: Reported on 07/14/2017 06/28/15   Kathryn Minium, MD   No Known Allergies Review of Systems  Unable to perform ROS: Dementia   Physical Exam  Constitutional: She is easily aroused. She appears ill.  HENT:  Head: Normocephalic and atraumatic.  Pulmonary/Chest: No accessory muscle usage. No tachypnea. No respiratory distress.  Neurological: She is easily aroused.  Wakes to voice, drowsy, pleasantly confused.  Skin: Skin is warm and dry.  Psychiatric: Her speech is delayed. She is inattentive.  Nursing note and vitals reviewed.  Vital Signs: BP (!) 163/49 (BP Location: Right Arm)   Pulse (!) 52   Temp 99.2 F (37.3 C) (Oral)   Resp 15   Ht '4\' 11"'$  (1.499 m)   Wt 74.8 kg (165 lb)   SpO2 95%   BMI 33.33 kg/m  Pain Scale: 0-10   Pain Score: 0-No pain   SpO2: SpO2: 95 % O2 Device:SpO2: 95 % O2 Flow Rate: .O2 Flow Rate (L/min): 2 L/min  IO: Intake/output summary:   Intake/Output Summary (Last 24  hours) at 07/18/2017 1603 Last data filed at 07/18/2017 1453 Gross per 24 hour  Intake 690 ml  Output 1350 ml  Net -660 ml    LBM: Last BM Date: 07/12/17 Baseline Weight: Weight: 74.8 kg (165 lb) Most recent weight: Weight: 74.8 kg (165 lb)     Palliative Assessment/Data: PPS 40%   Flowsheet Rows     Most Recent Value  Intake Tab  Referral Department  Hospitalist  Unit at Time of Referral  Med/Surg Unit  Palliative Care Primary Diagnosis  -- [dementia, PNA, UTI, diastolic CHF]  Palliative Care Type  New Palliative care  Reason for referral  Clarify Goals of Care  Date first seen by Palliative Care  07/18/17  Clinical Assessment  Palliative Performance Scale Score  40%  Psychosocial & Spiritual Assessment  Palliative Care Outcomes  Patient/Family meeting held?  Yes  Who was at the meeting?  daughter  Palliative Care Outcomes  Clarified goals of care, Counseled regarding hospice, Provided psychosocial or spiritual support, ACP counseling assistance, Provided end of life care assistance, Linked to palliative care logitudinal support, Provided advance care planning      Time In: 1450 Time Out: 1600 Time Total: 33mn Greater than 50%  of this time was spent counseling and coordinating care related to the above assessment and plan.  Signed by:  MIhor Dow FNP-C Palliative Medicine Team  Phone: [phone]: [phone]  Please contact Palliative Medicine Team phone at [phone] questions and concerns.  For individual provider: See AShea Evans

## 2017-07-18 NOTE — Progress Notes (Signed)
OT Cancellation Note  Patient Details Name: [name] MRN: [id number] DOB: [birth year]   Cancelled Treatment:    Reason Eval/Treat Not Completed: Other (comment).  Pt was very sleepy when I checked on her.  Will return later   [facility] 07/18/2017, 11:46 AM Marica Otter, OTR/L [phone] 07/18/2017

## 2017-07-18 NOTE — Progress Notes (Signed)
OT Cancellation Note  Patient Details Name: [name] MRN: [id number] DOB: 28-Sep-1929   Cancelled Treatment:    Reason Eval/Treat Not Completed: Fatigue/lethargy limiting ability to participate   OT will check back tomorrow. Jessie Schrieber 07/18/2017, 1:44 PM  Marica Otter, OTR/L [phone] 07/18/2017

## 2017-07-18 NOTE — Progress Notes (Signed)
Chart reviewed and patient assessment completed. Met with daughter, [name], at bedside to discuss diagnoses, intervention, underlying co-morbidities (dementia), and prognosis. Daughter confirms the patient's wishes against heroic measures including resuscitation, life support, feeding tube. MOST form completed. At this point, [name] is most worried about disposition plan and she speaks of the challenges with caring for her mother at home. [name] is hopeful that a rehab facility will accept her mother. Interested in outpatient palliative services, understanding this can transition to hospice services if she continues to decline.   Full note to follow.   NO CHARGE  Ihor Dow, FNP-C Palliative Medicine Team  Phone: [phone] Fax: [phone]

## 2017-07-18 NOTE — Progress Notes (Signed)
Patient ID: [name], female   DOB: 08-23-1929, 84 y.o.   MRN: [id number]  PROGRESS NOTE    ECE [place]  XLK:[id number] DOB: 1929/04/16 DOA: 07/14/2017 PCP: Sheliah Hatch, MD   Brief Narrative:  84 year old female with history of atrial fibrillation on Coumadin, CKD 3, diabetes mellitus type 2, hypertension, GERD, hyperlipidemia, question of CHF presented on 07/14/2017 with for evaluation of fever along with dark stools.  Patient was admitted for multiple medical problems including committee acquired pneumonia, UTI, GI bleed with subtherapeutic INR along with possibly diastolic CHF exacerbation.  Assessment & Plan:   Active Problems:   Essential hypertension   GERD   Atrial fibrillation (HCC)   Anemia   Hyperlipidemia   Chronic diastolic CHF (congestive heart failure), NYHA class 1 (HCC)   Chronic diarrhea   Dementia   Community acquired pneumonia   GI bleed   Supratherapeutic INR   Acute UTI   AKI (acute kidney injury) (HCC)   Cough   Dehydration   Warfarin-induced coagulopathy (HCC)   Community-acquired pneumonia -Blood cultures negative so far.  Currently on Zithromax and Rocephin -Still on 2 L nasal cannula oxygen -Follow repeat chest x-ray for today -Incentive spirometry  UTI with Klebsiella oxytoca -Continue Rocephin  Acute on chronic normocytic anemia with acute blood loss anemia -Hemoglobin 7.9 today.  Transfuse if hemoglobin less than 7  Probable upper GI bleeding presenting with dark stools -Continue oral Protonix.  prior hospitalist discussed with patient's daughter regarding endoscopy or colonoscopy.  Patient's daughter did not wish to have any procedures for now -Palliative care consult for goals of care is pending  Supratherapeutic INR -INR is still elevated.  Hold Coumadin.  Repeat a.m. Labs. needed no evidence of overt bleeding  Probable acute diastolic CHF -Echo showed EF of 55 to 60% with grade 2 diastolic dysfunction -Strict input and  output.  Daily weights.  Continue Lasix 40 milligrams daily.  Follow-up chest x-ray for today.  If there is evidence of worsening volume overload, might need to change Lasix to IV.  Chronic kidney disease stage IV -Creatinine stable.  Monitor  Essential hypertension -Monitor blood pressure.  Continue amlodipine and hydralazine with holding parameters  Hyperlipidemia -Continue statin  Hypothyroidism -Continue Synthroid  Diabetes mellitus type 2 -Diet controlled.  Continue Accu-Cheks  dementia -Stable.  Fall precautions  Chronic atrial fibrillation -Currently rate controlled.  Coumadin on hold  Hypokalemia -Replace.  Repeat a.m. labs     DVT prophylaxis: Coumadin on hold Code Status: DNR Family Communication: None at bedside Disposition Plan: Nursing home in 1 to 3 days  Consultants: None Procedures:  Echo - Left ventricle: The cavity size was normal. Systolic function was   normal. The estimated ejection fraction was in the range of 55%   to 60%. Wall motion was normal; there were no regional wall   motion abnormalities. Features are consistent with a pseudonormal   left ventricular filling pattern, with concomitant abnormal   relaxation and increased filling pressure (grade 2 diastolic   dysfunction). - Mitral valve: There was mild regurgitation. - Left atrium: The atrium was severely dilated. - Right atrium: The atrium was severely dilated. - Pulmonary arteries: Systolic pressure was moderately to severely   increased. PA peak pressure: 66 mm Hg (S). - Pericardium, extracardiac: A trivial pericardial effusion was   identified posterior to the heart.  Antimicrobials:  Rocephin and Zithromax from 07/14/2017 onwards   Subjective: Patient seen and examined at bedside.  She is a poor  historian but is awake and denies any new complaints.  No overnight fever, nausea or vomiting.  Nursing staff has not reported any overt bleeding.  Objective: Vitals:   07/17/17  0549 07/17/17 0912 07/17/17 1406 07/18/17 0507  BP: (!) 152/39 (!) 141/47 (!) 148/40 (!) 136/51  Pulse: (!) 47 (!) 55 (!) 41 (!) 43  Resp: Temp: 98 F (36.7 C)  98.9 F (37.2 C) 98.7 F (37.1 C)  TempSrc: Oral  Oral Oral  SpO2: 95% 96% 97% 97%  Weight:      Height:        Intake/Output Summary (Last 24 hours) at 07/18/2017 0943 Last data filed at 07/18/2017 0506 Gross per 24 hour  Intake 570 ml  Output 1125 ml  Net -555 ml   Filed Weights   07/14/17 1420  Weight: 74.8 kg (165 lb)    Examination:  General exam: No acute distress.  Awake but a poor historian Respiratory system: Bilateral decreased breath sounds at bases with basilar crackles  cardiovascular system: Rate controlled, S1-S2 heard gastrointestinal system: Abdomen is nondistended, soft and nontender. Normal bowel sounds heard. Extremities: Trace edema, no cyanosis   Data Reviewed: I have personally reviewed following labs and imaging studies  CBC: Recent Labs  Lab 07/15/17 0632 07/15/17 1124 07/15/17 1438 07/16/17 0416 07/17/17 0414 07/18/17 0505  WBC 9.2 9.3  --  8.5 7.4 7.0  NEUTROABS  --   --   --   --   --  5.3  HGB 8.3* 8.4*  --  8.0* 8.4* 7.9*  HCT 25.8* 26.0* 25.8* 25.2* 25.7* 24.5*  MCV 91.5 92.2  --  92.6 90.8 91.4  PLT 190 190  --  196 199 180   Basic Metabolic Panel: Recent Labs  Lab 07/14/17 1442 07/15/17 0313 07/16/17 0416 07/17/17 0414 07/18/17 0505  NA 138 139 143 140 140  K 3.4* 3.4* 3.7 4.0 3.3*  CL 103 105 110 109 108  CO2 22 22 21* 21* 22  GLUCOSE 154* 107* 94 95 108*  BUN 86* 76* 67* 59* 50*  CREATININE 1.87* 1.77* 1.73* 1.71* 1.62*  CALCIUM 8.8* 8.1* 8.4* 8.0* 8.1*  MG  --   --   --   --  2.1   GFR: Estimated Creatinine Clearance: 22 mL/min (A) (by C-G formula based on SCr of 1.62 mg/dL (H)). Liver Function Tests: Recent Labs  Lab 07/14/17 1442 07/15/17 0313  AST 242* 211*  ALT 106* 100*  ALKPHOS 84 76  BILITOT 0.8 0.5  PROT 7.3 6.6  ALBUMIN  3.2* 3.0*   No results for input(s): LIPASE, AMYLASE in the last 168 hours. No results for input(s): AMMONIA in the last 168 hours. Coagulation Profile: Recent Labs  Lab 07/15/17 0313 07/15/17 4098 07/16/17 0416 07/17/17 0414 07/18/17 0505  INR 6.55* 6.01* 4.24* 5.20* 5.13*   Cardiac Enzymes: No results for input(s): CKTOTAL, CKMB, CKMBINDEX, TROPONINI in the last 168 hours. BNP (last 3 results) No results for input(s): PROBNP in the last 8760 hours. HbA1C: No results for input(s): HGBA1C in the last 72 hours. CBG: Recent Labs  Lab 07/17/17 1643 07/17/17 1943 07/17/17 2316 07/18/17 0349 07/18/17 0729  GLUCAP 110* 174* 135* 141* 109*   Lipid Profile: No results for input(s): CHOL, HDL, LDLCALC, TRIG, CHOLHDL, LDLDIRECT in the last 72 hours. Thyroid Function Tests: No results for input(s): TSH, T4TOTAL, FREET4, T3FREE, THYROIDAB in the last 72 hours. Anemia Panel: Recent Labs    07/15/17 1438  VITAMINB12 637  FERRITIN 120  TIBC 241*  IRON 27*   Sepsis Labs: Recent Labs  Lab 07/14/17 1456 07/14/17 1639  LATICACIDVEN 2.36* 1.00    Recent Results (from the past 240 hour(s))  Blood Culture (routine x 2)     Status: None (Preliminary result)   Collection Time: 07/14/17  3:23 PM  Result Value Ref Range Status   Specimen Description   Final    BLOOD LEFT FOREARM Performed at [facility], [address] [place], [place] [address]    Special Requests   Final    BOTTLES DRAWN AEROBIC AND ANAEROBIC Blood Culture results may not be optimal due to an excessive volume of blood received in culture bottles Performed at [facility], [address] [place], [place] [address]    Culture   Final    NO GROWTH 3 DAYS Performed at [facility], [address] [place], [place] [address]    Report Status PENDING  Incomplete  Blood Culture (routine x 2)     Status: None (Preliminary result)   Collection Time: 07/14/17  4:38 PM  Result  Value Ref Range Status   Specimen Description   Final    BLOOD RIGHT ANTECUBITAL Performed at [facility], [address], [place], [place] [address]    Special Requests   Final    BOTTLES DRAWN AEROBIC AND ANAEROBIC Blood Culture results may not be optimal due to an excessive volume of blood received in culture bottles Performed at [facility], [address], [place], [place] [address]    Culture   Final    NO GROWTH 3 DAYS Performed at [facility], [address], [place], [place] [address]    Report Status PENDING  Incomplete  Urine Culture     Status: Abnormal   Collection Time: 07/14/17  6:20 PM  Result Value Ref Range Status   Specimen Description   Final    URINE, RANDOM Performed at [facility], [address], [place], [place] [address]    Special Requests   Final    NONE Performed at [facility], [address] [place], [place] [address]    Culture >=100,000 COLONIES/mL KLEBSIELLA OXYTOCA (A)  Final   Report Status 07/17/2017 FINAL  Final   Organism ID, Bacteria KLEBSIELLA OXYTOCA (A)  Final      Susceptibility   Klebsiella oxytoca - MIC*    AMPICILLIN >=32 RESISTANT Resistant     CEFAZOLIN 8 SENSITIVE Sensitive     CEFTRIAXONE <=1 SENSITIVE Sensitive     CIPROFLOXACIN <=0.25 SENSITIVE Sensitive     GENTAMICIN <=1 SENSITIVE Sensitive     IMIPENEM <=0.25 SENSITIVE Sensitive     NITROFURANTOIN <=16 SENSITIVE Sensitive     TRIMETH/SULFA <=20 SENSITIVE Sensitive     AMPICILLIN/SULBACTAM 8 SENSITIVE Sensitive     PIP/TAZO <=4 SENSITIVE Sensitive     Extended ESBL NEGATIVE Sensitive     * >=100,000 COLONIES/mL KLEBSIELLA OXYTOCA         Radiology Studies: No results found.      Scheduled Meds: . allopurinol  100 mg Oral Daily  . amLODipine  10 mg Oral QHS  . atorvastatin  20 mg Oral QHS  . dextromethorphan-guaiFENesin  1 tablet Oral BID  . feeding supplement  1  Container Oral TID BM  . fluticasone  2 spray Each Nare Daily  . furosemide  40 mg Oral Daily  .  hydrALAZINE  100 mg Oral TID  . insulin aspart  0-15 Units Subcutaneous Q4H  . levothyroxine  50 mcg Oral Daily  . loratadine  10 mg Oral Daily  . multivitamin with minerals   Oral Daily  . pantoprazole  40 mg Oral BID AC  . potassium chloride  60 mEq Oral Once   Continuous Infusions: . azithromycin Stopped (07/17/17 2210)  . cefTRIAXone (ROCEPHIN)  IV Stopped (07/17/17 1741)     LOS: 4 days        Glade Lloyd, MD [facility] Pager [phone]  If 7PM-7AM, please contact night-coverage [email or web address] Password [facility] 07/18/2017, 9:43 AM

## 2017-07-19 DIAGNOSIS — Z515 Encounter for palliative care: Secondary | ICD-10-CM

## 2017-07-19 LAB — CBC WITH DIFFERENTIAL/PLATELET
BASOS PCT: 0 %
Basophils Absolute: 0 10*3/uL (ref 0.0–0.1)
EOS ABS: 0.2 10*3/uL (ref 0.0–0.7)
EOS PCT: 2 %
HCT: 24.4 % — ABNORMAL LOW (ref 36.0–46.0)
Hemoglobin: 8 g/dL — ABNORMAL LOW (ref 12.0–15.0)
LYMPHS ABS: 0.7 10*3/uL (ref 0.7–4.0)
Lymphocytes Relative: 10 %
MCH: 29.9 pg (ref 26.0–34.0)
MCHC: 32.8 g/dL (ref 30.0–36.0)
MCV: 91 fL (ref 78.0–100.0)
MONOS PCT: 8 %
Monocytes Absolute: 0.6 10*3/uL (ref 0.1–1.0)
NEUTROS PCT: 80 %
Neutro Abs: 6.2 10*3/uL (ref 1.7–7.7)
Platelets: 192 10*3/uL (ref 150–400)
RBC: 2.68 MIL/uL — AB (ref 3.87–5.11)
RDW: 17.1 % — ABNORMAL HIGH (ref 11.5–15.5)
WBC: 7.8 10*3/uL (ref 4.0–10.5)

## 2017-07-19 LAB — BASIC METABOLIC PANEL
Anion gap: 10 (ref 5–15)
BUN: 43 mg/dL — AB (ref 6–20)
CO2: 23 mmol/L (ref 22–32)
CREATININE: 1.57 mg/dL — AB (ref 0.44–1.00)
Calcium: 7.9 mg/dL — ABNORMAL LOW (ref 8.9–10.3)
Chloride: 109 mmol/L (ref 101–111)
GFR calc non Af Amer: 29 mL/min — ABNORMAL LOW (ref >60)
GFR, EST AFRICAN AMERICAN: 33 mL/min — AB (ref >60)
Glucose, Bld: 101 mg/dL — ABNORMAL HIGH (ref 65–99)
Potassium: 3.6 mmol/L (ref 3.5–5.1)
SODIUM: 142 mmol/L (ref 135–145)

## 2017-07-19 LAB — CULTURE, BLOOD (ROUTINE X 2)
CULTURE: NO GROWTH
CULTURE: NO GROWTH

## 2017-07-19 LAB — MAGNESIUM: MAGNESIUM: 1.9 mg/dL (ref 1.7–2.4)

## 2017-07-19 LAB — GLUCOSE, CAPILLARY
GLUCOSE-CAPILLARY: 189 mg/dL — AB (ref 65–99)
GLUCOSE-CAPILLARY: 202 mg/dL — AB (ref 65–99)
GLUCOSE-CAPILLARY: 91 mg/dL (ref 65–99)
Glucose-Capillary: 100 mg/dL — ABNORMAL HIGH (ref 65–99)
Glucose-Capillary: 117 mg/dL — ABNORMAL HIGH (ref 65–99)
Glucose-Capillary: 94 mg/dL (ref 65–99)
Glucose-Capillary: 98 mg/dL (ref 65–99)

## 2017-07-19 LAB — PROTIME-INR
INR: 4.46
PROTHROMBIN TIME: 42.1 s — AB (ref 11.4–15.2)

## 2017-07-19 NOTE — Progress Notes (Signed)
CRITICAL VALUE ALERT  Critical Value:INR 4.46  Date & Time Notied:  07/19/2017  ( 05:22 am)   Provider Notified: X,Blount  Orders Received/Actions taken:

## 2017-07-19 NOTE — Progress Notes (Signed)
CSW updated patient daughter about insurance authorization,- still pending.   Daughter reports she was with the patient today when she worked with OT and observed the patient was out of breath. She is hopeful the patient will improve and progress at SNF. Patient daughter is not sure what she will do if insurance does not pay for patient SNF stay for rehab. She is unsure if she will be able to pay privately for custodial care.   She reports feeling "anxious" about everything. She hopes to talk with physician more about the patient trajectory.   CSW will continue to follow for SNF rehab needs. Vivi Barrack, Theresia Majors, MSW Clinical Social Worker  [phone] 07/19/2017  3:00 PM

## 2017-07-19 NOTE — NC FL2 (Addendum)
[place] MEDICAID FL2 LEVEL OF CARE SCREENING TOOL     IDENTIFICATION  Patient Name: [name] Birthdate: [birth year] Sex: female Admission Date (Current Location): 07/14/2017  [facility] and [place] Number:  Producer, television/film/video and Address:  [facility],  [address], [place] [address]      Provider Number: [id number]  Attending Physician Name and Address:  Glade Lloyd, MD  Relative Name and Phone Number:       Current Level of Care: Hospital Recommended Level of Care: Skilled Nursing Facility Prior Approval Number:    Date Approved/Denied:   PASRR Number:   [id number]   Discharge Plan: SNF    Current Diagnoses: Patient Active Problem List   Diagnosis Date Noted  . Palliative care by specialist   . GI bleed 07/15/2017  . Supratherapeutic INR 07/15/2017  . Acute UTI 07/15/2017  . AKI (acute kidney injury) (HCC)   . Cough   . Dehydration   . Warfarin-induced coagulopathy (HCC)   . Community acquired pneumonia 07/14/2017  . Chronic diarrhea 05/06/2017  . Dementia 05/06/2017  . Long term (current) use of anticoagulants 12/07/2016  . Goals of care, counseling/discussion 06/01/2015  . Gout of right wrist 04/12/2015  . Chronic diastolic CHF (congestive heart failure), NYHA class 1 (HCC) 03/12/2015  . Tremor of both hands 01/14/2014  . BRBPR (bright red blood per rectum) 01/14/2014  . Asthma with acute exacerbation 12/10/2012  . Osteopenia 08/16/2011  . General medical examination 08/16/2011  . Bronchitis 03/08/2011  . Hyperlipidemia 02/21/2011  . Anemia 12/29/2010  . Abnormal LFTs 12/29/2010  . Current use of long term anticoagulation 12/29/2010  . Bradycardia 09/09/2010  . Atrial fibrillation (HCC) 04/26/2010  . History of diet-controlled diabetes 11/09/2009  . CAROTID BRUIT, LEFT 11/09/2009  . INTRINSIC ASTHMA, UNSPECIFIED 09/27/2009  . DYSPNEA ON EXERTION 08/19/2009  . KNEE PAIN 07/20/2009  . EDEMA 07/20/2009  . OBESITY 12/07/2008   . Essential hypertension 03/24/2008  . GERD 03/24/2008    Orientation RESPIRATION BLADDER Height & Weight     Self, Place  Normal Continent Weight: 165 lb (74.8 kg) Height:   (149.9 cm)  BEHAVIORAL SYMPTOMS/MOOD NEUROLOGICAL BOWEL NUTRITION STATUS      Continent Diet(See discharge Summary )  AMBULATORY STATUS COMMUNICATION OF NEEDS Skin   Extensive Assist Verbally Normal                       Personal Care Assistance Level of Assistance  Bathing, Feeding, Dressing Bathing Assistance: Limited assistance Feeding assistance: Independent Dressing Assistance: Limited assistance     Functional Limitations Info  Sight, Hearing, Speech Sight Info: Adequate Hearing Info: Impaired Speech Info: Adequate    SPECIAL CARE FACTORS FREQUENCY  PT (By licensed PT), OT (By licensed OT)     PT Frequency: 5x/week OT Frequency: 5x/week            Contractures Contractures Info: Not present    Additional Factors Info  Code Status, Allergies Code Status Info: DNR Allergies Info: Allergies: No Known Allergies           Current Medications (07/19/2017):  This is the current hospital active medication list Current Facility-Administered Medications  Medication Dose Route Frequency Provider Last Rate Last Dose  . acetaminophen (TYLENOL) tablet 650 mg  650 mg Oral Q6H PRN Hugelmeyer, Alexis, DO       Or  . acetaminophen (TYLENOL) suppository 650 mg  650 mg Rectal Q6H PRN Hugelmeyer, Alexis, DO      .  allopurinol (ZYLOPRIM) tablet 100 mg  100 mg Oral Daily Hugelmeyer, Alexis, DO   100 mg at 07/19/17 0941  . amLODipine (NORVASC) tablet 10 mg  10 mg Oral QHS Mikhail, [place], DO   10 mg at 07/16/17 2158  . atorvastatin (LIPITOR) tablet 20 mg  20 mg Oral QHS Hugelmeyer, Alexis, DO   20 mg at 07/18/17 2158  . dextromethorphan-guaiFENesin (MUCINEX DM) 30-600 MG per 12 hr tablet 1 tablet  1 tablet Oral BID Hugelmeyer, Alexis, DO   1 tablet at 07/19/17 0941  . feeding supplement  (BOOST / RESOURCE BREEZE) liquid 1 Container  1 Container Oral TID BM Edsel Petrin, DO   1 Container at 07/19/17 [phone]  . fluticasone (FLONASE) 50 MCG/ACT nasal spray 2 spray  2 spray Each Nare Daily Hugelmeyer, Alexis, DO   2 spray at 07/18/17 1045  . furosemide (LASIX) injection 40 mg  40 mg Intravenous BID Glade Lloyd, MD   40 mg at 07/19/17 0734  . guaiFENesin (ROBITUSSIN) 100 MG/5ML solution 100-200 mg  5-10 mL Oral Q4H PRN Hugelmeyer, Alexis, DO      . hydrALAZINE (APRESOLINE) tablet 100 mg  100 mg Oral TID Edsel Petrin, DO   100 mg at 07/16/17 2158  . insulin aspart (novoLOG) injection 0-15 Units  0-15 Units Subcutaneous Q4H Hugelmeyer, Alexis, DO   3 Units at 07/18/17 1955  . ipratropium-albuterol (DUONEB) 0.5-2.5 (3) MG/3ML nebulizer solution 3 mL  3 mL Nebulization Q6H PRN Hugelmeyer, Alexis, DO   3 mL at 07/16/17 1202  . levothyroxine (SYNTHROID, LEVOTHROID) tablet 50 mcg  50 mcg Oral Daily Hugelmeyer, Alexis, DO   50 mcg at 07/19/17 0735  . loratadine (CLARITIN) tablet 10 mg  10 mg Oral Daily Hugelmeyer, Alexis, DO   10 mg at 07/19/17 0941  . multivitamin with minerals tablet   Oral Daily Hugelmeyer, Alexis, DO   1 tablet at 07/19/17 0941  . ondansetron (ZOFRAN) tablet 4 mg  4 mg Oral Q6H PRN Hugelmeyer, Alexis, DO       Or  . ondansetron (ZOFRAN) injection 4 mg  4 mg Intravenous Q6H PRN Hugelmeyer, Alexis, DO      . oxyCODONE (Oxy IR/ROXICODONE) immediate release tablet 5 mg  5 mg Oral Q4H PRN Hugelmeyer, Alexis, DO      . pantoprazole (PROTONIX) EC tablet 40 mg  40 mg Oral BID AC Alekh, Kshitiz, MD   40 mg at 07/19/17 9604     Discharge Medications: Please see discharge summary for a list of discharge medications.  Relevant Imaging Results:  Relevant Lab Results:   Additional Information SSN: [id number]  Clearance Coots, LCSW

## 2017-07-19 NOTE — Progress Notes (Signed)
Physical Therapy Treatment Patient Details Name: [name] MRN: [id number] DOB: 07/25/1929 Today's Date: 07/19/2017    History of Present Illness 84 y.o. female with a known history of afib on Coumadin, CHD, CKD3, DM2, HTN, GERD, HLD and admitted with CAP, UTI, GI bleed with supratherapeutic INR    PT Comments    Pt in bed on 2 lts sats 97%.  Assisted to EOB required increased assist. Great difficulty self scooting to EOB and posterior LOB.  Required extended sitting rest break before attempting sit to stand.  Pt required + 2 assist due to weakness and noted 3/4 dyspnea.  Attempted amb however unable to support self standing long enough to take any functional steps.  Assisted to straight back chair that was close by.  Required another extended rest break before assisting back to bed + 2 Total Assist.  Positioned to comfort.  Pt will need ST Rehab at SNF.  Follow Up Recommendations  SNF     Equipment Recommendations  None recommended by PT    Recommendations for Other Services       Precautions / Restrictions Precautions Precautions: Fall Precaution Comments: monitor sats Restrictions Weight Bearing Restrictions: No    Mobility  Bed Mobility Overal bed mobility: Needs Assistance Bed Mobility: Supine to Sit;Sit to Supine     Supine to sit: Max assist;Total assist Sit to supine: Total assist;+2 for physical assistance;+2 for safety/equipment   General bed mobility comments: required increased assist  Transfers Overall transfer level: Needs assistance Equipment used: Rolling walker (2 wheeled) Transfers: Sit to/from [employer] Transfers Sit to Stand: Max assist;+2 physical assistance;+2 safety/equipment Stand pivot transfers: +2 physical assistance;+2 safety/equipment;Max assist;Total assist       General transfer comment: required increased assist with limited upright stance tolerance.    Ambulation/Gait             General Gait Details:    attempted  however due to weakness and fatigue       sit to stand EOB to straight back chair then back to bed    Stairs             Wheelchair Mobility    Modified Rankin (Stroke Patients Only)       Balance                                            Cognition Arousal/Alertness: Awake/alert                                     General Comments: appears tiered      Exercises      General Comments        Pertinent Vitals/Pain Pain Assessment: No/denies pain    Home Living                      Prior Function            PT Goals (current goals can now be found in the care plan section) Progress towards PT goals: Progressing toward goals    Frequency    Min 3X/week      PT Plan Current plan remains appropriate    Co-evaluation              AM-PAC PT "6 Clicks" Daily Activity  Outcome Measure  Difficulty turning over in bed (including adjusting bedclothes, sheets and blankets)?: Unable Difficulty moving from lying on back to sitting on the side of the bed? : Unable Difficulty sitting down on and standing up from a chair with arms (e.g., wheelchair, bedside commode, etc,.)?: Unable Help needed moving to and from a bed to chair (including a wheelchair)?: Total Help needed walking in hospital room?: Total Help needed climbing 3-5 steps with a railing? : Total 6 Click Score: 6    End of Session Equipment Utilized During Treatment: Gait belt;Oxygen Activity Tolerance: Patient limited by fatigue Patient left: in bed;with bed alarm set;with call bell/phone within reach;with family/visitor present Nurse Communication: Mobility status PT Visit Diagnosis: Other abnormalities of gait and mobility (R26.89)     Time: 1535-1600 PT Time Calculation (min) (ACUTE ONLY): 25 min  Charges:  $Gait Training: 8-22 mins $Therapeutic Activity: 8-22 mins                    G Codes:       Felecia Shelling  PTA [facility]  Acute   Rehab Pager      [phone]

## 2017-07-19 NOTE — Evaluation (Signed)
Occupational Therapy Evaluation Patient Details Name: [name] MRN: [id number] DOB: [birth year] Today's Date: 07/19/2017    History of Present Illness 84 y.o. female with a known history of afib on Coumadin, CHD, CKD3, DM2, HTN, GERD, HLD and admitted with CAP, UTI, GI bleed with supratherapeutic INR   Clinical Impression   This 84 y/o female presents with the above. Pt's daughter present and reports recent decline in pt's ability to complete functional mobility and ADLs leading up to this admission, reports pt was using w/c some and receiving assist from daughter for ADL completion. Pt presenting with significant fatigue and weakness this session, requiring modA for bed mobility and min-modA to maintain static sitting balance during simple grooming ADLs. Currently requires totalA for toileting and LB ADLs at bed level. Pt will benefit from continued acute OT services and recommend SNF level therapy services after discharge to maximize her overall strength, safety and independence with ADLs and mobility as well as to decrease level of caregiver assist required after return home.     Follow Up Recommendations  SNF;Supervision/Assistance - 24 hour    Equipment Recommendations  Other (comment)(TBD in next venue )           Precautions / Restrictions Precautions Precautions: Fall Precaution Comments: monitor sats Restrictions Weight Bearing Restrictions: No      Mobility Bed Mobility Overal bed mobility: Needs Assistance Bed Mobility: Rolling;Supine to Sit;Sit to Supine Rolling: Mod assist   Supine to sit: Mod assist Sit to supine: Mod assist   General bed mobility comments: assist for trunk upright and LEs onto bed, increased time and effort, requires rest breaks during supine>sit                        Balance Overall balance assessment: History of Falls;Needs assistance Sitting-balance support: Single extremity supported Sitting balance-Leahy Scale:  Poor Sitting balance - Comments: pt requiring up to modA to maintain static sitting balance                                    ADL either performed or assessed with clinical judgement   ADL Overall ADL's : Needs assistance/impaired Eating/Feeding: Set up;Sitting;Bed level   Grooming: Wash/dry face;Set up;Moderate assistance;Sitting Grooming Details (indicate cue type and reason): pt requiring up to [facility] for sitting balance during task completion  Upper Body Bathing: Minimal assistance;Bed level       Upper Body Dressing : Moderate assistance;Bed level Upper Body Dressing Details (indicate cue type and reason): doffing/donning new gown          Toileting- Clothing Manipulation and Hygiene: Total assistance;+2 for physical assistance;Bed level         General ADL Comments: pt completing bed mobility and simple grooming ADLs seated EOB; pt with significant weakness and fatigue, requires increased time and rest breaks to achieve sitting EOB and only able to tolerate sitting EOB for a short time, requiring up to modA to maintain sitting balance                          Pertinent Vitals/Pain Pain Assessment: No/denies pain     Hand Dominance Right   Extremity/Trunk Assessment Upper Extremity Assessment Upper Extremity Assessment: Generalized weakness   Lower Extremity Assessment Lower Extremity Assessment: Defer to PT evaluation       Communication Communication Communication: No difficulties  Cognition Arousal/Alertness: Awake/alert(sleepy) Behavior During Therapy: WFL for tasks assessed/performed Overall Cognitive Status: History of cognitive impairments - at baseline                                 General Comments: per chart review pt with baseline dementia; noted inconsistent responses when providing answers to PLOF, pt's daughter present and providing some correct answers    General Comments                  Home  Living Family/patient expects to be discharged to:: Private residence Living Arrangements: Children Available Help at Discharge: Family Type of Home: House Home Access: Level entry     Home Layout: One level               Home Equipment: Environmental consultant - 2 wheels;Cane - single point;Wheelchair - manual          Prior Functioning/Environment Level of Independence: Needs assistance        Comments: typically uses RW, daughter reports she has been having to assist pt with dressing and bathing, also requiring more physical assist prior to admission and reports pt was not ambulating as much, mostly using w/c         OT Problem List: Decreased strength;Impaired balance (sitting and/or standing);Decreased activity tolerance      OT Treatment/Interventions: Self-care/ADL training;DME and/or AE instruction;Therapeutic activities;Balance training;Patient/family education    OT Goals(Current goals can be found in the care plan section) Acute Rehab OT Goals Patient Stated Goal: none stated  Time For Goal Achievement: 08/02/17 Potential to Achieve Goals: Fair  OT Frequency: Min 2X/week                             AM-PAC PT "6 Clicks" Daily Activity     Outcome Measure Help from another person eating meals?: A Little Help from another person taking care of personal grooming?: A Little Help from another person toileting, which includes using toliet, bedpan, or urinal?: Total Help from another person bathing (including washing, rinsing, drying)?: A Lot Help from another person to put on and taking off regular upper body clothing?: A Lot Help from another person to put on and taking off regular lower body clothing?: Total 6 Click Score: 12   End of Session Equipment Utilized During Treatment: Oxygen Nurse Communication: Mobility status  Activity Tolerance: Patient limited by fatigue Patient left: in bed;with call bell/phone within reach;with bed alarm set;with family/visitor  present  OT Visit Diagnosis: Muscle weakness (generalized) (M62.81)                Time: 1610-9604 OT Time Calculation (min): 27 min Charges:  OT General Charges $OT Visit: 1 Visit OT Evaluation $OT Eval Moderate Complexity: 1 Mod OT Treatments $Self Care/Home Management : 8-22 mins G-Codes:     Marcy Siren, OT Pager [phone] 07/19/2017   Orlando Penner 07/19/2017, 11:57 AM

## 2017-07-19 NOTE — Progress Notes (Signed)
Patient ID: [name], female   DOB: [birth year], 84 y.o.   MRN: [id number]  PROGRESS NOTE    [name]  WUJ:[id number] DOB: 20-Sep-1929 DOA: 07/14/2017 PCP: Sheliah Hatch, MD   Brief Narrative:  84 year old female with history of atrial fibrillation on Coumadin, CKD 3, diabetes mellitus type 2, hypertension, GERD, hyperlipidemia, question of CHF presented on 07/14/2017 with for evaluation of fever along with dark stools.  Patient was admitted for multiple medical problems including committee acquired pneumonia, UTI, GI bleed with subtherapeutic INR along with possibly diastolic CHF exacerbation.  Assessment & Plan:   Active Problems:   Essential hypertension   GERD   Atrial fibrillation (HCC)   Anemia   Hyperlipidemia   Chronic diastolic CHF (congestive heart failure), NYHA class 1 (HCC)   Chronic diarrhea   Dementia   Community acquired pneumonia   GI bleed   Supratherapeutic INR   Acute UTI   AKI (acute kidney injury) (HCC)   Cough   Dehydration   Warfarin-induced coagulopathy (HCC)   Community-acquired pneumonia -Blood cultures negative so far.  Treated with 5-day course of Rocephin and Zithromax which were discontinued on 07/18/2017 -Still on 2 L nasal cannula oxygen -Incentive spirometry  UTI with Klebsiella oxytoca -Antibiotic plan as above  Acute on chronic normocytic anemia with acute blood loss anemia -Hemoglobin 8 today.  Transfuse if hemoglobin less than 7  Probable upper GI bleeding presenting with dark stools -Continue oral Protonix.  prior hospitalist discussed with patient's daughter regarding endoscopy or colonoscopy.  Patient's daughter did not wish to have any procedures for now.  Family should consider discontinuing Coumadin on discharge as well -Palliative care following.  Full recommendations to follow  Supratherapeutic INR -INR is still elevated.  Hold Coumadin.  Repeat a.m. Labs. needed no evidence of overt bleeding  Probable acute on  chronic diastolic CHF -Echo showed EF of 55 to 60% with grade 2 diastolic dysfunction -Strict input and output.  Daily weights.  Chest x-ray from yesterday showed some fluid overload.  Continue Lasix 40 mill grams IV every 12 hours.  Patient is having good urine output  Chronic kidney disease stage IV -Creatinine stable.  Monitor  Essential hypertension -Monitor blood pressure.  Continue amlodipine and hydralazine with holding parameters  Hyperlipidemia -Continue statin  Hypothyroidism -Continue Synthroid  Diabetes mellitus type 2 -Diet controlled.  Continue Accu-Cheks  dementia -Stable.  Fall precautions  Chronic atrial fibrillation -Currently bradycardic.  Coumadin on hold  Hypokalemia -Improved.  Repeat a.m. labs     DVT prophylaxis: Coumadin on hold Code Status: DNR Family Communication: None at bedside Disposition Plan: Nursing home in 1 to 2 days  Consultants: None Procedures:  Echo - Left ventricle: The cavity size was normal. Systolic function was   normal. The estimated ejection fraction was in the range of 55%   to 60%. Wall motion was normal; there were no regional wall   motion abnormalities. Features are consistent with a pseudonormal   left ventricular filling pattern, with concomitant abnormal   relaxation and increased filling pressure (grade 2 diastolic   dysfunction). - Mitral valve: There was mild regurgitation. - Left atrium: The atrium was severely dilated. - Right atrium: The atrium was severely dilated. - Pulmonary arteries: Systolic pressure was moderately to severely   increased. PA peak pressure: 66 mm Hg (S). - Pericardium, extracardiac: A trivial pericardial effusion was   identified posterior to the heart.  Antimicrobials:  Rocephin and Zithromax from 07/14/2017 onwards  Subjective: Patient seen and examined at bedside.  She is a poor historian, awake but states that she could not sleep well last night.  Bleeding reported by  nursing staff.  No overnight fever, nausea or vomiting reported.  Objective: Vitals:   07/18/17 1038 07/18/17 1434 07/18/17 2115 07/19/17 0616  BP: (!) 142/47 (!) 163/49 (!) 158/41 (!) 149/44  Pulse: (!) 48 (!) 52 (!) 49 (!) 45  Resp: Temp:  99.2 F (37.3 C) 99.8 F (37.7 C) 98.4 F (36.9 C)  TempSrc:  Oral Oral Oral  SpO2: 90% 95% 95% 97%  Weight:      Height:        Intake/Output Summary (Last 24 hours) at 07/19/2017 0856 Last data filed at 07/19/2017 0600 Gross per 24 hour  Intake 760 ml  Output 2050 ml  Net -1290 ml   Filed Weights   07/14/17 1420  Weight: 74.8 kg (165 lb)    Examination:  General exam: Elderly female lying in bed.  Awake but a poor historian.  No acute distress Respiratory system: Bilateral decreased breath sounds at bases with basilar crackles cardiovascular system: S1-S2 positive, rate controlled  gastrointestinal system: Abdomen is nondistended, soft and nontender. Normal bowel sounds heard. Extremities: Trace edema, no cyanosis   Data Reviewed: I have personally reviewed following labs and imaging studies  CBC: Recent Labs  Lab 07/15/17 1124 07/15/17 1438 07/16/17 0416 07/17/17 0414 07/18/17 0505 07/19/17 0416  WBC 9.3  --  8.5 7.4 7.0 7.8  NEUTROABS  --   --   --   --  5.3 6.2  HGB 8.4*  --  8.0* 8.4* 7.9* 8.0*  HCT 26.0* 25.8* 25.2* 25.7* 24.5* 24.4*  MCV 92.2  --  92.6 90.8 91.4 91.0  PLT 190  --  196 199 180 192   Basic Metabolic Panel: Recent Labs  Lab 07/15/17 0313 07/16/17 0416 07/17/17 0414 07/18/17 0505 07/19/17 0416  NA 139 143 140 140 142  K 3.4* 3.7 4.0 3.3* 3.6  CL 105 110 109 108 109  CO2 22 21* 21* 22 23  GLUCOSE 107* 94 95 108* 101*  BUN 76* 67* 59* 50* 43*  CREATININE 1.77* 1.73* 1.71* 1.62* 1.57*  CALCIUM 8.1* 8.4* 8.0* 8.1* 7.9*  MG  --   --   --  2.1 1.9   GFR: Estimated Creatinine Clearance: 22.7 mL/min (A) (by C-G formula based on SCr of 1.57 mg/dL (H)). Liver Function  Tests: Recent Labs  Lab 07/14/17 1442 07/15/17 0313  AST 242* 211*  ALT 106* 100*  ALKPHOS 84 76  BILITOT 0.8 0.5  PROT 7.3 6.6  ALBUMIN 3.2* 3.0*   No results for input(s): LIPASE, AMYLASE in the last 168 hours. No results for input(s): AMMONIA in the last 168 hours. Coagulation Profile: Recent Labs  Lab 07/15/17 0632 07/16/17 0416 07/17/17 0414 07/18/17 0505 07/19/17 0416  INR 6.01* 4.24* 5.20* 5.13* 4.46*   Cardiac Enzymes: No results for input(s): CKTOTAL, CKMB, CKMBINDEX, TROPONINI in the last 168 hours. BNP (last 3 results) No results for input(s): PROBNP in the last 8760 hours. HbA1C: No results for input(s): HGBA1C in the last 72 hours. CBG: Recent Labs  Lab 07/18/17 1615 07/18/17 1952 07/19/17 0002 07/19/17 0401 07/19/17 0725  GLUCAP 151* 160* 91 100* 98   Lipid Profile: No results for input(s): CHOL, HDL, LDLCALC, TRIG, CHOLHDL, LDLDIRECT in the last 72 hours. Thyroid Function Tests: No results for input(s): TSH, T4TOTAL, FREET4, T3FREE, THYROIDAB  in the last 72 hours. Anemia Panel: No results for input(s): VITAMINB12, FOLATE, FERRITIN, TIBC, IRON, RETICCTPCT in the last 72 hours. Sepsis Labs: Recent Labs  Lab 07/14/17 1456 07/14/17 1639  LATICACIDVEN 2.36* 1.00    Recent Results (from the past 240 hour(s))  Blood Culture (routine x 2)     Status: None (Preliminary result)   Collection Time: 07/14/17  3:23 PM  Result Value Ref Range Status   Specimen Description   Final    BLOOD LEFT FOREARM Performed at [facility], [address] [place], [place] [address]    Special Requests   Final    BOTTLES DRAWN AEROBIC AND ANAEROBIC Blood Culture results may not be optimal due to an excessive volume of blood received in culture bottles Performed at [facility], [address], [place], [place] [address]    Culture   Final    NO GROWTH 4 DAYS Performed at [facility], [address] [place], [place] [address]     Report Status PENDING  Incomplete  Blood Culture (routine x 2)     Status: None (Preliminary result)   Collection Time: 07/14/17  4:38 PM  Result Value Ref Range Status   Specimen Description   Final    BLOOD RIGHT ANTECUBITAL Performed at [facility], [address], [place], [place] [address]    Special Requests   Final    BOTTLES DRAWN AEROBIC AND ANAEROBIC Blood Culture results may not be optimal due to an excessive volume of blood received in culture bottles Performed at [facility], [address], [place], [place] [address]    Culture   Final    NO GROWTH 4 DAYS Performed at [facility], [address], [place], [place] [address]    Report Status PENDING  Incomplete  Urine Culture     Status: Abnormal   Collection Time: 07/14/17  6:20 PM  Result Value Ref Range Status   Specimen Description   Final    URINE, RANDOM Performed at [facility], [address], [place], [place] [address]    Special Requests   Final    NONE Performed at [facility], [address], [place], [place] [address]    Culture >=100,000 COLONIES/mL KLEBSIELLA OXYTOCA (A)  Final   Report Status 07/17/2017 FINAL  Final   Organism ID, Bacteria KLEBSIELLA OXYTOCA (A)  Final      Susceptibility   Klebsiella oxytoca - MIC*    AMPICILLIN >=32 RESISTANT Resistant     CEFAZOLIN 8 SENSITIVE Sensitive     CEFTRIAXONE <=1 SENSITIVE Sensitive     CIPROFLOXACIN <=0.25 SENSITIVE Sensitive     GENTAMICIN <=1 SENSITIVE Sensitive     IMIPENEM <=0.25 SENSITIVE Sensitive     NITROFURANTOIN <=16 SENSITIVE Sensitive     TRIMETH/SULFA <=20 SENSITIVE Sensitive     AMPICILLIN/SULBACTAM 8 SENSITIVE Sensitive     PIP/TAZO <=4 SENSITIVE Sensitive     Extended ESBL NEGATIVE Sensitive     * >=100,000 COLONIES/mL KLEBSIELLA OXYTOCA         Radiology Studies: Dg Chest 2 View  Result Date: 07/18/2017 CLINICAL DATA:  84 year old with  dyspnea. EXAM: CHEST - 2 VIEW COMPARISON:  07/14/2017 and 08/19/2009 FINDINGS: Basilar densities are compatible with small bilateral pleural effusions. The effusions appear larger compared to the recent comparison examination. In addition, there is fluid or thickening in the lung fissures. Prominent interstitial lung densities, right  side on greater than left. Heart size remains mildly enlarged. Atherosclerotic calcifications at the aortic arch. IMPRESSION: Enlarging small bilateral pleural effusions. Cardiomegaly with asymmetric vascular congestion or mild pulmonary edema. Difficult to exclude atypical infection in the right lung. Electronically Signed   By: Richarda Overlie M.D.   On: 07/18/2017 10:25        Scheduled Meds: . allopurinol  100 mg Oral Daily  . amLODipine  10 mg Oral QHS  . atorvastatin  20 mg Oral QHS  . dextromethorphan-guaiFENesin  1 tablet Oral BID  . feeding supplement  1 Container Oral TID BM  . fluticasone  2 spray Each Nare Daily  . furosemide  40 mg Intravenous BID  . hydrALAZINE  100 mg Oral TID  . insulin aspart  0-15 Units Subcutaneous Q4H  . levothyroxine  50 mcg Oral Daily  . loratadine  10 mg Oral Daily  . multivitamin with minerals   Oral Daily  . pantoprazole  40 mg Oral BID AC   Continuous Infusions:    LOS: 5 days        Glade Lloyd, MD [facility] Pager [phone]  If 7PM-7AM, please contact night-coverage [email or web address] Password [facility] 07/19/2017, 8:56 AM

## 2017-07-19 NOTE — Clinical Social Work Note (Signed)
Clinical Social Work Assessment  Patient Details  Name: [name] MRN: [id number] Date of Birth: 1929-03-26  Date of referral:  07/19/17               Reason for consult:  Facility Placement, Discharge Planning                Permission sought to share information with:  Family Supports Permission granted to share information::  Yes, Verbal Permission Granted  Name::        Agency::  SNF  Relationship::  Daughter   Contact Information:     Housing/Transportation Living arrangements for the past 2 months:  Single Family Home Source of Information:  Adult Children Patient Interpreter Needed:  None Criminal Activity/Legal Involvement Pertinent to Current Situation/Hospitalization:  No - Comment as needed Significant Relationships:  Adult Children Lives with:  Adult Children Do you feel safe going back to the place where you live?  Yes Need for family participation in patient care:  Yes (Comment)  Care giving concerns:   -Patient appetite is poor, deconditioned, weak and requiring assistance.  Patient daughter met with palliative care to discuss the patient goals of care. The patient daughter is agreeable to SNF  With palliative following if patient does not continue to improve. Patient daughter is hopeful the patient will progress well with PT.   Social Worker assessment / plan:   Patient lives in the home with her daughter. She needs assistance with all of ADL's. Patient daughter understands the patient is reaching a poitn where she will need a higher level of care.  CSW explain the SNF process and offered bed offers. Patient daughter reports she used the SNF list SW had given her to do research on the facility services and quality scores. During the visit, patient pastor arrived and was apart of SNF options discussion. All agreed on [employer]. CSW explain insurance authorization process. Patient daughter reports understanding.   FL2 completed.   Plan: SNF placement  with palliative following.   Barriers to discharge: Insurance authorization,  Employment status:  Retired Forensic scientist:  Managed Care PT Recommendations:  [place] / Referral to community resources:  [place]  Patient/Family's Response to care:  Agreeable and Responding well to care.   Patient/Family's Understanding of and Emotional Response to Diagnosis, Current Treatment, and Prognosis: Patient daughter is anxious about patient transition to SNF. She is unsure if the patient will progress and regain her strength. She reports feeling overwhelmed and having to be the decision maker due to her sibling living out of town.   Emotional Assessment Appearance:  Appears stated age Attitude/Demeanor/Rapport:    Affect (typically observed):  Calm, Accepting Orientation:  Oriented to Self, Oriented to Place Alcohol / Substance use:  Not Applicable Psych involvement (Current and /or in the community):  No (Comment)  Discharge Needs  Concerns to be addressed:  Discharge Planning Concerns Readmission within the last 30 days:  No Current discharge risk:  Dependent with Mobility Barriers to Discharge:  Continued Medical Work up, [place], LCSW

## 2017-07-20 LAB — PROTIME-INR
INR: 3.26
Prothrombin Time: 32.9 seconds — ABNORMAL HIGH (ref 11.4–15.2)

## 2017-07-20 LAB — CBC WITH DIFFERENTIAL/PLATELET
BASOS ABS: 0 10*3/uL (ref 0.0–0.1)
Basophils Relative: 0 %
EOS PCT: 2 %
Eosinophils Absolute: 0.2 10*3/uL (ref 0.0–0.7)
HCT: 26.7 % — ABNORMAL LOW (ref 36.0–46.0)
HEMOGLOBIN: 8.5 g/dL — AB (ref 12.0–15.0)
LYMPHS PCT: 6 %
Lymphs Abs: 0.5 10*3/uL — ABNORMAL LOW (ref 0.7–4.0)
MCH: 29.5 pg (ref 26.0–34.0)
MCHC: 31.8 g/dL (ref 30.0–36.0)
MCV: 92.7 fL (ref 78.0–100.0)
Monocytes Absolute: 0.6 10*3/uL (ref 0.1–1.0)
Monocytes Relative: 7 %
NEUTROS ABS: 7.7 10*3/uL (ref 1.7–7.7)
NEUTROS PCT: 85 %
Platelets: 182 10*3/uL (ref 150–400)
RBC: 2.88 MIL/uL — AB (ref 3.87–5.11)
RDW: 17 % — ABNORMAL HIGH (ref 11.5–15.5)
WBC: 9 10*3/uL (ref 4.0–10.5)

## 2017-07-20 LAB — BASIC METABOLIC PANEL
ANION GAP: 10 (ref 5–15)
BUN: 42 mg/dL — ABNORMAL HIGH (ref 6–20)
CO2: 24 mmol/L (ref 22–32)
Calcium: 8.2 mg/dL — ABNORMAL LOW (ref 8.9–10.3)
Chloride: 107 mmol/L (ref 101–111)
Creatinine, Ser: 1.47 mg/dL — ABNORMAL HIGH (ref 0.44–1.00)
GFR calc non Af Amer: 31 mL/min — ABNORMAL LOW (ref >60)
GFR, EST AFRICAN AMERICAN: 36 mL/min — AB (ref >60)
Glucose, Bld: 105 mg/dL — ABNORMAL HIGH (ref 65–99)
POTASSIUM: 3.2 mmol/L — AB (ref 3.5–5.1)
SODIUM: 141 mmol/L (ref 135–145)

## 2017-07-20 LAB — MAGNESIUM: Magnesium: 1.7 mg/dL (ref 1.7–2.4)

## 2017-07-20 LAB — GLUCOSE, CAPILLARY
GLUCOSE-CAPILLARY: 117 mg/dL — AB (ref 65–99)
Glucose-Capillary: 116 mg/dL — ABNORMAL HIGH (ref 65–99)
Glucose-Capillary: 98 mg/dL (ref 65–99)
Glucose-Capillary: 103 mg/dL — ABNORMAL HIGH (ref 65–99)
Glucose-Capillary: 106 mg/dL — ABNORMAL HIGH (ref 65–99)

## 2017-07-20 MED ORDER — POTASSIUM CHLORIDE CRYS ER 20 MEQ PO TBCR
40.0000 meq | EXTENDED_RELEASE_TABLET | ORAL | Status: DC
  Administered 2017-07-20: 40 meq via ORAL
  Filled 2017-07-20: qty 2

## 2017-07-20 MED ORDER — MORPHINE SULFATE (PF) 2 MG/ML IV SOLN: 2.0000 mg | INTRAVENOUS | Status: DC | PRN

## 2017-07-20 MED ORDER — FUROSEMIDE 40 MG PO TABS
40.0000 mg | ORAL_TABLET | Freq: Two times a day (BID) | ORAL | Status: DC
  Administered 2017-07-20 – 2017-07-22 (×4): 40 mg via ORAL
  Filled 2017-07-20 (×4): qty 1

## 2017-07-20 MED ORDER — ALPRAZOLAM 0.5 MG PO TABS: 0.5000 mg | ORAL_TABLET | Freq: Three times a day (TID) | ORAL | Status: DC | PRN

## 2017-07-20 MED ORDER — TRAZODONE HCL 50 MG PO TABS: 50.0000 mg | ORAL_TABLET | Freq: Every evening | ORAL | Status: DC | PRN

## 2017-07-20 NOTE — Progress Notes (Signed)
Daily Progress Note   Patient Name: [name]       Date: 07/20/2017 DOB: 1929-10-07  Age: 84 y.o. MRN#: [id number] Attending Physician: Aline August, MD Primary Care Physician: Midge Minium, MD Admit Date: 07/14/2017  Reason for Consultation/Follow-up: Establishing goals of care  Subjective: I met today with patient's daughter and son in law. We discussed clinical course as well as wishes moving forward in regard to advanced directives. Values and goals of care important to patient and family were attempted to be elicited.  [name] reports talking with Dr. Starla Link today her feeling is that her mother just does not have the reserve for rehab.    Concept of Hospice and Palliative Care were discussed  Questions and concerns addressed.   PMT will continue to support holistically.  See below.  Length of Stay: 6  Current Medications: Scheduled Meds:  . allopurinol  100 mg Oral Daily  . amLODipine  10 mg Oral QHS  . atorvastatin  20 mg Oral QHS  . dextromethorphan-guaiFENesin  1 tablet Oral BID  . feeding supplement  1 Container Oral TID BM  . fluticasone  2 spray Each Nare Daily  . furosemide  40 mg Oral BID  . hydrALAZINE  100 mg Oral TID  . insulin aspart  0-15 Units Subcutaneous Q4H  . levothyroxine  50 mcg Oral Daily  . loratadine  10 mg Oral Daily  . multivitamin with minerals   Oral Daily  . pantoprazole  40 mg Oral BID AC    Continuous Infusions:   PRN Meds: acetaminophen **OR** acetaminophen, ALPRAZolam, guaiFENesin, ipratropium-albuterol, morphine injection, ondansetron **OR** ondansetron (ZOFRAN) IV, oxyCODONE, traZODone  Physical Exam     Constitutional: She is easily aroused. She appears ill.  HENT:  Head: Normocephalic and atraumatic.    Pulmonary/Chest: No accessory muscle usage. No tachypnea. No respiratory distress.  Neurological: She is easily aroused.  Wakes to voice, drowsy, pleasantly confused.  Skin: Skin is warm and dry.  Psychiatric: Her speech is delayed. She is inattentive.  Nursing note and vitals reviewed.  Vital Signs: BP (!) 117/30   Pulse (!) 50   Temp 98.4 F (36.9 C) (Oral)   Resp 16   Ht _0  (1.499 m)   Wt 74.8 kg (165 lb)   SpO2 98%   BMI 33.33  kg/m  SpO2: SpO2: 98 % O2 Device: O2 Device: Nasal Cannula O2 Flow Rate: O2 Flow Rate (L/min): 2 L/min  Intake/output summary:   Intake/Output Summary (Last 24 hours) at 07/20/2017 1825 Last data filed at 07/20/2017 1300 Gross per 24 hour  Intake 60 ml  Output 0 ml  Net 60 ml   LBM: Last BM Date: 07/12/17 Baseline Weight: Weight: 74.8 kg (165 lb) Most recent weight: Weight: 74.8 kg (165 lb)       Palliative Assessment/Data:    Flowsheet Rows     Most Recent Value  Intake Tab  Referral Department  Hospitalist  Unit at Time of Referral  Med/Surg Unit  Palliative Care Primary Diagnosis  -- [dementia, PNA, UTI, diastolic CHF]  Palliative Care Type  New Palliative care  Reason for referral  Clarify Goals of Care  Date first seen by Palliative Care  07/18/17  Clinical Assessment  Palliative Performance Scale Score  40%  Psychosocial & Spiritual Assessment  Palliative Care Outcomes  Patient/Family meeting held?  Yes  Who was at the meeting?  daughter  Palliative Care Outcomes  Clarified goals of care, Counseled regarding hospice, Provided psychosocial or spiritual support, ACP counseling assistance, Provided end of life care assistance, Linked to palliative care logitudinal support, Provided advance care planning      Patient Active Problem List   Diagnosis Date Noted  . Palliative care by specialist   . GI bleed 07/15/2017  . Supratherapeutic INR 07/15/2017  . Acute UTI 07/15/2017  . AKI (acute kidney injury) ([place])   . Cough    . Dehydration   . Warfarin-induced coagulopathy ([place])   . Community acquired pneumonia 07/14/2017  . Chronic diarrhea 05/06/2017  . Dementia 05/06/2017  . Long term (current) use of anticoagulants 12/07/2016  . Goals of care, counseling/discussion 06/01/2015  . Gout of right wrist 04/12/2015  . Chronic diastolic CHF (congestive heart failure), NYHA class 1 ([place]) 03/12/2015  . Tremor of both hands 01/14/2014  . BRBPR (bright red blood per rectum) 01/14/2014  . Asthma with acute exacerbation 12/10/2012  . Osteopenia 08/16/2011  . General medical examination 08/16/2011  . Bronchitis 03/08/2011  . Hyperlipidemia 02/21/2011  . Anemia 12/29/2010  . Abnormal LFTs 12/29/2010  . Current use of long term anticoagulation 12/29/2010  . Bradycardia 09/09/2010  . Atrial fibrillation ([place]) 04/26/2010  . History of diet-controlled diabetes 11/09/2009  . CAROTID BRUIT, LEFT 11/09/2009  . INTRINSIC ASTHMA, UNSPECIFIED 09/27/2009  . DYSPNEA ON EXERTION 08/19/2009  . KNEE PAIN 07/20/2009  . EDEMA 07/20/2009  . OBESITY 12/07/2008  . Essential hypertension 03/24/2008  . GERD 03/24/2008    Palliative Care Assessment & Plan   Patient Profile: 84 y.o. female with past medical history of dementia, atrial fibrillation on coumadin, CHF, CKD, DM type 2, GERD, obesity admitted on 07/14/2017 with fever, weakness, and dark stools. Patient admitted for community acquired pneumonia, UTI, GI bleeding with subtherapeutic INR, and possible diastolic CHF exacerbation. Patient treated with 5 day course of rocephin and zithromax (discontinued on 5/23). ECHO showed EF of 55-60% with grade 2 diastolic dysfunction. Receiving IV lasix. Palliative medicine consultation for goals of care.     Assessment: Patient Active Problem List   Diagnosis Date Noted  . Palliative care by specialist   . GI bleed 07/15/2017  . Supratherapeutic INR 07/15/2017  . Acute UTI 07/15/2017  . AKI (acute kidney injury) ([place])   . Cough    . Dehydration   . Warfarin-induced coagulopathy ([place])   .  Community acquired pneumonia 07/14/2017  . Chronic diarrhea 05/06/2017  . Dementia 05/06/2017  . Long term (current) use of anticoagulants 12/07/2016  . Goals of care, counseling/discussion 06/01/2015  . Gout of right wrist 04/12/2015  . Chronic diastolic CHF (congestive heart failure), NYHA class 1 ([place]) 03/12/2015  . Tremor of both hands 01/14/2014  . BRBPR (bright red blood per rectum) 01/14/2014  . Asthma with acute exacerbation 12/10/2012  . Osteopenia 08/16/2011  . General medical examination 08/16/2011  . Bronchitis 03/08/2011  . Hyperlipidemia 02/21/2011  . Anemia 12/29/2010  . Abnormal LFTs 12/29/2010  . Current use of long term anticoagulation 12/29/2010  . Bradycardia 09/09/2010  . Atrial fibrillation ([place]) 04/26/2010  . History of diet-controlled diabetes 11/09/2009  . CAROTID BRUIT, LEFT 11/09/2009  . INTRINSIC ASTHMA, UNSPECIFIED 09/27/2009  . DYSPNEA ON EXERTION 08/19/2009  . KNEE PAIN 07/20/2009  . EDEMA 07/20/2009  . OBESITY 12/07/2008  . Essential hypertension 03/24/2008  . GERD 03/24/2008     Recommendations/Plan:  Family is now interested in options for hospice care on discharge.  Daughter's concern is maintaining comfort and dignity in safe care environment.  At this point, she reports that she is concerned about her ability to care for her mother at home.  She states that there are not resources for private pay for long term custodial care with hospice support.  She would like to discuss hospice options with [facility] as her mother is currently seen by [facility].  Discussed with SW and appreciate SW assistance.    Goals of Care and Additional Recommendations:  Limitations on Scope of Treatment: Full Comfort Care  Code Status:    Code Status Orders  (From admission, onward)        Start     Ordered   07/15/17 1059  Do not attempt resuscitation (DNR)   Continuous    Question Answer Comment  In the event of cardiac or respiratory ARREST Do not call a "code blue"   In the event of cardiac or respiratory ARREST Do not perform Intubation, CPR, defibrillation or ACLS   In the event of cardiac or respiratory ARREST Use medication by any route, position, wound care, and other measures to relive pain and suffering. May use oxygen, suction and manual treatment of airway obstruction as needed for comfort.      07/15/17 1058    Code Status History    Date Active Date Inactive Code Status Order ID Comments User Context   07/14/2017 2208 07/15/2017 1058 Full Code [id number]  Harvie Bridge, DO ED   03/12/2015 2000 03/16/2015 0149 Full Code [id number]  Toy Baker, MD Inpatient    Advance Directive Documentation     Most Recent Value  Type of Advance Directive  Healthcare Power of Attorney, Living will  Pre-existing out of facility DNR order (yellow form or pink MOST form)  -  "MOST" Form in Place?  -       Prognosis:   < 6 months  Discharge Planning:  To Be Determined  Care plan was discussed with daughter, Dr. Starla Link, SW, RN  Thank you for allowing the Palliative Medicine Team to assist in the care of this patient.   Total Time 50 Prolonged Time Billed No      Greater than 50%  of this time was spent counseling and coordinating care related to the above assessment and plan.  Micheline Rough, MD  Please contact Palliative Medicine Team phone at [phone] for questions and concerns.

## 2017-07-20 NOTE — Progress Notes (Addendum)
Patient ID: [name], female   DOB: Aug 28, 1929, 84 y.o.   MRN: [id number] Spoke to patient's daughter Carmin Richmond on phone and discussed the overall poor prognosis of Ms. [name].  Patient's overall condition is not improving with very poor appetite and severe deconditioning.  Patient's daughter has agreed for comfort measures/hospice.  Will not order further labs or imaging studies.  Change IV Lasix to oral to make her comfortable.  Will reach out to social worker to get hospice involved.  Will discontinue Coumadin moving ahead.

## 2017-07-20 NOTE — Progress Notes (Signed)
Patient ID: [name], female   DOB: February 13, 1930, 84 y.o.   MRN: [id number]  PROGRESS NOTE    LADAIJA DIMINO  WUJ:[id number] DOB: May 27, 1929 DOA: 07/14/2017 PCP: Sheliah Hatch, MD   Brief Narrative:  84 year old female with history of atrial fibrillation on Coumadin, CKD 3, diabetes mellitus type 2, hypertension, GERD, hyperlipidemia, question of CHF presented on 07/14/2017 with for evaluation of fever along with dark stools.  Patient was admitted for multiple medical problems including committee acquired pneumonia, UTI, GI bleed with subtherapeutic INR along with possibly diastolic CHF exacerbation.  Assessment & Plan:   Active Problems:   Essential hypertension   GERD   Atrial fibrillation ([facility])   Anemia   Hyperlipidemia   Chronic diastolic CHF (congestive heart failure), NYHA class 1 (H[facility])   Goals of care, counseling/discussion   Chronic diarrhea   Dementia   Community acquired pneumonia   GI bleed   Supratherapeutic INR   Acute UTI   AKI (acute kidney injury) ([facility])   Cough   Dehydration   Warfarin-induced coagulopathy ([facility])   Palliative care by specialist   Community-acquired pneumonia -Blood cultures negative so far.  Treated with 5-day course of Rocephin and Zithromax which were discontinued on 07/18/2017 -Currently on 3 L nasal cannula oxygen.  Wean as able -Incentive spirometry  UTI with Klebsiella oxytoca -Completed antibiotic course  Acute on chronic normocytic anemia with acute blood loss anemia -Hemoglobin 8.5 today.  Transfuse if hemoglobin less than 7  Probable upper GI bleeding presenting with dark stools -Continue oral Protonix.  prior hospitalist discussed with patient's daughter regarding endoscopy or colonoscopy.  Patient's daughter did not wish to have any procedures for now.  Family should consider discontinuing Coumadin on discharge as well -Palliative care following.    Supratherapeutic INR -INR is 3.26 today.  No overt bleeding.  Hold  Coumadin.  Repeat a.m. Labs  Probable acute on chronic diastolic CHF -Echo showed EF of 55 to 60% with grade 2 diastolic dysfunction -Strict input and output.  Daily weights.  Continue Lasix 40 milligrams IV every 12 hours.  Patient is having good urine output.  Will repeat chest x-ray in a.m.   Chronic kidney disease stage IV -Creatinine stable.  Monitor  Essential hypertension -Monitor blood pressure.  Continue amlodipine and hydralazine with holding parameters  Hyperlipidemia -Continue statin  Hypothyroidism -Continue Synthroid  Diabetes mellitus type 2 -Diet controlled.  Continue Accu-Cheks  dementia -Stable.  Fall precautions  Chronic atrial fibrillation -Currently bradycardic.  Coumadin on hold  Hypokalemia -Replace.  Repeat a.m. Labs   Deconditioning -Overall prognosis is guarded to poor.  Consider hospice.     DVT prophylaxis: Coumadin on hold Code Status: DNR Family Communication: None at bedside Disposition Plan: Nursing home in 1 to 2 days  Consultants: None Procedures:  Echo - Left ventricle: The cavity size was normal. Systolic function was   normal. The estimated ejection fraction was in the range of 55%   to 60%. Wall motion was normal; there were no regional wall   motion abnormalities. Features are consistent with a pseudonormal   left ventricular filling pattern, with concomitant abnormal   relaxation and increased filling pressure (grade 2 diastolic   dysfunction). - Mitral valve: There was mild regurgitation. - Left atrium: The atrium was severely dilated. - Right atrium: The atrium was severely dilated. - Pulmonary arteries: Systolic pressure was moderately to severely   increased. PA peak pressure: 66 mm Hg (S). - Pericardium, extracardiac: A trivial pericardial  effusion was   identified posterior to the heart.  Antimicrobials:  Rocephin and Zithromax from 07/14/2017-07/18/2017   Subjective: Patient seen and examined at bedside.  No  overnight fever, nausea, vomiting or black or bloody stools reported.  Patient is awake but a poor historian.  Does not feel well.  Objective: Vitals:   07/19/17 1345 07/19/17 1959 07/20/17 0526 07/20/17 1031  BP: (!) 143/42 (!) 177/39 (!) 127/42 (!) 146/50  Pulse: (!) 43 (!) 47 (!) 52   Resp: Temp: 98.6 F (37 C) 98.2 F (36.8 C) 98.8 F (37.1 C)   TempSrc: Oral Oral    SpO2: 96%     Weight:      Height:        Intake/Output Summary (Last 24 hours) at 07/20/2017 1055 Last data filed at 07/19/2017 2158 Gross per 24 hour  Intake 480 ml  Output 300 ml  Net 180 ml   Filed Weights   07/14/17 1420  Weight: 74.8 kg (165 lb)    Examination:  General exam: Elderly female lying in bed.  Awake but a poor historian.  No distress Respiratory system: Bilateral decreased breath sounds at bases with basilar crackles, no wheezing cardiovascular system: Rate controlled, S1-S2 positive gastrointestinal system: Abdomen is nondistended, soft and nontender. Normal bowel sounds heard. Extremities: Trace edema, no cyanosis   Data Reviewed: I have personally reviewed following labs and imaging studies  CBC: Recent Labs  Lab 07/16/17 0416 07/17/17 0414 07/18/17 0505 07/19/17 0416 07/20/17 0409  WBC 8.5 7.4 7.0 7.8 9.0  NEUTROABS  --   --  5.3 6.2 7.7  HGB 8.0* 8.4* 7.9* 8.0* 8.5*  HCT 25.2* 25.7* 24.5* 24.4* 26.7*  MCV 92.6 90.8 91.4 91.0 92.7  PLT 196 199 180 192 182   Basic Metabolic Panel: Recent Labs  Lab 07/16/17 0416 07/17/17 0414 07/18/17 0505 07/19/17 0416 07/20/17 0409  NA 143 140 140 142 141  K 3.7 4.0 3.3* 3.6 3.2*  CL 110 109 108 109 107  CO2 21* 21* GLUCOSE 94 95 108* 101* 105*  BUN 67* 59* 50* 43* 42*  CREATININE 1.73* 1.71* 1.62* 1.57* 1.47*  CALCIUM 8.4* 8.0* 8.1* 7.9* 8.2*  MG  --   --  2.1 1.9 1.7   GFR: Estimated Creatinine Clearance: 24.2 mL/min (A) (by C-G formula based on SCr of 1.47 mg/dL (H)). Liver Function Tests: Recent  Labs  Lab 07/14/17 1442 07/15/17 0313  AST 242* 211*  ALT 106* 100*  ALKPHOS 84 76  BILITOT 0.8 0.5  PROT 7.3 6.6  ALBUMIN 3.2* 3.0*   No results for input(s): LIPASE, AMYLASE in the last 168 hours. No results for input(s): AMMONIA in the last 168 hours. Coagulation Profile: Recent Labs  Lab 07/16/17 0416 07/17/17 0414 07/18/17 0505 07/19/17 0416 07/20/17 0409  INR 4.24* 5.20* 5.13* 4.46* 3.26   Cardiac Enzymes: No results for input(s): CKTOTAL, CKMB, CKMBINDEX, TROPONINI in the last 168 hours. BNP (last 3 results) No results for input(s): PROBNP in the last 8760 hours. HbA1C: No results for input(s): HGBA1C in the last 72 hours. CBG: Recent Labs  Lab 07/19/17 1531 07/19/17 1925 07/19/17 2344 07/20/17 0358 07/20/17 0740  GLUCAP 189* 202* 94 116* 98   Lipid Profile: No results for input(s): CHOL, HDL, LDLCALC, TRIG, CHOLHDL, LDLDIRECT in the last 72 hours. Thyroid Function Tests: No results for input(s): TSH, T4TOTAL, FREET4, T3FREE, THYROIDAB in the last 72 hours. Anemia Panel: No results for  input(s): VITAMINB12, FOLATE, FERRITIN, TIBC, IRON, RETICCTPCT in the last 72 hours. Sepsis Labs: Recent Labs  Lab 07/14/17 1456 07/14/17 1639  LATICACIDVEN 2.36* 1.00    Recent Results (from the past 240 hour(s))  Blood Culture (routine x 2)     Status: None   Collection Time: 07/14/17  3:23 PM  Result Value Ref Range Status   Specimen Description   Final    BLOOD LEFT FOREARM Performed at [facility], [address], [place], [place] [address]    Special Requests   Final    BOTTLES DRAWN AEROBIC AND ANAEROBIC Blood Culture results may not be optimal due to an excessive volume of blood received in culture bottles Performed at [facility], [address] [place], [place] [address]    Culture   Final    NO GROWTH 5 DAYS Performed at [facility], [address], [place], [place] [address]    Report Status 07/19/2017 FINAL   Final  Blood Culture (routine x 2)     Status: None   Collection Time: 07/14/17  4:38 PM  Result Value Ref Range Status   Specimen Description   Final    BLOOD RIGHT ANTECUBITAL Performed at [facility], [address], [place], [place] [address]    Special Requests   Final    BOTTLES DRAWN AEROBIC AND ANAEROBIC Blood Culture results may not be optimal due to an excessive volume of blood received in culture bottles Performed at [facility], [address], [place], [place] [address]    Culture   Final    NO GROWTH 5 DAYS Performed at [facility], [address] [place], [place] [address]    Report Status 07/19/2017 FINAL  Final  Urine Culture     Status: Abnormal   Collection Time: 07/14/17  6:20 PM  Result Value Ref Range Status   Specimen Description   Final    URINE, RANDOM Performed at [facility], [address] [place], [place] [address]    Special Requests   Final    NONE Performed at [facility], [address], [place], [place] [address]    Culture >=100,000 COLONIES/mL KLEBSIELLA OXYTOCA (A)  Final   Report Status 07/17/2017 FINAL  Final   Organism ID, Bacteria KLEBSIELLA OXYTOCA (A)  Final      Susceptibility   Klebsiella oxytoca - MIC*    AMPICILLIN >=32 RESISTANT Resistant     CEFAZOLIN 8 SENSITIVE Sensitive     CEFTRIAXONE <=1 SENSITIVE Sensitive     CIPROFLOXACIN <=0.25 SENSITIVE Sensitive     GENTAMICIN <=1 SENSITIVE Sensitive     IMIPENEM <=0.25 SENSITIVE Sensitive     NITROFURANTOIN <=16 SENSITIVE Sensitive     TRIMETH/SULFA <=20 SENSITIVE Sensitive     AMPICILLIN/SULBACTAM 8 SENSITIVE Sensitive     PIP/TAZO <=4 SENSITIVE Sensitive     Extended ESBL NEGATIVE Sensitive     * >=100,000 COLONIES/mL KLEBSIELLA OXYTOCA         Radiology Studies: No results found.      Scheduled Meds: . allopurinol  100 mg Oral Daily  . amLODipine  10 mg Oral QHS  .  atorvastatin  20 mg Oral QHS  . dextromethorphan-guaiFENesin  1 tablet Oral BID  . feeding supplement  1 Container Oral TID BM  . fluticasone  2 spray Each Nare Daily  . furosemide  40 mg Intravenous BID  . hydrALAZINE  100 mg Oral TID  . insulin aspart  0-15 Units Subcutaneous Q4H  . levothyroxine  50 mcg Oral Daily  . loratadine  10 mg Oral Daily  . multivitamin with minerals   Oral Daily  . pantoprazole  40 mg Oral BID AC  . potassium chloride  40 mEq Oral Q4H   Continuous Infusions:    LOS: 6 days        Glade Lloyd, MD [facility] Pager [phone]  If 7PM-7AM, please contact night-coverage [email or web address] Password [facility] 07/20/2017, 10:55 AM

## 2017-07-20 NOTE — Progress Notes (Addendum)
CSW received consult to assist with hospice referral. Unit CSW has been following during pt's hospitalization for potential SNF placement (for rehab), however family has now decided to pursue comfort care. Left voicemail for daughter to discuss disposition plan/wishes given changes in goals/needs.   Ilean Skill, MSW, LCSW Clinical Social Work 07/20/2017 [phone] weekend coverage  Daughter called CSW back and discussed options- daughter states SNF placement would be financial hardship and likely not possible- states she is considering applying for Medicaid for pt in order to potentially have long term care payor source in coming weeks/months.  States she is also concerned that care at home will be more than she can manage. Pt has [employer] [employer] at home and therefore requested [facility] be the agency used whether inpatient or at home. Requested to have CSW inform liaison of her needs so that they can meet and discuss what hospice care can be provided at home prior to making disposition plan. CSW made referral and will follow.

## 2017-07-21 LAB — GLUCOSE, CAPILLARY
GLUCOSE-CAPILLARY: 108 mg/dL — AB (ref 65–99)
Glucose-Capillary: 102 mg/dL — ABNORMAL HIGH (ref 65–99)
Glucose-Capillary: 104 mg/dL — ABNORMAL HIGH (ref 65–99)
Glucose-Capillary: 109 mg/dL — ABNORMAL HIGH (ref 65–99)
Glucose-Capillary: 160 mg/dL — ABNORMAL HIGH (ref 65–99)
Glucose-Capillary: 248 mg/dL — ABNORMAL HIGH (ref 65–99)

## 2017-07-21 NOTE — Social Work (Addendum)
CSW received call from [facility] indicating that they will make determination with family tomorrow, inpatient vs home, as they are following.  CSW will continue to follow for support.  Keene Breath, LCSW Clinical Social Worker-Weekend [facility] [place] [phone]

## 2017-07-21 NOTE — Progress Notes (Signed)
Patient ID: [name], female   DOB: 11-27-1929, 84 y.o.   MRN: [id number]  PROGRESS NOTE    KIWANA DEBLASI  ION:[id number] DOB: 1929/07/15 DOA: 07/14/2017 PCP: Sheliah Hatch, MD   Brief Narrative:  84 year old female with history of atrial fibrillation on Coumadin, CKD 3, diabetes mellitus type 2, hypertension, GERD, hyperlipidemia, question of CHF presented on 07/14/2017 with for evaluation of fever along with dark stools.  Patient was admitted for multiple medical problems including community acquired pneumonia, UTI, GI bleed with supratherapeutic INR along with possibly diastolic CHF exacerbation.  Patient was treated with intravenous antibiotics and completed course, urine culture grew Klebsiella.  Patient was empirically started on intravenous Protonix which was switched to oral Protonix.  Family preferred no endoscopic intervention.  Palliative care was consulted for overall poor prognosis.  Patient was also put on intravenous Lasix which was transitioned to oral Lasix.  Family has decided to pursue hospice/comfort measures.  Lab works and imaging studies have been discontinued. Assessment & Plan:   Active Problems:   Essential hypertension   GERD   Atrial fibrillation (HCC)   Anemia   Hyperlipidemia   Chronic diastolic CHF (congestive heart failure), NYHA class 1 (HCC)   Goals of care, counseling/discussion   Chronic diarrhea   Dementia   Community acquired pneumonia   GI bleed   Supratherapeutic INR   Acute UTI   AKI (acute kidney injury) (HCC)   Cough   Dehydration   Warfarin-induced coagulopathy (HCC)   Palliative care by specialist   Community-acquired pneumonia -Blood cultures negative so far.  Treated with 5-day course of Rocephin and Zithromax which were discontinued on 07/18/2017 -Currently on 2 L nasal cannula oxygen.  Wean as able -Incentive spirometry  UTI with Klebsiella oxytoca -Completed antibiotic course  Acute on chronic normocytic anemia with acute  blood loss anemia -Will not order further  lab works  Probable upper GI bleeding presenting with dark stools -Continue oral Protonix.  prior hospitalist discussed with patient's daughter regarding endoscopy or colonoscopy.  Patient's daughter did not wish to have any procedures -Coumadin has been discontinued.  Supratherapeutic INR -No further lab works.  Coumadin discontinued  Probable acute on chronic diastolic CHF -Echo showed EF of 55 to 60% with grade 2 diastolic dysfunction -Lasix has been switched to oral for hospice/comfort measures  Chronic kidney disease stage IV -No further lab work  Essential hypertension -Monitor blood pressure.  Continue amlodipine and hydralazine with holding parameters  Hyperlipidemia -Continue statin  Hypothyroidism -Continue Synthroid  Diabetes mellitus type 2 -Diet controlled.  Will discontinue Accu-Cheks  dementia -Stable.  Fall precautions  Chronic atrial fibrillation -Currently bradycardic.  Coumadin discontinued  Hypokalemia -No further lab work   Deconditioning -Overall prognosis is guarded to poor.  Will follow-up with social worker regarding discharge planning.  Family has decided to pursue comfort measures/hospice.     DVT prophylaxis: None due to comfort measures Code Status: DNR Family Communication: None at bedside Disposition Plan: Home versus residential hospice  Consultants: None Procedures:  Echo - Left ventricle: The cavity size was normal. Systolic function was   normal. The estimated ejection fraction was in the range of 55%   to 60%. Wall motion was normal; there were no regional wall   motion abnormalities. Features are consistent with a pseudonormal   left ventricular filling pattern, with concomitant abnormal   relaxation and increased filling pressure (grade 2 diastolic   dysfunction). - Mitral valve: There was mild regurgitation. - Left  atrium: The atrium was severely dilated. - Right atrium: The  atrium was severely dilated. - Pulmonary arteries: Systolic pressure was moderately to severely   increased. PA peak pressure: 66 mm Hg (S). - Pericardium, extracardiac: A trivial pericardial effusion was   identified posterior to the heart.  Antimicrobials:  Rocephin and Zithromax from 07/14/2017-07/18/2017   Subjective: Patient seen and examined at bedside.  Patient denies any new complaints.  Poor historian.  Denies current pain Objective: Vitals:   07/20/17 1658 07/20/17 2035 07/21/17 0522 07/21/17 0523  BP: (!) 117/30 (!) 133/43 (!) 134/36   Pulse:  (!) 42 (!) 51   Resp:  19 18   Temp:  98.8 F (37.1 C) 98.8 F (37.1 C) 99.3 F (37.4 C)  TempSrc:  Oral Axillary Oral  SpO2:  97% 97%   Weight:      Height:        Intake/Output Summary (Last 24 hours) at 07/21/2017 0952 Last data filed at 07/21/2017 0944 Gross per 24 hour  Intake 900 ml  Output 0 ml  Net 900 ml   Filed Weights   07/14/17 1420  Weight: 74.8 kg (165 lb)    Examination:  General exam: Elderly female lying in bed.  Awake but a poor historian.  No acute distress Respiratory system: Bilateral decreased breath sounds at bases with basilar crackles cardiovascular system: Rate controlled, S1-S2 positive gastrointestinal system: Abdomen is nondistended, soft and nontender. Normal bowel sounds heard. Extremities: Trace edema, no cyanosis   Data Reviewed: I have personally reviewed following labs and imaging studies  CBC: Recent Labs  Lab 07/16/17 0416 07/17/17 0414 07/18/17 0505 07/19/17 0416 07/20/17 0409  WBC 8.5 7.4 7.0 7.8 9.0  NEUTROABS  --   --  5.3 6.2 7.7  HGB 8.0* 8.4* 7.9* 8.0* 8.5*  HCT 25.2* 25.7* 24.5* 24.4* 26.7*  MCV 92.6 90.8 91.4 91.0 92.7  PLT 196 199 180 192 182   Basic Metabolic Panel: Recent Labs  Lab 07/16/17 0416 07/17/17 0414 07/18/17 0505 07/19/17 0416 07/20/17 0409  NA 143 140 140 142 141  K 3.7 4.0 3.3* 3.6 3.2*  CL 110 109 108 109 107  CO2 21* 21* GLUCOSE 94 95 108* 101* 105*  BUN 67* 59* 50* 43* 42*  CREATININE 1.73* 1.71* 1.62* 1.57* 1.47*  CALCIUM 8.4* 8.0* 8.1* 7.9* 8.2*  MG  --   --  2.1 1.9 1.7   GFR: Estimated Creatinine Clearance: 24.2 mL/min (A) (by C-G formula based on SCr of 1.47 mg/dL (H)). Liver Function Tests: Recent Labs  Lab 07/14/17 1442 07/15/17 0313  AST 242* 211*  ALT 106* 100*  ALKPHOS 84 76  BILITOT 0.8 0.5  PROT 7.3 6.6  ALBUMIN 3.2* 3.0*   No results for input(s): LIPASE, AMYLASE in the last 168 hours. No results for input(s): AMMONIA in the last 168 hours. Coagulation Profile: Recent Labs  Lab 07/16/17 0416 07/17/17 0414 07/18/17 0505 07/19/17 0416 07/20/17 0409  INR 4.24* 5.20* 5.13* 4.46* 3.26   Cardiac Enzymes: No results for input(s): CKTOTAL, CKMB, CKMBINDEX, TROPONINI in the last 168 hours. BNP (last 3 results) No results for input(s): PROBNP in the last 8760 hours. HbA1C: No results for input(s): HGBA1C in the last 72 hours. CBG: Recent Labs  Lab 07/20/17 1619 07/20/17 2009 07/21/17 0003 07/21/17 0412 07/21/17 0725  GLUCAP 117* 106* 109* 104* 102*   Lipid Profile: No results for input(s): CHOL, HDL, LDLCALC, TRIG, CHOLHDL, LDLDIRECT in the last  72 hours. Thyroid Function Tests: No results for input(s): TSH, T4TOTAL, FREET4, T3FREE, THYROIDAB in the last 72 hours. Anemia Panel: No results for input(s): VITAMINB12, FOLATE, FERRITIN, TIBC, IRON, RETICCTPCT in the last 72 hours. Sepsis Labs: Recent Labs  Lab 07/14/17 1456 07/14/17 1639  LATICACIDVEN 2.36* 1.00    Recent Results (from the past 240 hour(s))  Blood Culture (routine x 2)     Status: None   Collection Time: 07/14/17  3:23 PM  Result Value Ref Range Status   Specimen Description   Final    BLOOD LEFT FOREARM Performed at [facility], [address] [place], [place] [address]    Special Requests   Final    BOTTLES DRAWN AEROBIC AND ANAEROBIC Blood Culture results may not be optimal due to an  excessive volume of blood received in culture bottles Performed at [facility], [address], [place], [place] [address]    Culture   Final    NO GROWTH 5 DAYS Performed at [facility], [address], [place], [place] [address]    Report Status 07/19/2017 FINAL  Final  Blood Culture (routine x 2)     Status: None   Collection Time: 07/14/17  4:38 PM  Result Value Ref Range Status   Specimen Description   Final    BLOOD RIGHT ANTECUBITAL Performed at [facility], [address], [place], [place] [address]    Special Requests   Final    BOTTLES DRAWN AEROBIC AND ANAEROBIC Blood Culture results may not be optimal due to an excessive volume of blood received in culture bottles Performed at [facility], [address] [place], [place] [address]    Culture   Final    NO GROWTH 5 DAYS Performed at [facility], [address], [place], [place] [address]    Report Status 07/19/2017 FINAL  Final  Urine Culture     Status: Abnormal   Collection Time: 07/14/17  6:20 PM  Result Value Ref Range Status   Specimen Description   Final    URINE, RANDOM Performed at [facility], [address] [place], [place] [address]    Special Requests   Final    NONE Performed at [facility], [address], [place], [place] [address]    Culture >=100,000 COLONIES/mL KLEBSIELLA OXYTOCA (A)  Final   Report Status 07/17/2017 FINAL  Final   Organism ID, Bacteria KLEBSIELLA OXYTOCA (A)  Final      Susceptibility   Klebsiella oxytoca - MIC*    AMPICILLIN >=32 RESISTANT Resistant     CEFAZOLIN 8 SENSITIVE Sensitive     CEFTRIAXONE <=1 SENSITIVE Sensitive     CIPROFLOXACIN <=0.25 SENSITIVE Sensitive     GENTAMICIN <=1 SENSITIVE Sensitive     IMIPENEM <=0.25 SENSITIVE Sensitive     NITROFURANTOIN <=16 SENSITIVE Sensitive     TRIMETH/SULFA <=20 SENSITIVE Sensitive     AMPICILLIN/SULBACTAM 8  SENSITIVE Sensitive     PIP/TAZO <=4 SENSITIVE Sensitive     Extended ESBL NEGATIVE Sensitive     * >=100,000 COLONIES/mL KLEBSIELLA OXYTOCA         Radiology Studies: No results found.      Scheduled Meds: . allopurinol  100 mg Oral Daily  . amLODipine  10 mg Oral QHS  . atorvastatin  20 mg Oral QHS  . dextromethorphan-guaiFENesin  1 tablet Oral BID  . feeding supplement  1 Container  Oral TID BM  . fluticasone  2 spray Each Nare Daily  . furosemide  40 mg Oral BID  . hydrALAZINE  100 mg Oral TID  . insulin aspart  0-15 Units Subcutaneous Q4H  . levothyroxine  50 mcg Oral Daily  . loratadine  10 mg Oral Daily  . multivitamin with minerals   Oral Daily  . pantoprazole  40 mg Oral BID AC   Continuous Infusions:    LOS: 7 days        Glade Lloyd, MD [facility] Pager [phone]  If 7PM-7AM, please contact night-coverage [email or web address] Password [facility] 07/21/2017, 9:52 AM

## 2017-07-21 NOTE — Progress Notes (Signed)
Daily Progress Note   Patient Name: [name]       Date: 07/21/2017 DOB: Dec 15, 1929  Age: 84 y.o. MRN#: [id number] Attending Physician: Aline August, MD Primary Care Physician: Midge Minium, MD Admit Date: 07/14/2017  Reason for Consultation/Follow-up: Establishing goals of care  Subjective: I met today with patient's daughter.  Await meeting with [place].  They have appt scheduled this afternoon.  Questions and concerns addressed.   PMT will continue to support holistically.  See below.  Length of Stay: 7  Current Medications: Scheduled Meds:  . allopurinol  100 mg Oral Daily  . amLODipine  10 mg Oral QHS  . atorvastatin  20 mg Oral QHS  . dextromethorphan-guaiFENesin  1 tablet Oral BID  . feeding supplement  1 Container Oral TID BM  . fluticasone  2 spray Each Nare Daily  . furosemide  40 mg Oral BID  . hydrALAZINE  100 mg Oral TID  . insulin aspart  0-15 Units Subcutaneous Q4H  . levothyroxine  50 mcg Oral Daily  . loratadine  10 mg Oral Daily  . multivitamin with minerals   Oral Daily  . pantoprazole  40 mg Oral BID AC    Continuous Infusions:   PRN Meds: acetaminophen **OR** acetaminophen, ALPRAZolam, guaiFENesin, ipratropium-albuterol, morphine injection, ondansetron **OR** ondansetron (ZOFRAN) IV, oxyCODONE, traZODone  Physical Exam     Constitutional: She is easily aroused. She appears ill.  HENT:  Head: Normocephalic and atraumatic.  Pulmonary/Chest: No accessory muscle usage. No tachypnea. No respiratory distress.  Neurological: She is easily aroused.  Wakes to voice, drowsy, pleasantly confused.  Skin: Skin is warm and dry.  Psychiatric: Her speech is delayed. She is inattentive.  Nursing note and vitals reviewed.  Vital  Signs: BP (!) 134/36 (BP Location: Right Arm)   Pulse (!) 51   Temp 98.8 F (37.1 C) (Oral)   Resp 18   Ht _0  (1.499 m)   Wt 74.8 kg (165 lb)   SpO2 97%   BMI 33.33 kg/m  SpO2: SpO2: 97 % O2 Device: O2 Device: Nasal Cannula O2 Flow Rate: O2 Flow Rate (L/min): 2 L/min  Intake/output summary:   Intake/Output Summary (Last 24 hours) at 07/21/2017 1251 Last data filed at 07/21/2017 0944 Gross per 24 hour  Intake 900 ml  Output 0 ml  Net 900 ml   LBM: Last BM Date: 07/21/17 Baseline Weight: Weight: 74.8 kg (165 lb) Most recent weight: Weight: 74.8 kg (165 lb)       Palliative Assessment/Data:    Flowsheet Rows     Most Recent Value  Intake Tab  Referral Department  Hospitalist  Unit at Time of Referral  Med/Surg Unit  Palliative Care Primary Diagnosis  -- [dementia, PNA, UTI, diastolic CHF]  Palliative Care Type  New Palliative care  Reason for referral  Clarify Goals of Care  Date first seen by Palliative Care  07/18/17  Clinical Assessment  Palliative Performance Scale Score  40%  Psychosocial & Spiritual Assessment  Palliative Care Outcomes  Patient/Family meeting held?  Yes  Who was at the meeting?  daughter  Palliative Care Outcomes  Clarified goals of care, Counseled regarding hospice, Provided psychosocial or spiritual support, ACP counseling assistance, Provided end of life care assistance, Linked to palliative care logitudinal support, Provided advance care planning      Patient Active Problem List   Diagnosis Date Noted  . Palliative care by specialist   . GI bleed 07/15/2017  . Supratherapeutic INR 07/15/2017  . Acute UTI 07/15/2017  . AKI (acute kidney injury) ([place])   . Cough   . Dehydration   . Warfarin-induced coagulopathy ([place])   . Community acquired pneumonia 07/14/2017  . Chronic diarrhea 05/06/2017  . Dementia 05/06/2017  . Long term (current) use of anticoagulants 12/07/2016  . Goals of care, counseling/discussion 06/01/2015  . Gout of  right wrist 04/12/2015  . Chronic diastolic CHF (congestive heart failure), NYHA class 1 ([place]) 03/12/2015  . Tremor of both hands 01/14/2014  . BRBPR (bright red blood per rectum) 01/14/2014  . Asthma with acute exacerbation 12/10/2012  . Osteopenia 08/16/2011  . General medical examination 08/16/2011  . Bronchitis 03/08/2011  . Hyperlipidemia 02/21/2011  . Anemia 12/29/2010  . Abnormal LFTs 12/29/2010  . Current use of long term anticoagulation 12/29/2010  . Bradycardia 09/09/2010  . Atrial fibrillation ([place]) 04/26/2010  . History of diet-controlled diabetes 11/09/2009  . CAROTID BRUIT, LEFT 11/09/2009  . INTRINSIC ASTHMA, UNSPECIFIED 09/27/2009  . DYSPNEA ON EXERTION 08/19/2009  . KNEE PAIN 07/20/2009  . EDEMA 07/20/2009  . OBESITY 12/07/2008  . Essential hypertension 03/24/2008  . GERD 03/24/2008    Palliative Care Assessment & Plan   Patient Profile: 84 y.o. female with past medical history of dementia, atrial fibrillation on coumadin, CHF, CKD, DM type 2, GERD, obesity admitted on 07/14/2017 with fever, weakness, and dark stools. Patient admitted for community acquired pneumonia, UTI, GI bleeding with subtherapeutic INR, and possible diastolic CHF exacerbation. Patient treated with 5 day course of rocephin and zithromax (discontinued on 5/23). ECHO showed EF of 55-60% with grade 2 diastolic dysfunction. Receiving IV lasix. Palliative medicine consultation for goals of care.     Assessment: Patient Active Problem List   Diagnosis Date Noted  . Palliative care by specialist   . GI bleed 07/15/2017  . Supratherapeutic INR 07/15/2017  . Acute UTI 07/15/2017  . AKI (acute kidney injury) ([place])   . Cough   . Dehydration   . Warfarin-induced coagulopathy ([place])   . Community acquired pneumonia 07/14/2017  . Chronic diarrhea 05/06/2017  . Dementia 05/06/2017  . Long term (current) use of anticoagulants 12/07/2016  . Goals of care, counseling/discussion 06/01/2015  . Gout of  right wrist 04/12/2015  . Chronic diastolic CHF (congestive heart failure), NYHA class 1 ([place]) 03/12/2015  .  Tremor of both hands 01/14/2014  . BRBPR (bright red blood per rectum) 01/14/2014  . Asthma with acute exacerbation 12/10/2012  . Osteopenia 08/16/2011  . General medical examination 08/16/2011  . Bronchitis 03/08/2011  . Hyperlipidemia 02/21/2011  . Anemia 12/29/2010  . Abnormal LFTs 12/29/2010  . Current use of long term anticoagulation 12/29/2010  . Bradycardia 09/09/2010  . Atrial fibrillation ([place]) 04/26/2010  . History of diet-controlled diabetes 11/09/2009  . CAROTID BRUIT, LEFT 11/09/2009  . INTRINSIC ASTHMA, UNSPECIFIED 09/27/2009  . DYSPNEA ON EXERTION 08/19/2009  . KNEE PAIN 07/20/2009  . EDEMA 07/20/2009  . OBESITY 12/07/2008  . Essential hypertension 03/24/2008  . GERD 03/24/2008     Recommendations/Plan:   Daughter's concern is maintaining comfort and dignity in safe care environment.  At this point, she reports that she is concerned about her ability to care for her mother at home.  She states that there are not resources for private pay for long term custodial care with hospice support.  She would like to discuss hospice options with [facility] as her mother is currently seen by [facility].  Discussed with SW and appreciate SW assistance.    Goals of Care and Additional Recommendations:  Limitations on Scope of Treatment: Full Comfort Care  Code Status:    Code Status Orders  (From admission, onward)        Start     Ordered   07/15/17 1059  Do not attempt resuscitation (DNR)  Continuous    Question Answer Comment  In the event of cardiac or respiratory ARREST Do not call a "code blue"   In the event of cardiac or respiratory ARREST Do not perform Intubation, CPR, defibrillation or ACLS   In the event of cardiac or respiratory ARREST Use medication by any route, position, wound care, and other measures to relive pain and  suffering. May use oxygen, suction and manual treatment of airway obstruction as needed for comfort.      07/15/17 1058    Code Status History    Date Active Date Inactive Code Status Order ID Comments User Context   07/14/2017 2208 07/15/2017 1058 Full Code [id number]  Harvie Bridge, DO ED   03/12/2015 2000 03/16/2015 0149 Full Code [id number]  Toy Baker, MD Inpatient    Advance Directive Documentation     Most Recent Value  Type of Advance Directive  Healthcare Power of Attorney, Living will  Pre-existing out of facility DNR order (yellow form or pink MOST form)  -  "MOST" Form in Place?  -       Prognosis:   < 6 months  Discharge Planning:  To Be Determined  Care plan was discussed with daughter, Dr. Starla Link, RN, daughter  Thank you for allowing the Palliative Medicine Team to assist in the care of this patient.   Total Time 15 Prolonged Time Billed No      Greater than 50%  of this time was spent counseling and coordinating care related to the above assessment and plan.  Micheline Rough, MD  Please contact Palliative Medicine Team phone at [phone] for questions and concerns.

## 2017-07-21 NOTE — Consult Note (Signed)
[facility]: Current [facility]. Meeting with family this afternoon to discuss full hospice services upon discharge. Ronette Deter, RN [facility] [phone]

## 2017-07-21 NOTE — Consult Note (Signed)
[facility]: Met with daughter to discuss hospice services and goals of care. Services reviewed and daughter would like her to go home with hospice but unsure if she can provide her care. Discussed criteria for Inpatient vs homecare services and it was decided that we could evaluate her again tomorrow and transfer to hospice home inpatient if appropriate. She is eating bites and drinking sips with likely short prognosis. Thank you for the opportunity to serve this family. Liaison will follow up tomorrow with plans for potential transfer to hospice home. Please call with further questions or concerns. Doroteo Glassman, RN [phone]

## 2017-07-22 LAB — GLUCOSE, CAPILLARY
GLUCOSE-CAPILLARY: 151 mg/dL — AB (ref 65–99)
Glucose-Capillary: 102 mg/dL — ABNORMAL HIGH (ref 65–99)
Glucose-Capillary: 125 mg/dL — ABNORMAL HIGH (ref 65–99)
Glucose-Capillary: 98 mg/dL (ref 65–99)

## 2017-07-22 MED ORDER — ALPRAZOLAM 0.5 MG PO TABS: 0.5000 mg | ORAL_TABLET | Freq: Three times a day (TID) | ORAL | 0 refills | Status: AC | PRN

## 2017-07-22 MED ORDER — OXYCODONE HCL 5 MG PO TABS: 5.0000 mg | ORAL_TABLET | ORAL | 0 refills | Status: AC | PRN

## 2017-07-22 MED ORDER — ONDANSETRON HCL 4 MG PO TABS: 4.0000 mg | ORAL_TABLET | Freq: Four times a day (QID) | ORAL | 0 refills | Status: AC | PRN

## 2017-07-22 MED ORDER — FUROSEMIDE 40 MG PO TABS: 40.0000 mg | ORAL_TABLET | Freq: Two times a day (BID) | ORAL | 0 refills | Status: AC

## 2017-07-22 MED ORDER — SIMVASTATIN 40 MG PO TABS: ORAL_TABLET | ORAL | Status: AC

## 2017-07-22 MED ORDER — TRAZODONE HCL 50 MG PO TABS: 50.0000 mg | ORAL_TABLET | Freq: Every evening | ORAL | 0 refills | Status: AC | PRN

## 2017-07-22 MED ORDER — PANTOPRAZOLE SODIUM 40 MG PO TBEC: 40.0000 mg | DELAYED_RELEASE_TABLET | Freq: Every day | ORAL | 0 refills | Status: AC

## 2017-07-22 NOTE — Progress Notes (Signed)
Daily Progress Note   Patient Name: [name]       Date: 07/22/2017 DOB: [birth year]  Age: 84 y.o. MRN#: [id number] Attending Physician: Aline August, MD Primary Care Physician: Midge Minium, MD Admit Date: 07/14/2017  Reason for Consultation/Follow-up: Establishing goals of care  Subjective: I met today with patient's daughter.  She met yesterday with [facility].  Plan to meet again today to discuss disposition.  Yesterday, plan was to see if she continued to progress and may qualify for residential hospice.  Questions and concerns addressed.   PMT will continue to support holistically.  See below.  Length of Stay: 8  Current Medications: Scheduled Meds:  . allopurinol  100 mg Oral Daily  . amLODipine  10 mg Oral QHS  . atorvastatin  20 mg Oral QHS  . dextromethorphan-guaiFENesin  1 tablet Oral BID  . feeding supplement  1 Container Oral TID BM  . fluticasone  2 spray Each Nare Daily  . furosemide  40 mg Oral BID  . hydrALAZINE  100 mg Oral TID  . insulin aspart  0-15 Units Subcutaneous Q4H  . levothyroxine  50 mcg Oral Daily  . loratadine  10 mg Oral Daily  . multivitamin with minerals   Oral Daily  . pantoprazole  40 mg Oral BID AC    Continuous Infusions:   PRN Meds: acetaminophen **OR** acetaminophen, ALPRAZolam, guaiFENesin, ipratropium-albuterol, morphine injection, ondansetron **OR** ondansetron (ZOFRAN) IV, oxyCODONE, traZODone  Physical Exam     Constitutional: She is easily aroused. She appears ill.  HENT:  Head: Normocephalic and atraumatic.  Pulmonary/Chest: No accessory muscle usage. No tachypnea. No respiratory distress.  Neurological: She is easily aroused.  Wakes to voice, drowsy, pleasantly confused.  Skin: Skin is warm and  dry.  Psychiatric: Her speech is delayed. She is inattentive.  Nursing note and vitals reviewed.  Vital Signs: BP (!) 117/56 (BP Location: Left Arm)   Pulse (!) 43   Temp 98.4 F (36.9 C)   Resp 15   Ht '4\' 11"'$  (1.499 m)   Wt 74.8 kg (165 lb)   SpO2 98%   BMI 33.33 kg/m  SpO2: SpO2: 98 % O2 Device: O2 Device: Nasal Cannula O2 Flow Rate: O2 Flow Rate (L/min): 3 L/min  Intake/output summary:   Intake/Output Summary (Last 24  hours) at 07/22/2017 1158 Last data filed at 07/22/2017 0804 Gross per 24 hour  Intake 540 ml  Output 100 ml  Net 440 ml   LBM: Last BM Date: 07/21/17 Baseline Weight: Weight: 74.8 kg (165 lb) Most recent weight: Weight: 74.8 kg (165 lb)       Palliative Assessment/Data:    Flowsheet Rows     Most Recent Value  Intake Tab  Referral Department  Hospitalist  Unit at Time of Referral  Med/Surg Unit  Palliative Care Primary Diagnosis  -- [dementia, PNA, UTI, diastolic CHF]  Palliative Care Type  New Palliative care  Reason for referral  Clarify Goals of Care  Date first seen by Palliative Care  07/18/17  Clinical Assessment  Palliative Performance Scale Score  40%  Psychosocial & Spiritual Assessment  Palliative Care Outcomes  Patient/Family meeting held?  Yes  Who was at the meeting?  daughter  Palliative Care Outcomes  Clarified goals of care, Counseled regarding hospice, Provided psychosocial or spiritual support, ACP counseling assistance, Provided end of life care assistance, Linked to palliative care logitudinal support, Provided advance care planning      Patient Active Problem List   Diagnosis Date Noted  . Palliative care by specialist   . GI bleed 07/15/2017  . Supratherapeutic INR 07/15/2017  . Acute UTI 07/15/2017  . AKI (acute kidney injury) ([place])   . Cough   . Dehydration   . Warfarin-induced coagulopathy ([place])   . Community acquired pneumonia 07/14/2017  . Chronic diarrhea 05/06/2017  . Dementia 05/06/2017  . Long term  (current) use of anticoagulants 12/07/2016  . Goals of care, counseling/discussion 06/01/2015  . Gout of right wrist 04/12/2015  . Chronic diastolic CHF (congestive heart failure), NYHA class 1 ([place]) 03/12/2015  . Tremor of both hands 01/14/2014  . BRBPR (bright red blood per rectum) 01/14/2014  . Asthma with acute exacerbation 12/10/2012  . Osteopenia 08/16/2011  . General medical examination 08/16/2011  . Bronchitis 03/08/2011  . Hyperlipidemia 02/21/2011  . Anemia 12/29/2010  . Abnormal LFTs 12/29/2010  . Current use of long term anticoagulation 12/29/2010  . Bradycardia 09/09/2010  . Atrial fibrillation ([place]) 04/26/2010  . History of diet-controlled diabetes 11/09/2009  . CAROTID BRUIT, LEFT 11/09/2009  . INTRINSIC ASTHMA, UNSPECIFIED 09/27/2009  . DYSPNEA ON EXERTION 08/19/2009  . KNEE PAIN 07/20/2009  . EDEMA 07/20/2009  . OBESITY 12/07/2008  . Essential hypertension 03/24/2008  . GERD 03/24/2008    Palliative Care Assessment & Plan   Patient Profile: 84 y.o. female with past medical history of dementia, atrial fibrillation on coumadin, CHF, CKD, DM type 2, GERD, obesity admitted on 07/14/2017 with fever, weakness, and dark stools. Patient admitted for community acquired pneumonia, UTI, GI bleeding with subtherapeutic INR, and possible diastolic CHF exacerbation. Patient treated with 5 day course of rocephin and zithromax (discontinued on 5/23). ECHO showed EF of 55-60% with grade 2 diastolic dysfunction. Receiving IV lasix. Palliative medicine consultation for goals of care.     Assessment: Patient Active Problem List   Diagnosis Date Noted  . Palliative care by specialist   . GI bleed 07/15/2017  . Supratherapeutic INR 07/15/2017  . Acute UTI 07/15/2017  . AKI (acute kidney injury) ([place])   . Cough   . Dehydration   . Warfarin-induced coagulopathy ([place])   . Community acquired pneumonia 07/14/2017  . Chronic diarrhea 05/06/2017  . Dementia 05/06/2017  . Long term  (current) use of anticoagulants 12/07/2016  . Goals of care, counseling/discussion 06/01/2015  .  Gout of right wrist 04/12/2015  . Chronic diastolic CHF (congestive heart failure), NYHA class 1 ([place]) 03/12/2015  . Tremor of both hands 01/14/2014  . BRBPR (bright red blood per rectum) 01/14/2014  . Asthma with acute exacerbation 12/10/2012  . Osteopenia 08/16/2011  . General medical examination 08/16/2011  . Bronchitis 03/08/2011  . Hyperlipidemia 02/21/2011  . Anemia 12/29/2010  . Abnormal LFTs 12/29/2010  . Current use of long term anticoagulation 12/29/2010  . Bradycardia 09/09/2010  . Atrial fibrillation ([place]) 04/26/2010  . History of diet-controlled diabetes 11/09/2009  . CAROTID BRUIT, LEFT 11/09/2009  . INTRINSIC ASTHMA, UNSPECIFIED 09/27/2009  . DYSPNEA ON EXERTION 08/19/2009  . KNEE PAIN 07/20/2009  . EDEMA 07/20/2009  . OBESITY 12/07/2008  . Essential hypertension 03/24/2008  . GERD 03/24/2008     Recommendations/Plan:   Daughter's concern is maintaining comfort and dignity in safe care environment.  She met yesterday with [facility] and plan for follow-up again today to discuss likely home with hospice versus residential hospice facility.  At this point, I think that home with hospice is probably more likely option but she would likely want to utilize residential facility at some point in the future.      Goals of Care and Additional Recommendations:  Limitations on Scope of Treatment: Full Comfort Care  Code Status:    Code Status Orders  (From admission, onward)        Start     Ordered   07/15/17 1059  Do not attempt resuscitation (DNR)  Continuous    Question Answer Comment  In the event of cardiac or respiratory ARREST Do not call a "code blue"   In the event of cardiac or respiratory ARREST Do not perform Intubation, CPR, defibrillation or ACLS   In the event of cardiac or respiratory ARREST Use medication by any route, position, wound  care, and other measures to relive pain and suffering. May use oxygen, suction and manual treatment of airway obstruction as needed for comfort.      07/15/17 1058    Code Status History    Date Active Date Inactive Code Status Order ID Comments User Context   07/14/2017 2208 07/15/2017 1058 Full Code [id number]  Harvie Bridge, DO ED   03/12/2015 2000 03/16/2015 0149 Full Code [id number]  Toy Baker, MD Inpatient    Advance Directive Documentation     Most Recent Value  Type of Advance Directive  Healthcare Power of Attorney, Living will  Pre-existing out of facility DNR order (yellow form or pink MOST form)  -  "MOST" Form in Place?  -       Prognosis:   < 6 months  Discharge Planning:  To Be Determined  Care plan was discussed with daughter, Dr. Starla Link, RN, daughter  Thank you for allowing the Palliative Medicine Team to assist in the care of this patient.   Total Time 15 Prolonged Time Billed No      Greater than 50%  of this time was spent counseling and coordinating care related to the above assessment and plan.  Micheline Rough, MD  Please contact Palliative Medicine Team phone at [phone] for questions and concerns.

## 2017-07-22 NOTE — Progress Notes (Signed)
Discharge instructions reviewed with daughter, who is the Geographical information systems officer. All questions answered. Patient transported by [facility] and belongings given to daughter.

## 2017-07-22 NOTE — Progress Notes (Signed)
CSW met with patient daughter briefly to provide support and discuss patient transition home w/ hospice. Patient daughter "feels this is best" for the patient at this time.  No other SNF needs identified.   Kathrin Greathouse, Latanya Presser, MSW Clinical Social Worker  [phone] 07/22/2017  11:48 AM

## 2017-07-22 NOTE — Consult Note (Signed)
[facility]: Met with daughter and SIL at bedside. Pt not appropriate for hospice home residential level of care. She will go home with pt's daughter. This is were she lived prior to hospitalization. I have ordered equipment of hospital bed, OBT, BSC, oxygen set up with nebulizer and oral suction. She already owns a walker and transport chair.  [facility] will deliver today. Pt will need to go home by ambulance. CM Suanne Marker will set this up. I will let her know what time equipment will be delivered so that we can coordinate this. MD will discharge pt. SNR MOST form and face sheet on pt's chart to go home with ambulance. RN will place AVS in the packet and Suanne Marker will place ambulance transfer form in packets after being arranged. Please call with any questions: Webb Silversmith RN [phone]

## 2017-07-22 NOTE — Discharge Summary (Signed)
Physician Discharge Summary  [name] ZOX:[id number] DOB: 11-27-1929 DOA: 07/14/2017  PCP: Sheliah Hatch, MD  Admit date: 07/14/2017 Discharge date: 07/22/2017  Admitted From:Home Disposition:  Home  Recommendations for Outpatient Follow-up:  1. Follow up with PCP in 1week 2. Outpatient follow-up with hospice  Home Health: Yes Equipment/Devices: Oxygen as needed  Discharge Condition: Poor CODE STATUS: DNR Diet recommendation: Heart Healthy /comfort measures diet  Brief/Interim Summary: 84 year old female with history of atrial fibrillation on Coumadin, CKD 3, diabetes mellitus type 2, hypertension, GERD, hyperlipidemia, question of CHF presented on 07/14/2017 with for evaluation of fever along with dark stools.  Patient was admitted for multiple medical problems including community acquired pneumonia, UTI, GI bleed with supratherapeutic INR along with possibly diastolic CHF exacerbation.  Patient was treated with intravenous antibiotics and completed course, urine culture grew Klebsiella.  Patient was empirically started on intravenous Protonix which was switched to oral Protonix.  Family preferred no endoscopic intervention.  Palliative care was consulted for overall poor prognosis.  Patient was also put on intravenous Lasix which was transitioned to oral Lasix.  Family has decided to pursue hospice/comfort measures.  Lab works and imaging studies have been discontinued.  Patient was evaluated by hospice as well.  She will be discharged home with home health.    Discharge Diagnoses:  Active Problems:   Essential hypertension   GERD   Atrial fibrillation (HCC)   Anemia   Hyperlipidemia   Chronic diastolic CHF (congestive heart failure), NYHA class 1 (HCC)   Goals of care, counseling/discussion   Chronic diarrhea   Dementia   Community acquired pneumonia   GI bleed   Supratherapeutic INR   Acute UTI   AKI (acute kidney injury) (HCC)   Cough   Dehydration    Warfarin-induced coagulopathy (HCC)   Palliative care by specialist  Community-acquired pneumonia -Blood cultures negative so far.  Treated with 5-day course of Rocephin and Zithromax which were discontinued on 07/18/2017 -Currently on 2 L nasal cannula oxygen.    Patient will need home oxygen on discharge.  UTI with Klebsiella oxytoca -Completed antibiotic course  Acute on chronic normocytic anemia with acute blood loss anemia -Family has opted for comfort measures.  No further lab works.  Probable upper GI bleeding presenting with dark stools -Continue oral Protonix.  prior hospitalist discussed with patient's daughter regarding endoscopy or colonoscopy.  Patient's daughter did not wish to have any procedures -Coumadin has been discontinued.  Supratherapeutic INR -No further lab works.  Coumadin discontinued  Probable acute on chronic diastolic CHF -Echo showed EF of 55 to 60% with grade 2 diastolic dysfunction -Lasix has been switched to oral for hospice/comfort measures  Chronic kidney disease stage IV -No further lab work  Essential hypertension -Monitor blood pressure.  Continue amlodipine and hydralazine  Hyperlipidemia -Continue statin  Hypothyroidism -Continue Synthroid  Diabetes mellitus type 2 -Diet controlled.   dementia -Stable.   Chronic atrial fibrillation -Currently bradycardic.  Coumadin discontinued  Hypokalemia -No further lab work   Deconditioning -Overall prognosis is poor.   -Family has decided to pursue comfort measures/hospice.  Hospice has evaluated the patient.  For now patient will be discharged home with home health.  Outpatient follow-up with hospice   Discharge Instructions  Discharge Instructions    Diet - low sodium heart healthy   Complete by:  As directed    Increase activity slowly   Complete by:  As directed      Allergies as of 07/22/2017  No Known Allergies     Medication List    STOP taking these  medications   colchicine 0.6 MG tablet   warfarin 3 MG tablet Commonly known as:  COUMADIN     TAKE these medications   acidophilus Caps capsule Take 1 capsule by mouth daily.   albuterol 108 (90 Base) MCG/ACT inhaler Commonly known as:  VENTOLIN HFA INHALE TWO PUFFS EVERY 4 HOURS AS NEEDED FOR COUGH AND  WHEEZING   allopurinol 100 MG tablet Commonly known as:  ZYLOPRIM TAKE 1 TABLET BY MOUTH ONCE DAILY   ALPRAZolam 0.5 MG tablet Commonly known as:  XANAX Take 1 tablet (0.5 mg total) by mouth 3 (three) times daily as needed for anxiety.   amLODipine 10 MG tablet Commonly known as:  NORVASC Take 10 mg by mouth at bedtime.   cetirizine 10 MG tablet Commonly known as:  ZYRTEC Take 10 mg by mouth daily.   fluticasone 50 MCG/ACT nasal spray Commonly known as:  FLONASE Place 2 sprays into both nostrils daily.   furosemide 40 MG tablet Commonly known as:  LASIX Take 1 tablet (40 mg total) by mouth 2 (two) times daily. What changed:  when to take this   guaiFENesin 100 MG/5ML Soln Commonly known as:  ROBITUSSIN Take 5-10 mLs by mouth every 4 (four) hours as needed for cough or to loosen phlegm.   hydrALAZINE 100 MG tablet Commonly known as:  APRESOLINE Take 1 tablet (100 mg total) by mouth 3 (three) times daily.   levothyroxine 50 MCG tablet Commonly known as:  SYNTHROID, LEVOTHROID TAKE 1 TABLET BY MOUTH ONCE DAILY   MULTIVITAMIN ADULTS 50+ PO Take 1 tablet by mouth daily.   ondansetron 4 MG tablet Commonly known as:  ZOFRAN Take 1 tablet (4 mg total) by mouth every 6 (six) hours as needed for nausea.   oxyCODONE 5 MG immediate release tablet Commonly known as:  Oxy IR/ROXICODONE Take 1 tablet (5 mg total) by mouth every 4 (four) hours as needed for moderate pain.   pantoprazole 40 MG tablet Commonly known as:  PROTONIX Take 1 tablet (40 mg total) by mouth daily.   simvastatin 40 MG tablet Commonly known as:  ZOCOR TAKE HALF TABLET BY MOUTH AT  BEDTIME What changed:    how much to take  how to take this  when to take this  additional instructions   traZODone 50 MG tablet Commonly known as:  DESYREL Take 1 tablet (50 mg total) by mouth at bedtime as needed for sleep.       Contact information for follow-up providers    Sheliah Hatch, MD. Schedule an appointment as soon as possible for a visit in 1 week(s).   Specialty:  Family Medicine Contact information: [address] [place] [address] [phone]            Contact information for after-discharge care    Destination    [facility] .   Service:  Skilled Nursing Contact information: [address] [place] [address] [phone]                 No Known Allergies  Consultations: Palliative care  Procedures/Studies: Ct Abdomen Pelvis Wo Contrast  Result Date: 07/14/2017 CLINICAL DATA:  Cough, congestion and fatigue for 6 days. History of hysterectomy. EXAM: CT ABDOMEN AND PELVIS WITHOUT CONTRAST TECHNIQUE: Multidetector CT imaging of the abdomen and pelvis was performed following the standard protocol without IV contrast. COMPARISON:  None. FINDINGS: LOWER CHEST: Small bilateral pleural effusions. Included heart is enlarged. Trace pericardial effusion. HEPATOBILIARY: Distended gallbladder with 2.6 cm gallstone. Negative noncontrast CT liver. PANCREAS: Normal. SPLEEN: Normal. ADRENALS/URINARY TRACT: Kidneys are orthotopic, demonstrating normal size and morphology. No nephrolithiasis, hydronephrosis; limited assessment for renal masses on this nonenhanced examination. RIGHT pelviectasis. The unopacified ureters are normal in course and caliber. Urinary bladder is partially distended and unremarkable. Normal adrenal glands. STOMACH/BOWEL: The stomach, small and large bowel are normal in course and caliber. Severe descending and sigmoid colonic diverticulosis. Ascending and transverse colon air contrast  levels. Normal appendix. VASCULAR/LYMPHATIC: Aortoiliac vessels are normal in course and caliber. Moderate to severe calcific atherosclerosis. No lymphadenopathy by CT size criteria. REPRODUCTIVE: Status post hysterectomy. OTHER: No intraperitoneal free fluid or free air. MUSCULOSKELETAL: Non-acute. Status post anterior abdominal wall herniorrhaphy. Moderate sacroiliac osteoarthrosis. Severe lower lumbar facet arthropathy. Grade 1 L4-5 anterolisthesis without spondylolysis. Moderate to severe L4-5 neural foraminal narrowing. IMPRESSION: 1. Cholelithiasis and gallbladder distension without additional findings of acute cholecystitis. 2. Colonic air contrast levels, possible enteritis. Colonic diverticulosis. 3. Cardiomegaly and small pleural effusions. Aortic Atherosclerosis (ICD10-I70.0). Electronically Signed   By: Awilda Metro M.D.   On: 07/14/2017 17:39   Dg Chest 2 View  Result Date: 07/18/2017 CLINICAL DATA:  84 year old with dyspnea. EXAM: CHEST - 2 VIEW COMPARISON:  07/14/2017 and 08/19/2009 FINDINGS: Basilar densities are compatible with small bilateral pleural effusions. The effusions appear larger compared to the recent comparison examination. In addition, there is fluid or thickening in the lung fissures. Prominent interstitial lung densities, right side on greater than left. Heart size remains mildly enlarged. Atherosclerotic calcifications at the aortic arch. IMPRESSION: Enlarging small bilateral pleural effusions. Cardiomegaly with asymmetric vascular congestion or mild pulmonary edema. Difficult to exclude atypical infection in the right lung. Electronically Signed   By: Richarda Overlie M.D.   On: 07/18/2017 10:25   Dg Chest 2 View  Result Date: 07/14/2017 CLINICAL DATA:  Cough and congestion with fatigue 6 days. EXAM: CHEST - 2 VIEW COMPARISON:  08/19/2009 FINDINGS: Lungs are adequately inflated with mild prominence of the perihilar markings with middle patchy density in the left perihilar  region and left base. Small left pleural effusion. Moderate cardiomegaly. Degenerative change of the spine. IMPRESSION: Findings suggesting mild CHF. Possible infection over the left mid to lower lung. Electronically Signed   By: Elberta Fortis M.D.   On: 07/14/2017 15:18    Echo - Left ventricle: The cavity size was normal. Systolic function was normal. The estimated ejection fraction was in the range of 55% to 60%. Wall motion was normal; there were no regional wall motion abnormalities. Features are consistent with a pseudonormal left ventricular filling pattern, with concomitant abnormal relaxation and increased filling pressure (grade 2 diastolic dysfunction). - Mitral valve: There was mild regurgitation. - Left atrium: The atrium was severely dilated. - Right atrium: The atrium was severely dilated. - Pulmonary arteries: Systolic pressure was moderately to severely increased. PA peak pressure: 66 mm Hg (S). - Pericardium, extracardiac: A trivial pericardial effusion was identified posterior to the heart.     Subjective: Patient seen and examined at bedside.  She is awake and a poor historian but denies any overnight fever or pain.  Discharge Exam: Vitals:   07/22/17 0658 07/22/17 0819  BP:  (!) 117/56  Pulse:  (!) 43  Resp:    Temp: 98.8 F (37.1 C) 98.4 F (36.9 C)  SpO2:     Vitals:   07/21/17 1949  07/22/17 0506 07/22/17 0658 07/22/17 0819  BP: (!) 142/39 (!) 118/33  (!) 117/56  Pulse: (!) 49 (!) 43  (!) 43  Resp: 15 15    Temp: 98.2 F (36.8 C) (!) 101.2 F (38.4 C) 98.8 F (37.1 C) 98.4 F (36.9 C)  TempSrc: Oral Oral Oral   SpO2:      Weight:      Height:        General: Pt is awake but a poor historian  cardiovascular: Bradycardic, S1/S2 + Respiratory: Bilateral decreased breath sounds at bases Abdominal: Soft, NT, ND, bowel sounds + Extremities: Trace edema, no cyanosis    The results of significant diagnostics from this  hospitalization (including imaging, microbiology, ancillary and laboratory) are listed below for reference.     Microbiology: Recent Results (from the past 240 hour(s))  Blood Culture (routine x 2)     Status: None   Collection Time: 07/14/17  3:23 PM  Result Value Ref Range Status   Specimen Description   Final    BLOOD LEFT FOREARM Performed at [facility], [address], [place], [place] [address]    Special Requests   Final    BOTTLES DRAWN AEROBIC AND ANAEROBIC Blood Culture results may not be optimal due to an excessive volume of blood received in culture bottles Performed at [facility], [address] [place], [place] [address]    Culture   Final    NO GROWTH 5 DAYS Performed at [facility], [address], [place], [place] [address]    Report Status 07/19/2017 FINAL  Final  Blood Culture (routine x 2)     Status: None   Collection Time: 07/14/17  4:38 PM  Result Value Ref Range Status   Specimen Description   Final    BLOOD RIGHT ANTECUBITAL Performed at [facility], [address] [place], [place] [address]    Special Requests   Final    BOTTLES DRAWN AEROBIC AND ANAEROBIC Blood Culture results may not be optimal due to an excessive volume of blood received in culture bottles Performed at [facility], [address], [place], [place] [address]    Culture   Final    NO GROWTH 5 DAYS Performed at [facility], [address] [place], [place] [address]    Report Status 07/19/2017 FINAL  Final  Urine Culture     Status: Abnormal   Collection Time: 07/14/17  6:20 PM  Result Value Ref Range Status   Specimen Description   Final    URINE, RANDOM Performed at [facility], [address], [place], [place] [address]    Special Requests   Final    NONE Performed at [facility], [address], [place], [place] [address]    Culture >=100,000  COLONIES/mL KLEBSIELLA OXYTOCA (A)  Final   Report Status 07/17/2017 FINAL  Final   Organism ID, Bacteria KLEBSIELLA OXYTOCA (A)  Final      Susceptibility   Klebsiella oxytoca - MIC*    AMPICILLIN >=32 RESISTANT Resistant     CEFAZOLIN 8 SENSITIVE Sensitive     CEFTRIAXONE <=1 SENSITIVE Sensitive     CIPROFLOXACIN <=0.25 SENSITIVE Sensitive     GENTAMICIN <=1 SENSITIVE Sensitive     IMIPENEM <=0.25 SENSITIVE Sensitive     NITROFURANTOIN <=16 SENSITIVE Sensitive     TRIMETH/SULFA <=20 SENSITIVE Sensitive     AMPICILLIN/SULBACTAM 8 SENSITIVE Sensitive  PIP/TAZO <=4 SENSITIVE Sensitive     Extended ESBL NEGATIVE Sensitive     * >=100,000 COLONIES/mL KLEBSIELLA OXYTOCA     Labs: BNP (last 3 results) Recent Labs    07/15/17 0632  BNP 301.1*   Basic Metabolic Panel: Recent Labs  Lab 07/16/17 0416 07/17/17 0414 07/18/17 0505 07/19/17 0416 07/20/17 0409  NA 143 140 140 142 141  K 3.7 4.0 3.3* 3.6 3.2*  CL 110 109 108 109 107  CO2 21* 21* GLUCOSE 94 95 108* 101* 105*  BUN 67* 59* 50* 43* 42*  CREATININE 1.73* 1.71* 1.62* 1.57* 1.47*  CALCIUM 8.4* 8.0* 8.1* 7.9* 8.2*  MG  --   --  2.1 1.9 1.7   Liver Function Tests: No results for input(s): AST, ALT, ALKPHOS, BILITOT, PROT, ALBUMIN in the last 168 hours. No results for input(s): LIPASE, AMYLASE in the last 168 hours. No results for input(s): AMMONIA in the last 168 hours. CBC: Recent Labs  Lab 07/16/17 0416 07/17/17 0414 07/18/17 0505 07/19/17 0416 07/20/17 0409  WBC 8.5 7.4 7.0 7.8 9.0  NEUTROABS  --   --  5.3 6.2 7.7  HGB 8.0* 8.4* 7.9* 8.0* 8.5*  HCT 25.2* 25.7* 24.5* 24.4* 26.7*  MCV 92.6 90.8 91.4 91.0 92.7  PLT 196 199 180 192 182   Cardiac Enzymes: No results for input(s): CKTOTAL, CKMB, CKMBINDEX, TROPONINI in the last 168 hours. BNP: Invalid input(s): POCBNP CBG: Recent Labs  Lab 07/21/17 1538 07/21/17 2017 07/22/17 0017 07/22/17 0358 07/22/17 0734  GLUCAP 108* 248* 98 125* 102*    D-Dimer No results for input(s): DDIMER in the last 72 hours. Hgb A1c No results for input(s): HGBA1C in the last 72 hours. Lipid Profile No results for input(s): CHOL, HDL, LDLCALC, TRIG, CHOLHDL, LDLDIRECT in the last 72 hours. Thyroid function studies No results for input(s): TSH, T4TOTAL, T3FREE, THYROIDAB in the last 72 hours.  Invalid input(s): FREET3 Anemia work up No results for input(s): VITAMINB12, FOLATE, FERRITIN, TIBC, IRON, RETICCTPCT in the last 72 hours. Urinalysis    Component Value Date/Time   COLORURINE YELLOW 07/14/2017 1638   APPEARANCEUR CLOUDY (A) 07/14/2017 1638   LABSPEC 1.011 07/14/2017 1638   PHURINE 5.0 07/14/2017 1638   GLUCOSEU NEGATIVE 07/14/2017 1638   HGBUR NEGATIVE 07/14/2017 1638   BILIRUBINUR NEGATIVE 07/14/2017 1638   KETONESUR NEGATIVE 07/14/2017 1638   PROTEINUR NEGATIVE 07/14/2017 1638   UROBILINOGEN 0.2 12/15/2010 1619   NITRITE POSITIVE (A) 07/14/2017 1638   LEUKOCYTESUR LARGE (A) 07/14/2017 1638   Sepsis Labs Invalid input(s): PROCALCITONIN,  WBC,  LACTICIDVEN Microbiology Recent Results (from the past 240 hour(s))  Blood Culture (routine x 2)     Status: None   Collection Time: 07/14/17  3:23 PM  Result Value Ref Range Status   Specimen Description   Final    BLOOD LEFT FOREARM Performed at [facility], [address] [place], [place] [address]    Special Requests   Final    BOTTLES DRAWN AEROBIC AND ANAEROBIC Blood Culture results may not be optimal due to an excessive volume of blood received in culture bottles Performed at [facility], [address] [place], [place] [address]    Culture   Final    NO GROWTH 5 DAYS Performed at [facility], [address] [place], [place] [address]    Report Status 07/19/2017 FINAL  Final  Blood Culture (routine x 2)     Status: None  Collection Time: 07/14/17  4:38 PM  Result Value Ref Range Status   Specimen Description   Final    BLOOD RIGHT  ANTECUBITAL Performed at [facility], [address], [place], [place] [address]    Special Requests   Final    BOTTLES DRAWN AEROBIC AND ANAEROBIC Blood Culture results may not be optimal due to an excessive volume of blood received in culture bottles Performed at [facility], [address] [place], [place] [address]    Culture   Final    NO GROWTH 5 DAYS Performed at [facility], [address], [place], [place] [address]    Report Status 07/19/2017 FINAL  Final  Urine Culture     Status: Abnormal   Collection Time: 07/14/17  6:20 PM  Result Value Ref Range Status   Specimen Description   Final    URINE, RANDOM Performed at [facility], [address] [place], [place] [address]    Special Requests   Final    NONE Performed at [facility], [address] [place], [place] [address]    Culture >=100,000 COLONIES/mL KLEBSIELLA OXYTOCA (A)  Final   Report Status 07/17/2017 FINAL  Final   Organism ID, Bacteria KLEBSIELLA OXYTOCA (A)  Final      Susceptibility   Klebsiella oxytoca - MIC*    AMPICILLIN >=32 RESISTANT Resistant     CEFAZOLIN 8 SENSITIVE Sensitive     CEFTRIAXONE <=1 SENSITIVE Sensitive     CIPROFLOXACIN <=0.25 SENSITIVE Sensitive     GENTAMICIN <=1 SENSITIVE Sensitive     IMIPENEM <=0.25 SENSITIVE Sensitive     NITROFURANTOIN <=16 SENSITIVE Sensitive     TRIMETH/SULFA <=20 SENSITIVE Sensitive     AMPICILLIN/SULBACTAM 8 SENSITIVE Sensitive     PIP/TAZO <=4 SENSITIVE Sensitive     Extended ESBL NEGATIVE Sensitive     * >=100,000 COLONIES/mL KLEBSIELLA OXYTOCA     Time coordinating discharge: 35 minutes  SIGNED:   Glade Lloyd, MD  [facility] 07/22/2017, 11:12 AM Pager: [phone]  If 7PM-7AM, please contact night-coverage [email or web address] Password [email or web address]

## 2017-07-24 ENCOUNTER — Ambulatory Visit: Payer: Self-pay | Admitting: General Practice

## 2017-07-24 ENCOUNTER — Telehealth: Payer: Self-pay | Admitting: Emergency Medicine

## 2017-07-24 NOTE — Telephone Encounter (Signed)
Spoke with Patient's Daughter Carmin Richmond, who states that Patient is being transferred to Hospice for [facility]. Daughter states that she was advised from Hospice that she only has a few days left.  Advised to call with any questions or concerns.  Kathi Simpers,  LPN

## 2017-07-24 NOTE — Telephone Encounter (Signed)
Please offer the family my support during this difficult time

## 2017-07-25 NOTE — Telephone Encounter (Signed)
Called and spoke with pt daughter. She advised that hospice has been very good to the family at this time. Pt daughter was advised to make sure and take full advantage of of hospice services, especially grief counseling services.

## 2017-07-27 ENCOUNTER — Encounter: Payer: Self-pay | Admitting: Family Medicine

## 2017-07-30 ENCOUNTER — Ambulatory Visit: Payer: Medicare HMO

## 2017-08-01 ENCOUNTER — Institutional Professional Consult (permissible substitution): Payer: Medicare HMO | Admitting: Internal Medicine

## 2017-08-26 DEATH — deceased

## 2017-10-07 ENCOUNTER — Ambulatory Visit: Payer: Medicare HMO | Admitting: Family Medicine

## 2019-04-21 IMAGING — CT CT ABD-PELV W/O CM
2 of 4 series · 16 of 46 positions shown, 18 images · non-contrast
Comparison: None.

CLINICAL DATA: Cough, congestion and fatigue for 6 days. History of
hysterectomy.

EXAM:
CT ABDOMEN AND PELVIS WITHOUT CONTRAST
TECHNIQUE: Multidetector CT imaging of the abdomen and pelvis was performed
following the standard protocol without IV contrast.

[Series 2: axial st · axial · 0.90mm/px · z∈[-416,-42]mm · 13 of 87 slices shown, 15 images]
[im 6/87  soft-tissue]
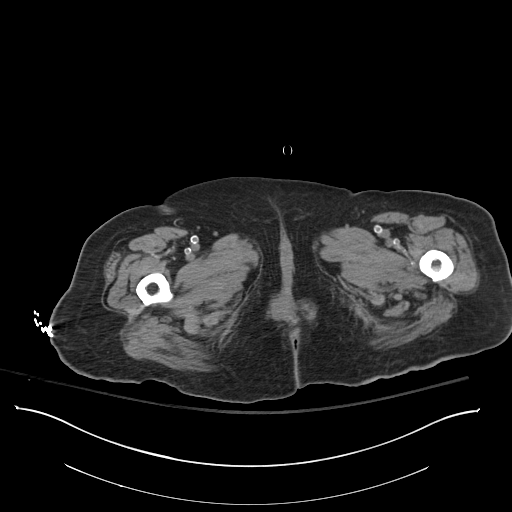
[im 6/87  bone]
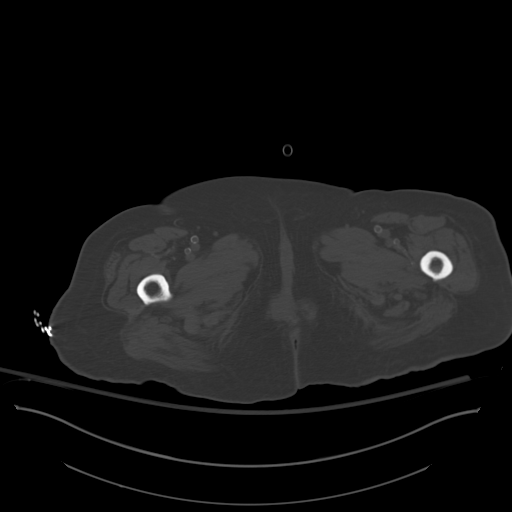
[im 11/87  soft-tissue]
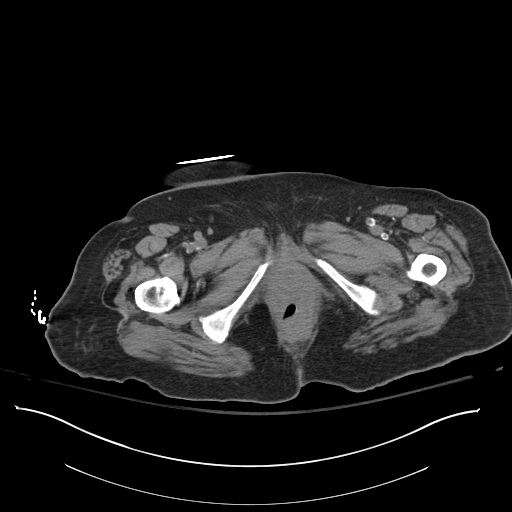
[im 21/87  soft-tissue]
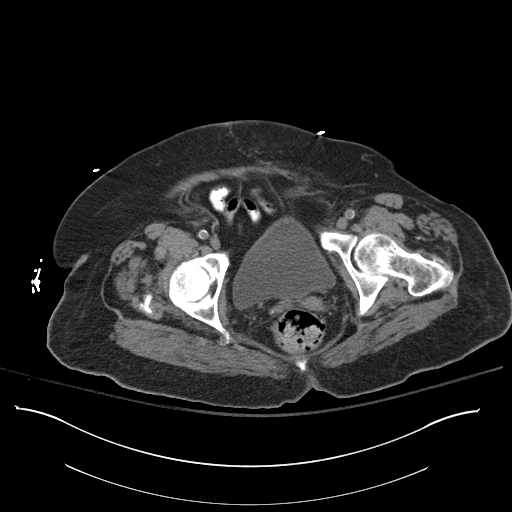
[im 26/87  soft-tissue]
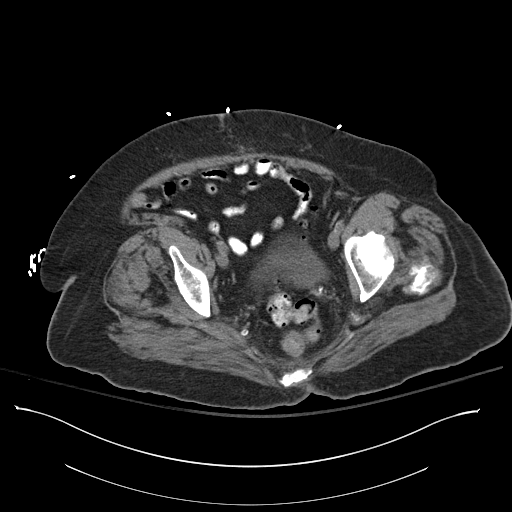
[im 31/87  soft-tissue]
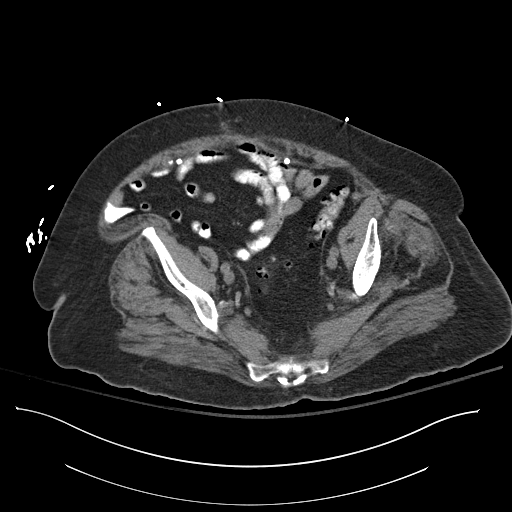
[im 36/87  soft-tissue]
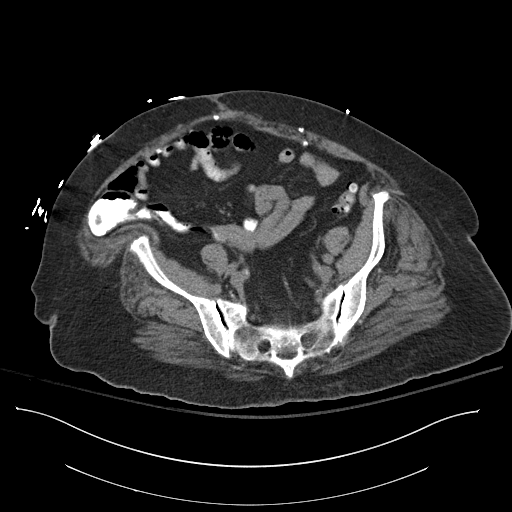
[im 46/87  soft-tissue]
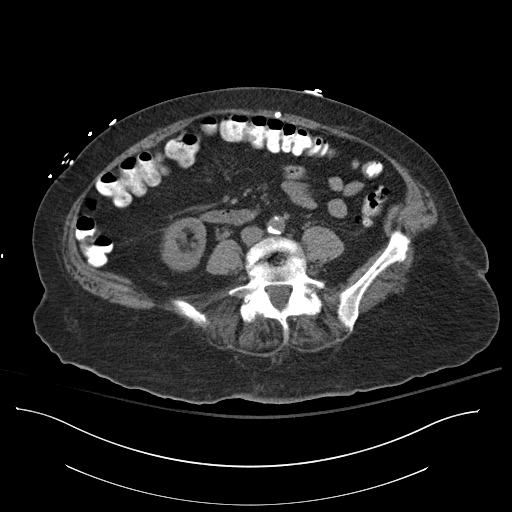
[im 51/87  soft-tissue]
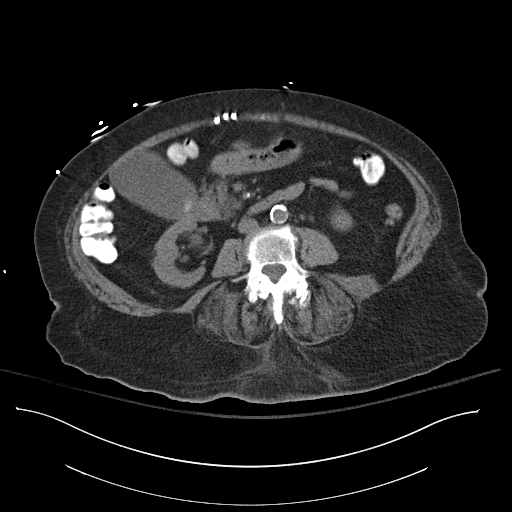
[im 56/87  soft-tissue]
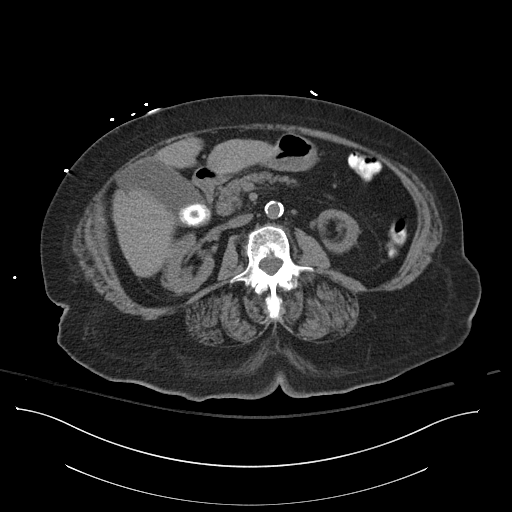
[im 56/87  bone]
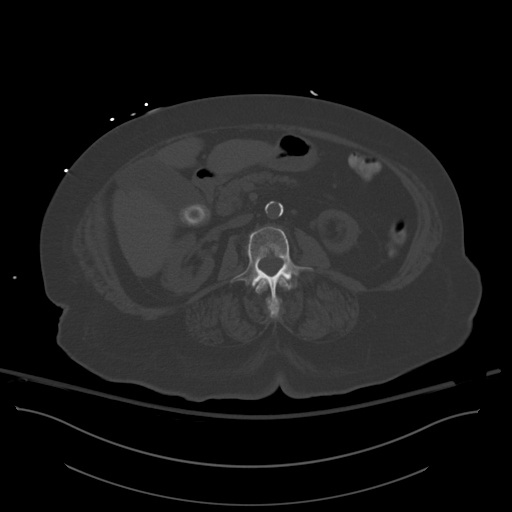
[im 61/87  soft-tissue]
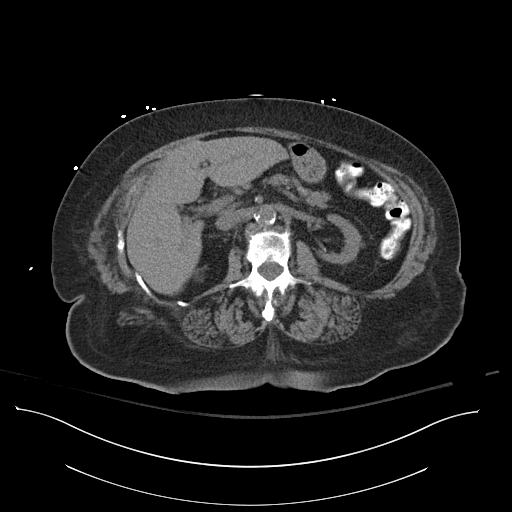
[im 66/87  soft-tissue]
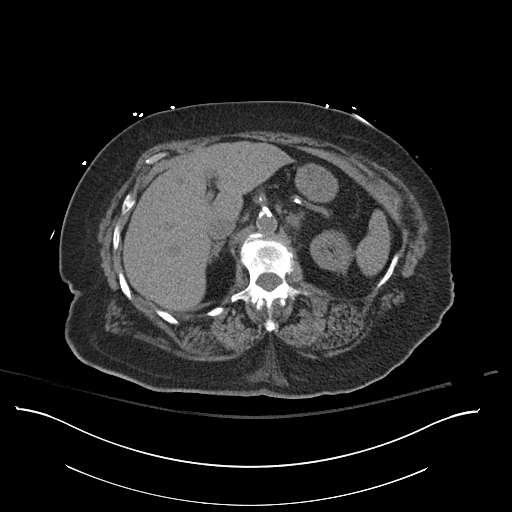
[im 76/87  soft-tissue]
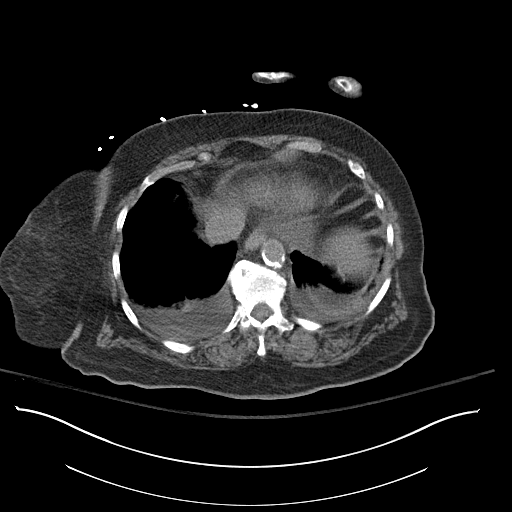
[im 81/87  soft-tissue]
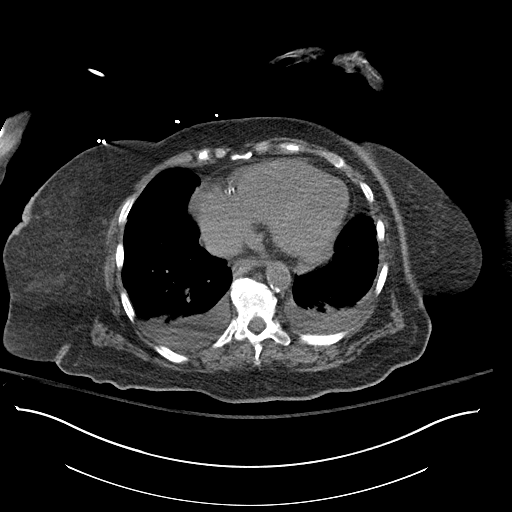

[Series 5: coronal st · coronal · 0.78mm/px · 3 of 101 slices shown]
[im 34/101  soft-tissue]
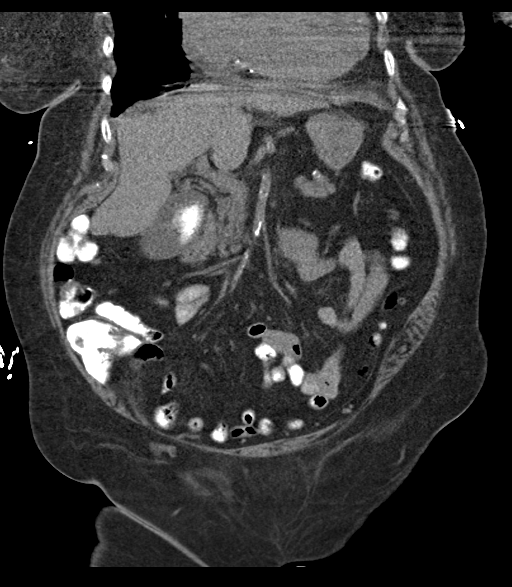
[im 45/101  soft-tissue]
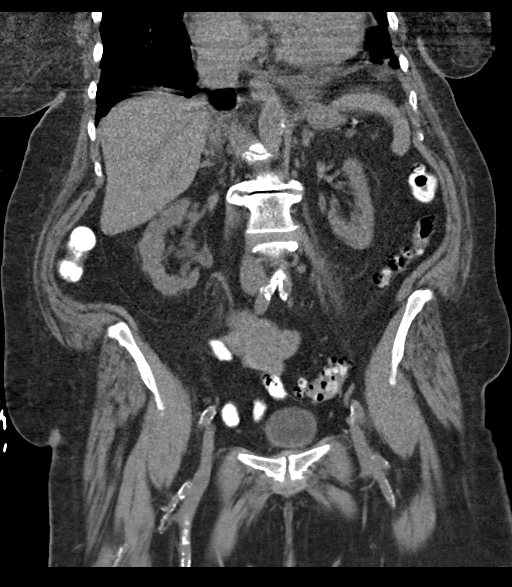
[im 56/101  soft-tissue]
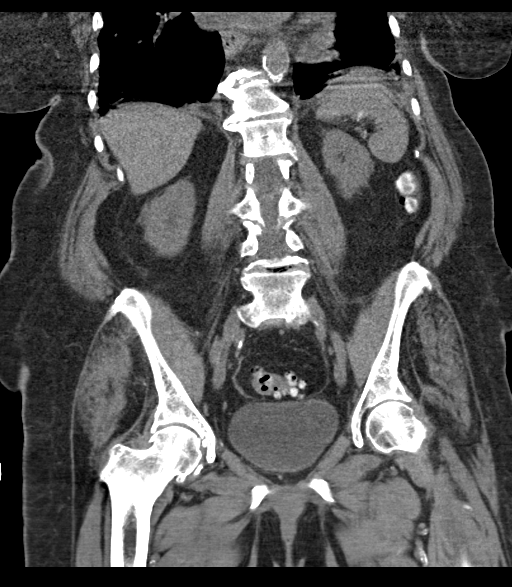

[16 of 46 positions shown; findings below may reference images not displayed]

FINDINGS: LOWER CHEST: Small bilateral pleural effusions. Included heart is
enlarged. Trace pericardial effusion.

HEPATOBILIARY: Distended gallbladder with 2.6 cm gallstone. Negative
noncontrast CT liver.

PANCREAS: Normal.

SPLEEN: Normal.

ADRENALS/URINARY TRACT: Kidneys are orthotopic, demonstrating normal
size and morphology. No nephrolithiasis, hydronephrosis; limited
assessment for renal masses on this nonenhanced examination. RIGHT
pelviectasis. The unopacified ureters are normal in course and
caliber. Urinary bladder is partially distended and unremarkable.
Normal adrenal glands.

STOMACH/BOWEL: The stomach, small and large bowel are normal in
course and caliber. Severe descending and sigmoid colonic
diverticulosis. Ascending and transverse colon air contrast levels.
Normal appendix.

VASCULAR/LYMPHATIC: Aortoiliac vessels are normal in course and
caliber. Moderate to severe calcific atherosclerosis. No
lymphadenopathy by CT size criteria.

REPRODUCTIVE: Status post hysterectomy.

OTHER: No intraperitoneal free fluid or free air.

MUSCULOSKELETAL: Non-acute. Status post anterior abdominal wall
herniorrhaphy. Moderate sacroiliac osteoarthrosis. Severe lower
lumbar facet arthropathy. Grade 1 L4-5 anterolisthesis without
spondylolysis. Moderate to severe L4-5 neural foraminal narrowing.
IMPRESSION: 1. Cholelithiasis and gallbladder distension without additional
findings of acute cholecystitis.
2. Colonic air contrast levels, possible enteritis. Colonic
diverticulosis.
3. Cardiomegaly and small pleural effusions.

Aortic Atherosclerosis ([id number].H).

## 2019-04-25 IMAGING — DX DG CHEST 2V
2 series · 2 of 2 positions shown · non-contrast
Comparison: 07/14/2017 and 08/19/2009

CLINICAL DATA: 88-year-old with dyspnea.

EXAM:
CHEST - 2 VIEW

[chest lat]
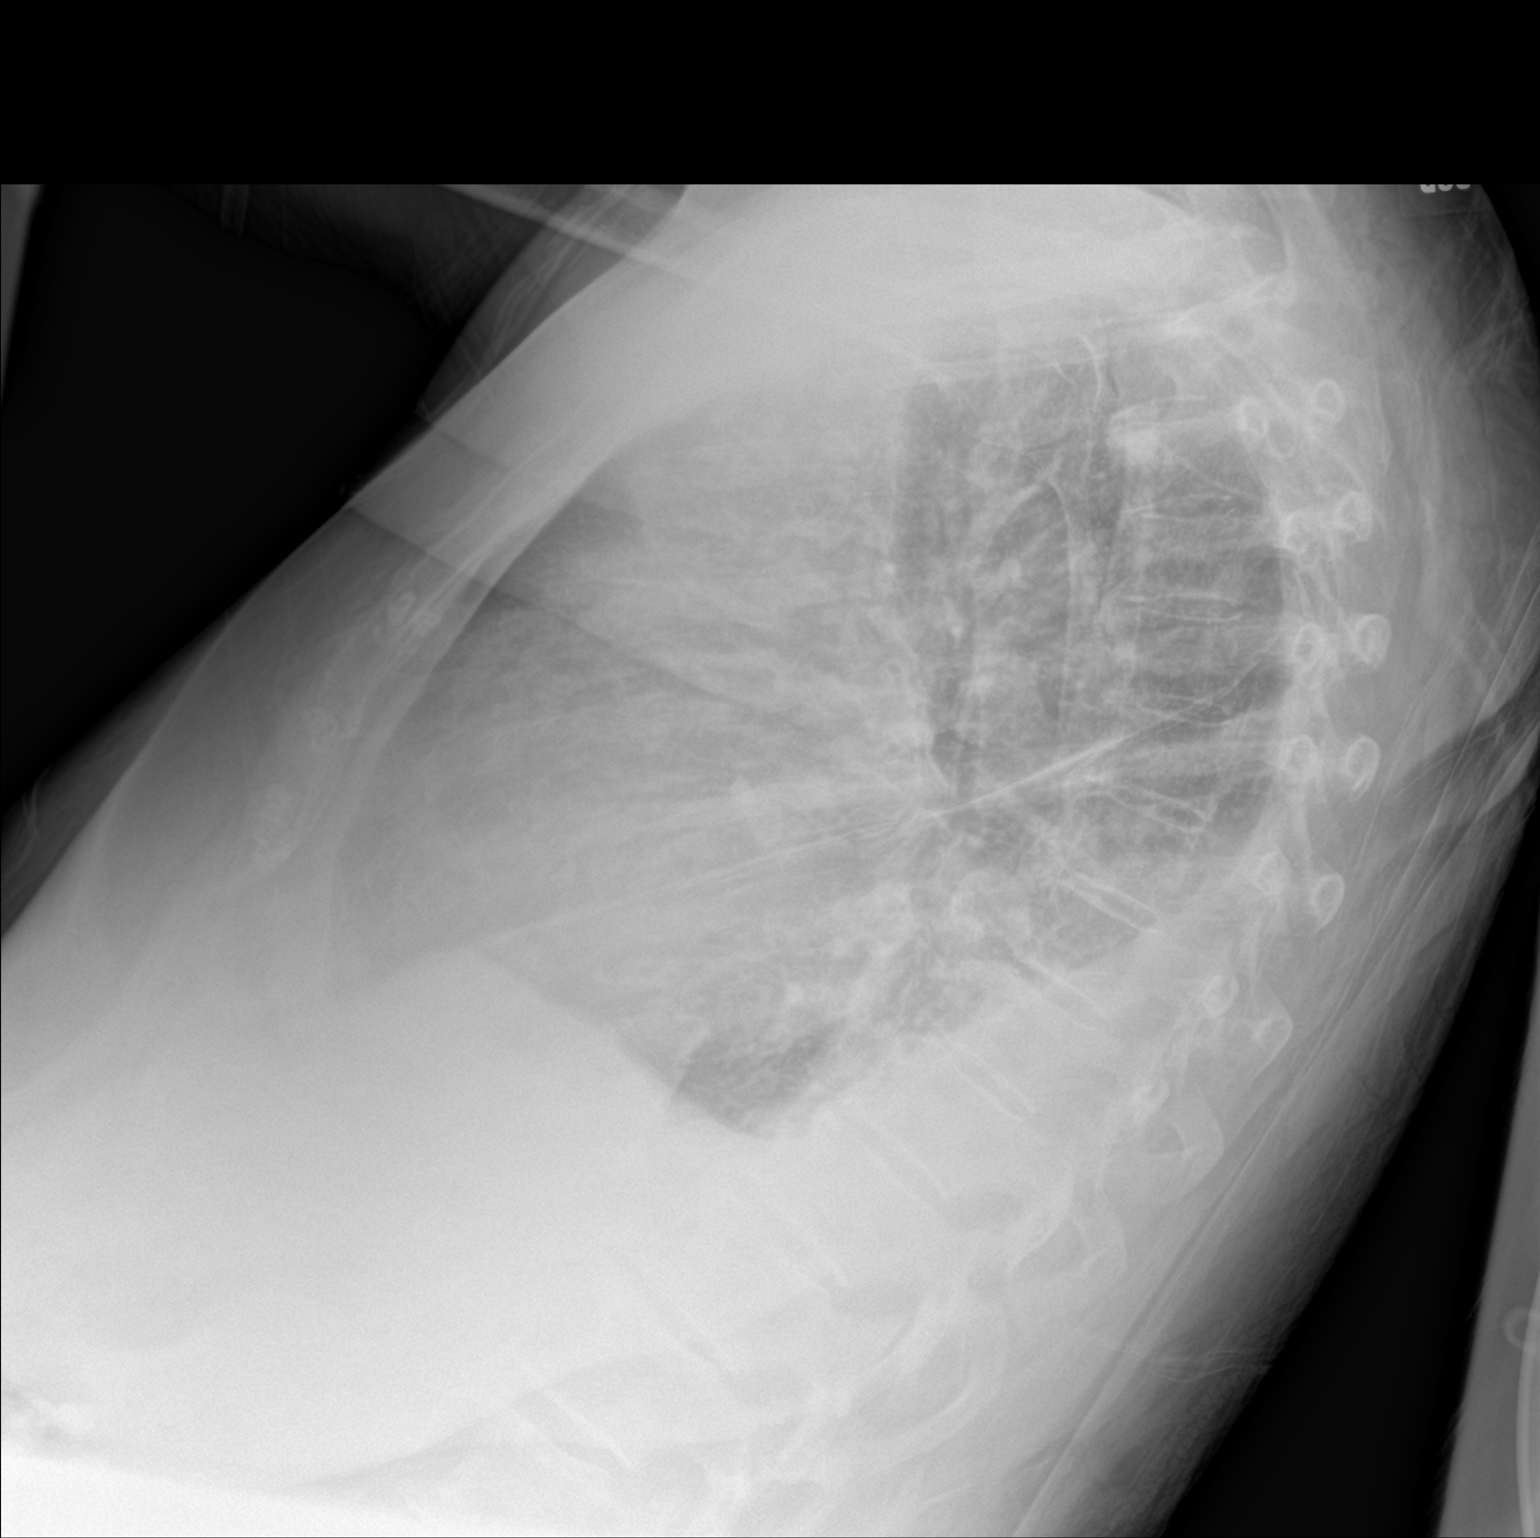

[chest ap]
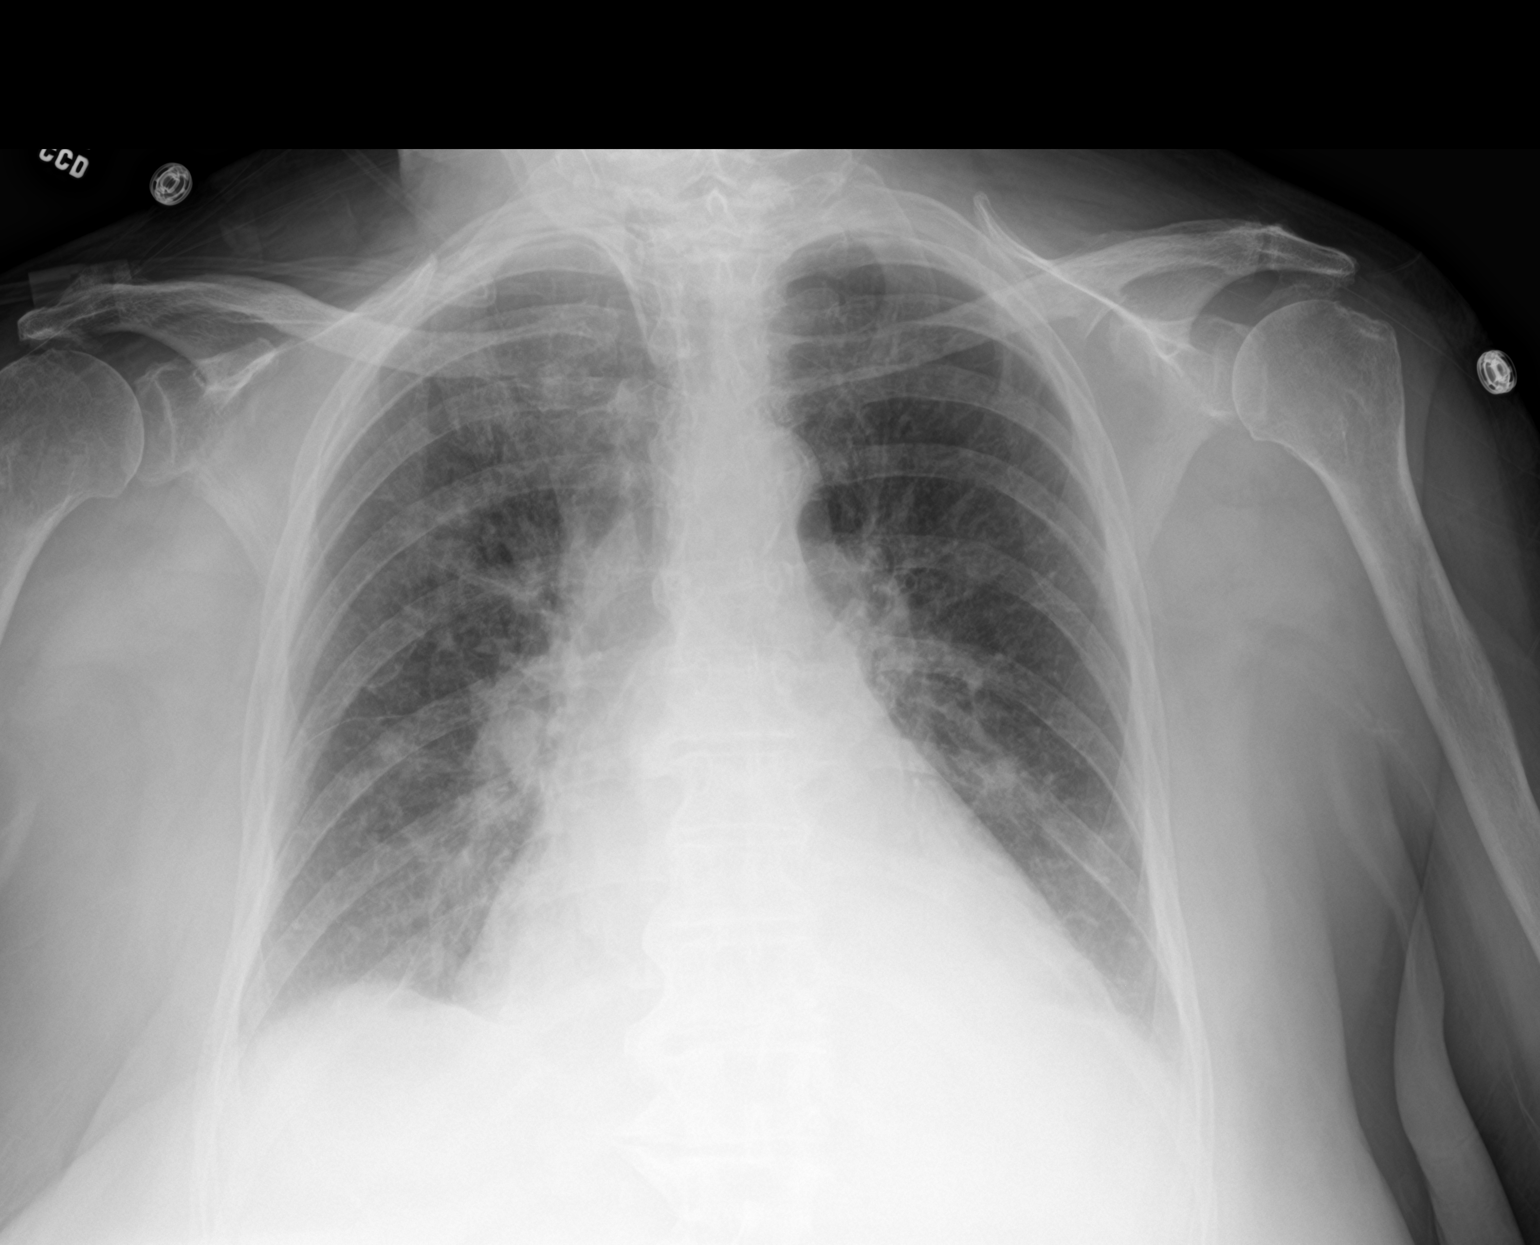

[2 of 2 positions shown; findings below may reference images not displayed]

FINDINGS: Basilar densities are compatible with small bilateral pleural
effusions. The effusions appear larger compared to the recent
comparison examination. In addition, there is fluid or thickening in
the lung fissures. Prominent interstitial lung densities, right side
on greater than left. Heart size remains mildly enlarged.
Atherosclerotic calcifications at the aortic arch.
IMPRESSION: Enlarging small bilateral pleural effusions.

Cardiomegaly with asymmetric vascular congestion or mild pulmonary
edema. Difficult to exclude atypical infection in the right lung.
